# Patient Record
Sex: Female | Born: 1935 | Race: Black or African American | Hispanic: No | Smoking: Former smoker
Health system: Southern US, Community
[De-identification: ages and names within clinical notes are randomized; demographics above are authoritative.]

## PROBLEM LIST (undated history)

## (undated) DIAGNOSIS — F0390 Unspecified dementia without behavioral disturbance: Secondary | ICD-10-CM

## (undated) DIAGNOSIS — E876 Hypokalemia: Secondary | ICD-10-CM

## (undated) DIAGNOSIS — C3490 Malignant neoplasm of unspecified part of unspecified bronchus or lung: Secondary | ICD-10-CM

## (undated) DIAGNOSIS — M1990 Unspecified osteoarthritis, unspecified site: Secondary | ICD-10-CM

## (undated) DIAGNOSIS — I340 Nonrheumatic mitral (valve) insufficiency: Secondary | ICD-10-CM

## (undated) DIAGNOSIS — K5790 Diverticulosis of intestine, part unspecified, without perforation or abscess without bleeding: Secondary | ICD-10-CM

## (undated) DIAGNOSIS — I420 Dilated cardiomyopathy: Secondary | ICD-10-CM

## (undated) DIAGNOSIS — E785 Hyperlipidemia, unspecified: Secondary | ICD-10-CM

## (undated) DIAGNOSIS — I10 Essential (primary) hypertension: Secondary | ICD-10-CM

## (undated) DIAGNOSIS — I509 Heart failure, unspecified: Secondary | ICD-10-CM

## (undated) DIAGNOSIS — I2510 Atherosclerotic heart disease of native coronary artery without angina pectoris: Secondary | ICD-10-CM

## (undated) DIAGNOSIS — J309 Allergic rhinitis, unspecified: Secondary | ICD-10-CM

## (undated) DIAGNOSIS — R918 Other nonspecific abnormal finding of lung field: Secondary | ICD-10-CM

## (undated) DIAGNOSIS — M8580 Other specified disorders of bone density and structure, unspecified site: Secondary | ICD-10-CM

## (undated) HISTORY — PX: ABDOMINAL HYSTERECTOMY: SHX81

## (undated) HISTORY — PX: APPENDECTOMY: SHX54

## (undated) HISTORY — DX: Allergic rhinitis, unspecified: J30.9

## (undated) HISTORY — PX: TONSILLECTOMY: SUR1361

## (undated) HISTORY — DX: Dilated cardiomyopathy: I42.0

## (undated) HISTORY — DX: Other specified disorders of bone density and structure, unspecified site: M85.80

## (undated) HISTORY — DX: Nonrheumatic mitral (valve) insufficiency: I34.0

## (undated) HISTORY — DX: Diverticulosis of intestine, part unspecified, without perforation or abscess without bleeding: K57.90

## (undated) HISTORY — PX: EYE SURGERY: SHX253

## (undated) HISTORY — DX: Hypokalemia: E87.6

## (undated) HISTORY — PX: KNEE ARTHROSCOPY: SUR90

## (undated) HISTORY — DX: Atherosclerotic heart disease of native coronary artery without angina pectoris: I25.10

---

## 1999-07-23 ENCOUNTER — Encounter: Payer: Self-pay | Admitting: Emergency Medicine

## 1999-07-23 ENCOUNTER — Emergency Department (HOSPITAL_COMMUNITY): Admission: EM | Admit: 1999-07-23 | Discharge: 1999-07-23 | Payer: Self-pay | Admitting: Emergency Medicine

## 2001-11-17 ENCOUNTER — Encounter: Admission: RE | Admit: 2001-11-17 | Discharge: 2002-02-15 | Payer: Self-pay | Admitting: Family Medicine

## 2002-07-19 ENCOUNTER — Encounter: Payer: Self-pay | Admitting: Orthopaedic Surgery

## 2002-07-19 ENCOUNTER — Encounter: Admission: RE | Admit: 2002-07-19 | Discharge: 2002-07-19 | Payer: Self-pay | Admitting: Orthopaedic Surgery

## 2002-07-20 ENCOUNTER — Ambulatory Visit (HOSPITAL_BASED_OUTPATIENT_CLINIC_OR_DEPARTMENT_OTHER): Admission: RE | Admit: 2002-07-20 | Discharge: 2002-07-20 | Payer: Self-pay | Admitting: Orthopaedic Surgery

## 2005-01-09 ENCOUNTER — Ambulatory Visit: Payer: Self-pay | Admitting: Gastroenterology

## 2005-01-23 ENCOUNTER — Ambulatory Visit: Payer: Self-pay | Admitting: Gastroenterology

## 2008-01-22 ENCOUNTER — Inpatient Hospital Stay (HOSPITAL_COMMUNITY): Admission: AD | Admit: 2008-01-22 | Discharge: 2008-01-28 | Payer: Self-pay | Admitting: Cardiology

## 2008-02-22 ENCOUNTER — Encounter: Admission: RE | Admit: 2008-02-22 | Discharge: 2008-02-22 | Payer: Self-pay | Admitting: Internal Medicine

## 2008-03-16 ENCOUNTER — Emergency Department (HOSPITAL_COMMUNITY): Admission: EM | Admit: 2008-03-16 | Discharge: 2008-03-17 | Payer: Self-pay | Admitting: Emergency Medicine

## 2008-09-08 ENCOUNTER — Encounter: Admission: RE | Admit: 2008-09-08 | Discharge: 2008-09-08 | Payer: Self-pay | Admitting: Cardiology

## 2008-10-13 DIAGNOSIS — I10 Essential (primary) hypertension: Secondary | ICD-10-CM | POA: Insufficient documentation

## 2008-10-14 ENCOUNTER — Ambulatory Visit: Payer: Self-pay | Admitting: Critical Care Medicine

## 2008-10-14 DIAGNOSIS — J8410 Pulmonary fibrosis, unspecified: Secondary | ICD-10-CM

## 2008-10-14 DIAGNOSIS — E7800 Pure hypercholesterolemia, unspecified: Secondary | ICD-10-CM

## 2008-10-14 DIAGNOSIS — I509 Heart failure, unspecified: Secondary | ICD-10-CM | POA: Insufficient documentation

## 2008-10-14 DIAGNOSIS — E119 Type 2 diabetes mellitus without complications: Secondary | ICD-10-CM | POA: Insufficient documentation

## 2008-10-18 ENCOUNTER — Encounter: Payer: Self-pay | Admitting: Critical Care Medicine

## 2008-10-18 ENCOUNTER — Ambulatory Visit: Payer: Self-pay | Admitting: Critical Care Medicine

## 2008-10-18 ENCOUNTER — Ambulatory Visit: Admission: RE | Admit: 2008-10-18 | Discharge: 2008-10-18 | Payer: Self-pay | Admitting: Critical Care Medicine

## 2008-10-20 ENCOUNTER — Telehealth (INDEPENDENT_AMBULATORY_CARE_PROVIDER_SITE_OTHER): Payer: Self-pay | Admitting: *Deleted

## 2008-10-20 ENCOUNTER — Encounter: Payer: Self-pay | Admitting: Critical Care Medicine

## 2008-10-26 ENCOUNTER — Ambulatory Visit: Payer: Self-pay | Admitting: Critical Care Medicine

## 2008-12-14 ENCOUNTER — Encounter: Payer: Self-pay | Admitting: Critical Care Medicine

## 2008-12-14 ENCOUNTER — Ambulatory Visit: Payer: Self-pay | Admitting: Critical Care Medicine

## 2009-02-27 ENCOUNTER — Encounter: Admission: RE | Admit: 2009-02-27 | Discharge: 2009-02-27 | Payer: Self-pay | Admitting: Internal Medicine

## 2009-03-07 ENCOUNTER — Encounter: Admission: RE | Admit: 2009-03-07 | Discharge: 2009-03-07 | Payer: Self-pay | Admitting: Internal Medicine

## 2009-12-05 ENCOUNTER — Encounter (INDEPENDENT_AMBULATORY_CARE_PROVIDER_SITE_OTHER): Payer: Self-pay | Admitting: *Deleted

## 2009-12-28 ENCOUNTER — Encounter (INDEPENDENT_AMBULATORY_CARE_PROVIDER_SITE_OTHER): Payer: Self-pay | Admitting: *Deleted

## 2010-01-10 ENCOUNTER — Encounter (INDEPENDENT_AMBULATORY_CARE_PROVIDER_SITE_OTHER): Payer: Self-pay | Admitting: *Deleted

## 2010-01-15 ENCOUNTER — Ambulatory Visit: Payer: Self-pay | Admitting: Gastroenterology

## 2010-01-26 ENCOUNTER — Telehealth: Payer: Self-pay | Admitting: Gastroenterology

## 2010-01-26 ENCOUNTER — Ambulatory Visit: Payer: Self-pay | Admitting: Gastroenterology

## 2010-01-30 ENCOUNTER — Encounter: Payer: Self-pay | Admitting: Gastroenterology

## 2010-03-19 ENCOUNTER — Encounter: Admission: RE | Admit: 2010-03-19 | Discharge: 2010-03-19 | Payer: Self-pay | Admitting: Internal Medicine

## 2010-08-03 ENCOUNTER — Ambulatory Visit (HOSPITAL_COMMUNITY): Admission: RE | Admit: 2010-08-03 | Discharge: 2010-08-03 | Payer: Self-pay | Admitting: Pediatrics

## 2011-01-17 NOTE — Procedures (Signed)
Summary: Colonoscopy  Patient: Joan Hunter Note: All result statuses are Final unless otherwise noted.  Tests: (1) Colonoscopy (COL)   COL Colonoscopy           DONE (C)     [facility]     [address]     [place], [place]  [address]           COLONOSCOPY PROCEDURE REPORT           PATIENT:  Joan, Hunter  MR#:  [id number]     BIRTHDATE:  09/26/1927, 82 yrs. old  GENDER:  female           ENDOSCOPIST:  Barbette Hair. Arlyce Dice, MD     Referred by:           PROCEDURE DATE:  01/26/2010     PROCEDURE:  Colonoscopy with snare polypectomy     ASA CLASS:  Class II     INDICATIONS:  history of pre-cancerous (adenomatous) colon polyps,     family history of colon cancer mother           MEDICATIONS:   Fentanyl 50 mcg IV, Versed 5 mg IV           DESCRIPTION OF PROCEDURE:   After the risks benefits and     alternatives of the procedure were thoroughly explained, informed     consent was obtained.  Digital rectal exam was performed and     revealed no abnormalities.   The LB PCF-H180AL [id number] endoscope     was introduced through the anus and advanced to the cecum, which     was identified by the ileocecal valve, without limitations.  The     quality of the prep was excellent, using MoviPrep.  The instrument     was then slowly withdrawn as the colon was fully examined.     <<PROCEDUREIMAGES>>           FINDINGS:  A sessile polyp was found in the descending colon. It     was 1 cm in size. Polyp was snared, then cauterized with monopolar     cautery. Retrieval was successful (see image3). snare polyp     Moderate diverticulosis was found in the sigmoid colon (see     image5).  This was otherwise a normal examination of the colon     (see image1, image2, image4, image6, and image7).   Retroflexed     views in the rectum revealed no abnormalities.    The scope was     then withdrawn from the patient and the procedure completed.           COMPLICATIONS:  None        ENDOSCOPIC IMPRESSION:     1) 1 cm sessile polyp in the descending colon     2) Moderate diverticulosis in the sigmoid colon     3) Otherwise normal examination     RECOMMENDATIONS:     1) Given your significant family history of colon cancer, you     should have a repeat colonoscopy in 5 years           REPEAT EXAM:  In 5 year(s) for Colonoscopy.           ______________________________     Barbette Hair. Arlyce Dice, MD           CC:  Renford Dills, MD (correction)           n.  REVISED:  01/31/2010 04:35 PM     eSIGNED:   Barbette Hair. Keigan Girten at 01/31/2010 04:35 PM           Chrisandra Netters, [id number]  Note: An exclamation mark (!) indicates a result that was not dispersed into the flowsheet. Document Creation Date: 01/31/2010 4:35 PM _______________________________________________________________________  (1) Order result status: Final Collection or observation date-time: 01/26/2010 10:11 Requested date-time:  Receipt date-time:  Reported date-time:  Referring Physician:   Ordering Physician: Melvia Heaps [phone]) Specimen Source:  Source: Launa Grill Order Number: [id number] Lab site:

## 2011-01-17 NOTE — Letter (Signed)
Summary: Patient Notice- Polyp Results  [facility]  [address] [place], [place] [address]   Phone: [phone]  Fax: [phone]        January 30, 2010 MRN: [id number]    Joan Hunter [address] [place], [place]  [address]    Dear Ms. Fulgham,  I am pleased to inform you that the colon polyp(s) removed during your recent colonoscopy was (were) found to be benign (no cancer detected) upon pathologic examination.  I recommend you have a repeat colonoscopy examination in _5 years to look for recurrent polyps, as having colon polyps increases your risk for having recurrent polyps or even colon cancer in the future.  Should you develop new or worsening symptoms of abdominal pain, bowel habit changes or bleeding from the rectum or bowels, please schedule an evaluation with either your primary care physician or with me.  Additional information/recommendations:  __ No further action with gastroenterology is needed at this time. Please      follow-up with your primary care physician for your other healthcare      needs.  __ Please call [phone] to schedule a return visit to review your      situation.  __ Please keep your follow-up visit as already scheduled.  _x_ Continue treatment plan as outlined the day of your exam.  Please call us if you are having persistent problems or have questions about your condition that have not been fully answered at this time.  Sincerely,  Louis Meckel MD  This letter has been electronically signed by your physician.  Appended Document: Patient Notice- Polyp Results Letter mailed 2.17.11

## 2011-01-17 NOTE — Letter (Signed)
Summary: [facility] Instructions  [facility]  [address] [place], [place] [address]   Phone: [phone]  Fax: [phone]       Joan Hunter    24-May-1927    MRN: [id number]        Procedure Day /Date:  Friday 01/26/2010     Arrival Time:  8:30 am      Procedure Time: 9:30 am     Location of Procedure:                    _x _  [facility] (4th Floor)                        PREPARATION FOR COLONOSCOPY WITH MOVIPREP   Starting 5 days prior to your procedure 2/6 Sunday do not eat nuts, seeds, popcorn, corn, beans, peas,  salads, or any raw vegetables.  Do not take any fiber supplements (e.g. Metamucil, Citrucel, and Benefiber).  THE DAY BEFORE YOUR PROCEDURE         DATE: Thursday 2/10  1.  Drink clear liquids the entire day-NO SOLID FOOD  2.  Do not drink anything colored red or purple.  Avoid juices with pulp.  No orange juice.  3.  Drink at least 64 oz. (8 glasses) of fluid/clear liquids during the day to prevent dehydration and help the prep work efficiently.  CLEAR LIQUIDS INCLUDE: Water Jello Ice Popsicles Tea (sugar ok, no milk/cream) Powdered fruit flavored drinks Coffee (sugar ok, no milk/cream) Gatorade Juice: apple, white grape, white cranberry  Lemonade Clear bullion, consomm, broth Carbonated beverages (any kind) Strained chicken noodle soup Hard Candy                             4.  In the morning, mix first dose of MoviPrep solution:    Empty 1 Pouch A and 1 Pouch B into the disposable container    Add lukewarm drinking water to the top line of the container. Mix to dissolve    Refrigerate (mixed solution should be used within 24 hrs)  5.  Begin drinking the prep at 5:00 p.m. The MoviPrep container is divided by 4 marks.   Every 15 minutes drink the solution down to the next mark (approximately 8 oz) until the full liter is complete.   6.  Follow completed prep with 16 oz of clear liquid of your choice  (Nothing red or purple).  Continue to drink clear liquids until bedtime.  7.  Before going to bed, mix second dose of MoviPrep solution:    Empty 1 Pouch A and 1 Pouch B into the disposable container    Add lukewarm drinking water to the top line of the container. Mix to dissolve    Refrigerate  THE DAY OF YOUR PROCEDURE      DATE: Friday 2/11  Beginning at 4:30 a.m. (5 hours before procedure):         1. Every 15 minutes, drink the solution down to the next mark (approx 8 oz) until the full liter is complete.  2. Follow completed prep with 16 oz. of clear liquid of your choice.    3. You may drink clear liquids until 7:30 am (2 HOURS BEFORE PROCEDURE).   MEDICATION INSTRUCTIONS  Unless otherwise instructed, you should take regular prescription medications with a small sip of water   as early as possible the morning  of your procedure.  Diabetic patients - see separate instructions.  Additional medication instructions: Hold Furosemide the day of the procedure.         OTHER INSTRUCTIONS  You will need a responsible adult at least 75 years of age to accompany you and drive you home.   This person must remain in the waiting room during your procedure.  Wear loose fitting clothing that is easily removed.  Leave jewelry and other valuables at home.  However, you may wish to bring a book to read or  an iPod/MP3 player to listen to music as you wait for your procedure to start.  Remove all body piercing jewelry and leave at home.  Total time from sign-in until discharge is approximately 2-3 hours.  You should go home directly after your procedure and rest.  You can resume normal activities the  day after your procedure.  The day of your procedure you should not:   Drive   Make legal decisions   Operate machinery   Drink alcohol   Return to work  You will receive specific instructions about eating, activities and medications before you leave.    The above  instructions have been reviewed and explained to me by   Ezra Sites RN  January 15, 2010 4:13 PM    I fully understand and can verbalize these instructions _____________________________ Date _________

## 2011-01-17 NOTE — Letter (Signed)
Summary: Previsit letter  [facility]  [address] [place], [place] [address]   Phone: [phone]  Fax: [phone]       12/28/2009 MRN: [id number]  Joan Hunter [address] [place], [place]  [address]  Dear [name],  Welcome to the Gastroenterology Division at [facility].    You are scheduled to see a nurse for your pre-procedure visit on 01-15-10 at 4:00p.m. on the 3rd floor at [facility], [address]  We ask that you try to arrive at our office 15 minutes prior to your appointment time to allow for check-in.  Your nurse visit will consist of discussing your medical and surgical history, your immediate family medical history, and your medications.    Please bring a complete list of all your medications or, if you prefer, bring the medication bottles and we will list them.  We will need to be aware of both prescribed and over the counter drugs.  We will need to know exact dosage information as well.  If you are on blood thinners (Coumadin, Plavix, Aggrenox, Ticlid, etc.) please call our office today/prior to your appointment, as we need to consult with your physician about holding your medication.   Please be prepared to read and sign documents such as consent forms, a financial agreement, and acknowledgement forms.  If necessary, and with your consent, a friend or relative is welcome to sit-in on the nurse visit with you.  Please bring your insurance card so that we may make a copy of it.  If your insurance requires a referral to see a specialist, please bring your referral form from your primary care physician.  No co-pay is required for this nurse visit.     If you cannot keep your appointment, please call [phone] to cancel or reschedule prior to your appointment date.  This allows [place] the opportunity to schedule an appointment for another patient in need of care.    Thank you for choosing [facility] for your medical  needs.  We appreciate the opportunity to care for you.  Please visit [place] at our website  to learn more about our practice.                     Sincerely.                                                                                                                   The Gastroenterology Division

## 2011-01-17 NOTE — Procedures (Signed)
Summary: Colonoscopy  Patient: Joan Hunter Note: All result statuses are Final unless otherwise noted.  Tests: (1) Colonoscopy (COL)   COL Colonoscopy           DONE     [facility]     [address]     [place], [place]  [address]           COLONOSCOPY PROCEDURE REPORT           PATIENT:  Joan Hunter, Joan Hunter  MR#:  [id number]     BIRTHDATE:  [birth year], 82 yrs. old  GENDER:  female           ENDOSCOPIST:  Barbette Hair. Arlyce Dice, MD     Referred by:           PROCEDURE DATE:  01/26/2010     PROCEDURE:  Colonoscopy with snare polypectomy     ASA CLASS:  Class II     INDICATIONS:  history of pre-cancerous (adenomatous) colon polyps,     family history of colon cancer mother           MEDICATIONS:   Fentanyl 50 mcg IV, Versed 5 mg IV           DESCRIPTION OF PROCEDURE:   After the risks benefits and     alternatives of the procedure were thoroughly explained, informed     consent was obtained.  Digital rectal exam was performed and     revealed no abnormalities.   The LB PCF-H180AL [id number] endoscope     was introduced through the anus and advanced to the cecum, which     was identified by the ileocecal valve, without limitations.  The     quality of the prep was excellent, using MoviPrep.  The instrument     was then slowly withdrawn as the colon was fully examined.     <<PROCEDUREIMAGES>>           FINDINGS:  A sessile polyp was found in the descending colon. It     was 1 cm in size. Polyp was snared, then cauterized with monopolar     cautery. Retrieval was successful (see image3). snare polyp     Moderate diverticulosis was found in the sigmoid colon (see     image5).  This was otherwise a normal examination of the colon     (see image1, image2, image4, image6, and image7).   Retroflexed     views in the rectum revealed no abnormalities.    The scope was     then withdrawn from the patient and the procedure completed.           COMPLICATIONS:  None        ENDOSCOPIC IMPRESSION:     1) 1 cm sessile polyp in the descending colon     2) Moderate diverticulosis in the sigmoid colon     3) Otherwise normal examination     RECOMMENDATIONS:     1) Given your significant family history of colon cancer, you     should have a repeat colonoscopy in 5 years           REPEAT EXAM:  In 5 year(s) for Colonoscopy.           ______________________________     Barbette Hair. Arlyce Dice, MD           CC:  Clyda Greener, MD           n.     eSIGNED:  Barbette Hair. Shelia Magallon at 01/26/2010 10:16 AM           Chrisandra Netters, [id number]  Note: An exclamation mark (!) indicates a result that was not dispersed into the flowsheet. Document Creation Date: 01/26/2010 10:16 AM _______________________________________________________________________  (1) Order result status: Final Collection or observation date-time: 01/26/2010 10:11 Requested date-time:  Receipt date-time:  Reported date-time:  Referring Physician:   Ordering Physician: Melvia Heaps [phone]) Specimen Source:  Source: Launa Grill Order Number: [id number] Lab site:   Appended Document: Colonoscopy     Procedures Next Due Date:    Colonoscopy: 01/2015  Appended Document: Colonoscopy CC: Lajean Manes MD

## 2011-01-17 NOTE — Letter (Signed)
Summary: Diabetic Instructions  [facility]  [address] [place], [place] [address]   Phone: [phone]  Fax: [phone]    ASHMI BLAS 07-21-1927 MRN: [id number]   _  _   ORAL DIABETIC MEDICATION INSTRUCTIONS  The day before your procedure:   Take your diabetic pill as you do normally  The day of your procedure:   Do not take your diabetic pill    We will check your blood sugar levels during the admission process and again in Recovery before discharging you home  ________________________________________________________________________

## 2011-01-17 NOTE — Progress Notes (Signed)
Summary: Wrong PCP on her paperwork  Phone Note Call from Patient Call back at Home Phone [phone]   Call For: Dr Arlyce Dice Summary of Call: We have the wrong Doctor listed as her primry care on all her discharge paperwork. It really is Dr Renford Dills. Initial call taken by: Leanor Kail [facility],  January 26, 2010 12:54 PM  Follow-up for Phone Call        Spoke with pt and stated that paperwork would be corrected. Follow-up by: Karl Bales RN,  January 26, 2010 12:57 PM

## 2011-01-17 NOTE — Letter (Signed)
Summary: Patient Notice- Polyp Results  [facility]  [address] [place], [place] [address]   Phone: [phone]  Fax: [phone]        January 30, 2010 MRN: [id number]    Joan Hunter [address] [place], [place]  [address]    Dear Ms. Teagle,  I am pleased to inform you that the colon polyp(s) removed during your recent colonoscopy was (were) found to be benign (no cancer detected) upon pathologic examination.  I recommend you have a repeat colonoscopy examination in 5_ years to look for recurrent polyps, as having colon polyps increases your risk for having recurrent polyps or even colon cancer in the future.  Should you develop new or worsening symptoms of abdominal pain, bowel habit changes or bleeding from the rectum or bowels, please schedule an evaluation with either your primary care physician or with me.  Additional information/recommendations:  __ No further action with gastroenterology is needed at this time. Please      follow-up with your primary care physician for your other healthcare      needs.  __ Please call [phone] to schedule a return visit to review your      situation.  __ Please keep your follow-up visit as already scheduled.  _x_ Continue treatment plan as outlined the day of your exam.  Please call us if you are having persistent problems or have questions about your condition that have not been fully answered at this time.  Sincerely,  Louis Meckel MD  This letter has been electronically signed by your physician.

## 2011-01-17 NOTE — Miscellaneous (Signed)
Summary: [facility]  Clinical Lists Changes  Medications: Added new medication of MOVIPREP 100 GM  SOLR (PEG-KCL-NACL-NASULF-NA ASC-C) As per prep instructions. - Signed Rx of MOVIPREP 100 GM  SOLR (PEG-KCL-NACL-NASULF-NA ASC-C) As per prep instructions.;  #1 x 0;  Signed;  Entered by: Ezra Sites RN;  Authorized by: Louis Meckel MD;  Method used: Electronically to [facility] [id number]*, [address] [place], [place]  [address], Ph: [phone], Fax: [phone] Allergies: Changed allergy or adverse reaction from PCN to PCN Changed allergy or adverse reaction from * AVELOX to * AVELOX Changed allergy or adverse reaction from PREDNISONE to PREDNISONE    Prescriptions: MOVIPREP 100 GM  SOLR (PEG-KCL-NACL-NASULF-NA ASC-C) As per prep instructions.  #1 x 0   Entered by:   Ezra Sites RN   Authorized by:   Louis Meckel MD   Signed by:   Ezra Sites RN on 01/15/2010   Method used:   Electronically to        [facility] [phone]* (retail)       [address]       [place], [place]  [address]       Ph: [phone]       Fax: [phone]   RxID:   [id number]

## 2011-03-06 LAB — GLUCOSE, CAPILLARY: Glucose-Capillary: 140 mg/dL — ABNORMAL HIGH (ref 70–99)

## 2011-03-26 ENCOUNTER — Other Ambulatory Visit: Payer: Self-pay | Admitting: Internal Medicine

## 2011-03-26 DIAGNOSIS — Z1231 Encounter for screening mammogram for malignant neoplasm of breast: Secondary | ICD-10-CM

## 2011-04-02 ENCOUNTER — Ambulatory Visit
Admission: RE | Admit: 2011-04-02 | Discharge: 2011-04-02 | Disposition: A | Discharge status: Home / Self Care | Payer: Medicare Other | Source: Ambulatory Visit | Attending: Internal Medicine | Admitting: Internal Medicine

## 2011-04-02 DIAGNOSIS — Z1231 Encounter for screening mammogram for malignant neoplasm of breast: Secondary | ICD-10-CM

## 2011-04-30 NOTE — Discharge Summary (Signed)
Joan Hunter, Joan Hunter NO.:  [id number]   MEDICAL RECORD NO.:  [id number]          PATIENT TYPE:  INP   LOCATION:  3708                         FACILITY:  [facility]   PHYSICIAN:  Jake Bathe, MD      DATE OF BIRTH:  1927-05-03   DATE OF ADMISSION:  01/22/2008  DATE OF DISCHARGE:  01/28/2008                               DISCHARGE SUMMARY   DISCHARGE DIAGNOSES:  1. Chest pain, resolved.  2. Nonobstructive coronary artery disease.  3. Severe left ventricular dysfunction, nonischemic in nature,      ejection fraction 15%.  4. Renal insufficiency.  5. Diabetes mellitus.  6. Hypertension.  7. Left knee arthritis.  8. Allergy to penicillin.  9. Long-term medication use.   HISTORY:  [name] is a 77 year old female with a history of  diabetes, hypertension who has noted increased shortness of breath so  much to the point that she has to sleep sitting up in chair, consistent  with New York Heart Association class III symptoms. She was then  admitted to [facility] with acute systolic heart failure.  An echo in  the office just prior to her admission showed globally decreased  ejection fraction with an EF of approximately 20% with moderate mitral  regurgitation.   She was aggressively diuresed with Lasix intravenously.   Lab studies during her hospital stay include TSH 1.702, total  cholesterol 184, triglycerides 99, HDL 25, LDL 139. Cardiac enzymes  showed normal CK-MB's with a maximum troponin of 0.14 which can be  consistent with volume overload. Sodium 133, potassium 4.5, BUN 35,  creatinine 1.24. White count 11.5, hemoglobin 12.7, hematocrit 30.2,  platelets 352. Chest x-ray:  Cardiomegaly with improved pulmonary venous  congestion suspicious prior to her discharge. Her EKG showed sinus  tachycardia rate 102 with T-wave inversion in the lateral leads.   She ultimately underwent cardiac catheterization on January 25, 2008 and  was found to have the  following:  She had a mid 80% stenosis, RCA mid  20% stenosis. Renal angiogram showed no renal artery stenosis. Aortogram  with no aneurysm. LV gram showed global hypokinesis with an EF of 15-  20%.  The patient was felt to have severe LV dysfunction out of  proportion to her coronary artery disease.   PLAN:  Medical management.  The patient's medications were adjusted and  by January 28, 2008 the patient was felt to be ready for discharge to  home.   DISCHARGE MEDICATIONS:  Baby aspirin daily, Glucotrol 5 mg a day,  lisinopril 10 mg twice a day, potassium 10 mEq a day, Lopressor 100 mg  twice a day, Zocor 10 mg daily, Lasix 20 mg a day.   Remain on a low-sodium heart-healthy diet.  Follow up with Dr. Anne Fu  with lab work on February 03, 2008 at 11:30 a.m.  She showed interest in  becoming a patient of the [facility] internal medicine clinic and I have  given her the phone number to schedule an appointment.   Of note, we have asked the patient to stop taking Norvasc.      Joan Hunter  Joan Hunter.      Jake Bathe, MD  Electronically Signed    LB/MEDQ  D:  02/11/2008  T:  02/12/2008  Job:  [id number]   cc:   Jake Bathe, MD  [facility] Internal Medicine

## 2011-04-30 NOTE — Cardiovascular Report (Signed)
NAMEJANEYA, DEYO NO.:  [id number]   MEDICAL RECORD NO.:  [id number]          PATIENT TYPE:  INP   LOCATION:  3708                         FACILITY:  [facility]   PHYSICIAN:  Jake Bathe, MD      DATE OF BIRTH:  [birth year]   DATE OF PROCEDURE:  01/25/2008  DATE OF DISCHARGE:                            CARDIAC CATHETERIZATION   PROCEDURES:  1. Left heart catheterization.  2. Selective coronary angiography.  3. Left ventriculogram.  4. Abdominal aortogram.   INDICATIONS:  A 77 year old female with acute systolic heart failure,  newly diagnosed.  Cardiac catheterization was done for etiology of heart  failure.  Diabetes and hypertension.   PROCEDURE DETAILS:  Informed consent was obtained by the patient.  Daughter was present.  Risks of stroke, heart attack, and death were  explained at length.  The patient was placed on the catheterization  table, prepped in a sterile fashion.  A 1% lidocaine was used to  infiltrate the right groin for local anesthesia.  Using the modified  Seldinger technique, a 5-French sheath was placed in the right femoral  artery after visualization of the femoral head by fluoroscopy.  A  Judkins left #4 catheter was then selectively cannulated into the left  main artery.  Multiple views with hand injection were obtained with  Omnipaque.  This catheter was exchanged for a Judkins right #4 catheter,  that was used to selectively cannulate the right coronary artery.  Multiple views of Omnipaque were obtained.  Catheter was then exchanged  for an angled pigtail which was used to cross the aortic valve into the  left ventricle.  Hemodynamics were obtained.  A left ventriculogram  utilizing 30 mL of Omnipaque was obtained in the RAO position under  power injection.  Pullback was then obtained.  Hemodynamics recorded.  A  nonselective renal angiogram/abdominal aortogram was performed utilizing  38 mL of dye.  After the procedure was complete,  sheath was removed.  Manual compression held.  No hematoma present.  The patient tolerated  the procedure well, hemodynamically stable.  Findings were discussed  with both patient and her family.   FINDINGS:  1. Left main artery - Large caliber, no significant disease.  2. Left anterior descending artery - There is proximal calcification.      There is one large bifurcating diagonal.  There is 60% ostial LAD      stenosis, as well as 40% LAD stenosis at the bifurcation of the      large diagonal branch .  The LAD then continues to wrap around the      apex.  3. Left circumflex artery - There are two  small obtuse marginal      branches.  The main circumflex artery is a large caliber vessel      with a focal 70% to 80% stenosis in the midsection,  napkin ring.  4. Right coronary artery - This is the dominant vessel giving off the      PDA.  There is mid 20% stenosis.  5. Nonselective renal arteries - There is no evidence of renal artery  stenosis bilaterally.  There is no abdominal aortic aneurysm.  6. Left ventriculogram - There is severe global hypokineses of the      left ventricle with ejection fraction estimated 15% to 20%.  No      significant mitral regurgitation detected, although echo did show      moderate.  Left ventricular pressures were 140 systolic with a left      ventricular end-diastolic pressure of 10 mmHg.  Aortic pressure was      140/82 with a mean of 100 - There was no gradient across the aortic      valve.  Of note, there was beat-to-beat variation in her aortic and      left ventricular pressures significant for pulsus alternans, which      can be seen in severe LV systolic dysfunction.   IMPRESSION:  1. Moderate coronary artery disease with most notable two-vessel      disease - ostial left anterior descending 60% and mid circumflex      70% to 80% stenosis.  2. Severe left ventricular systolic dysfunction with ejection fraction      of 15% to 20% global.   No evidence of thrombus in the apex.  Pulsus      alternans present.  3. No significant renal artery stenosis.   PLAN:  It does appear that her global LV systolic dysfunction is out of  proportion to her coronary artery disease.  I will continue to  aggressively titrate her heart failure medicines.  LVEDP was reassuring.  She was able to lie flat on the catheterization table without problems.  Likely, euvolemic.      Jake Bathe, MD  Electronically Signed     MCS/MEDQ  D:  01/25/2008  T:  01/26/2008  Job:  [id number]   cc:   Thomos Lemons. Lazarus Salines, M.D.

## 2011-04-30 NOTE — H&P (Signed)
NAMETASSIE, POLLETT NO.:  [id number]   MEDICAL RECORD NO.:  [id number]          PATIENT TYPE:  INP   LOCATION:  3708                         FACILITY:  [facility]   PHYSICIAN:  Jake Bathe, MD      DATE OF BIRTH:  Apr 17, 1927   DATE OF ADMISSION:  01/22/2008  DATE OF DISCHARGE:                              HISTORY & PHYSICAL   PRIMARY CARE PHYSICIAN:  Dr. Bruna Potter.   CHIEF COMPLAINT:  Shortness of breath.   HISTORY OF PRESENT ILLNESS:  A 77 year old female with diabetes,  hypertension who since Wednesday has had increased shortness of breath.  Last night had to sit up back to sleep sitting up in a chair.  Has had  New York Heart Association Class III symptoms.  Denies any chest pain,  palpitations, syncope, presyncope, weight gain, edema or recent dietary  indiscretions.  Denies any prior heart history.  She was seen earlier  today by Dr. Lazarus Salines at [facility] and I was asked to evaluate her given  her symptoms.   PAST MEDICAL HISTORY:  1. Diabetes - on glipizide 5 mg daily.  2. Hypertension - on amlodipine 5 mg daily.  3. Left knee osteoarthritis.   ALLERGIES:  PENICILLIN.   MEDICATIONS:  1. Glipizide 5 mg once daily.  2. Amlodipine 5 mg once daily.  3. Aspirin 81 mg once daily.   FAMILY HISTORY:  No early family history of coronary artery disease.  Her brother who was nine years younger than her has died.  He had  coronary artery disease.   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  She denies any tobacco or  alcohol use or illicit drug use.   REVIEW OF SYSTEMS:  Unless explained above, all other 12 review of  systems negative.  She denied any recent bleeding, recent viral  illnesses.  No recent travel.  No recent chest pain.   PHYSICAL EXAMINATION:  VITAL SIGNS:  Weight 156.2 pounds, height 5 feet  6, pulse 132, blood pressure 170/110, on repeat 170/100.  GENERAL:  Alert and  oriented x3, in no acute distress.  Does not appear  to have any labored breathing at rest.  Here with her daughter.  HEENT:  Eyes:  Well perfused conjunctivae status post cataract surgery.  No scleral icterus.  Mucous membranes moist.  NECK:  No carotid bruits.  JVD appreciated to jaw line.  CARDIOVASCULAR:  Tachycardic, regular rhythm.  Laterally displaced PMI.  LUNGS:  Mild crackles heard at bases.  Otherwise, clear.  Kyphosis.  ABDOMEN:  Soft, nontender, normoactive bowel sounds.  No bruits.  EXTREMITIES:  Trace lower extremity edema.  Pulses difficult to palpate,  2+ radial pulses.  NEUROLOGICAL:  Nonfocal, normal gait.  No tremors.  SKIN:  Warm, dry and intact.  No rashes.   LABORATORY DATA:  ECG obtained from Dr. Raye Sorrow encounter demonstrates  sinus tachycardia with marked LVH with repolarization abnormality.  No Q  waves appreciated.  Lab work pending.  Chest x-ray personally reviewed  shows mild cardiomegaly with some increase  in vascular markings.  Mild  failure.  Echocardiogram performed stat in the office shows globally  decreased ejection fraction of approximately 20% with moderate mitral  regurgitation.  Will examine echo in more detail further.   ASSESSMENT/PLAN:  A 77 year old female with diabetes, hypertension and  newly discovered LV systolic dysfunction and acute systolic heart  failure with tachycardia.  1. Acute systolic heart failure.  I will admit on telemetry.  Will      check chemistries and CBC.  Will check TSH, check BNP.  Her weight      currently is 156 pounds.  I will start Furosemide 40 mg IV twice a      day.  Will also start metoprolol 25 mg q.6 h, withhold parameters      given her tachycardia and hypertension.  Depending on her      creatinine, will either start hydralazine or ace inhibitor for      afterload reduction, aspirin 81 mg.  Will eventually, once      compensated, need heart catheterization for etiology of heart      failure.  2.  Hypertension - starting metoprolol and will add antihypertensives      as above.  Possible etiology of heart failure.  3. Diabetes mellitus - will place on insulin sliding scale, monitor      glucoses.      Jake Bathe, MD  Electronically Signed     MCS/MEDQ  D:  01/22/2008  T:  01/22/2008  Job:  [id number]   cc:   Thomos Lemons. Lazarus Salines, M.D.

## 2011-04-30 NOTE — Op Note (Signed)
NAMEFRANCI, OSHANA NO.:  [id number]   MEDICAL RECORD NO.:  [id number]          PATIENT TYPE:  OUT   LOCATION:  CARD                         FACILITY:  [facility]   PHYSICIAN:  Charlcie Cradle. Delford Field, MD, FCCPDATE OF BIRTH:  March 23, 1927   DATE OF PROCEDURE:  10/18/2008  DATE OF DISCHARGE:                               OPERATIVE REPORT   PROCEDURE:  Bronchoscopy.   CHIEF COMPLAINT:  Evaluate bilateral pulmonary infiltrates.   OPERATOR:  Shan Levans, M.D.   ANESTHESIA:  1% Xylocaine local.   PREMEDICATION:  Fentanyl 30 mcg, Versed 3 mg IV push.   PROCEDURE:  The Pentax video bronchoscope was introduced via the right  naris.  The upper airways were visualized and were unremarkable.  The  entire tracheobronchial tree was visualized, showed diffuse  tracheobronchitis but no endobronchial lesions were seen.  Attention was  then paid to the right lower lobe.  Bronchoalveolar lavage x 120 mL was  infused in the right middle lobe.  Approximately 40 mL returned.  Transbronchial biopsies times five were obtained right middle lobe.  Complications were none.   IMPRESSION:  Bilateral pulmonary infiltrates, evaluate for cause.   RECOMMENDATIONS:  Follow up pathology and microbiology.      Charlcie Cradle Delford Field, MD, [facility]  Electronically Signed     PEW/MEDQ  D:  10/18/2008  T:  10/19/2008  Job:  [id number]

## 2011-05-03 NOTE — Op Note (Signed)
NAMEALESSANDRIA, HENKEN NO.:  [id number]   MEDICAL RECORD NO.:  [id number]                   PATIENT TYPE:   LOCATION:                                       FACILITY:   PHYSICIAN:  Lubertha Basque. Jerl Santos, M.D.             DATE OF BIRTH:   DATE OF PROCEDURE:  07/20/2002  DATE OF DISCHARGE:                                 OPERATIVE REPORT   PREOPERATIVE DIAGNOSES:  1. Left knee torn medial meniscus.  2. Left knee degenerative joint disease.   POSTOPERATIVE DIAGNOSES:  1. Left knee torn medial meniscus.  2. Left knee degenerative joint disease.   PROCEDURES:  1. Left knee partial medial meniscectomy.  2. Left knee chondroplasty of patellofemoral.   ANESTHESIA:  General.   ATTENDING SURGEON:  Lubertha Basque. Jerl Santos, M.D.   ASSISTANT:  Prince Rome, P.A.   INDICATION FOR PROCEDURE:  The patient is a 77 year old woman who I have  followed for years for bilateral knee pain.  She is about five or six years  from a right knee arthroscopy and at this point is offered the same  procedure on the left side which had failed various oral anti-  inflammatories, multiple injections, and bracing.  The procedure was  discussed with the patient.  Informed operative procedure was obtained after  a discussion of possible complications, reaction to anesthesia and  infection.   DESCRIPTION OF PROCEDURE:  The patient was seen in the operating suite where  a general anesthetic was applied without difficulty.  She was positioned  supine and prepped and draped in the normal sterile fashion.  After  insertion of preoperative antibiotics, arthroscopy of the left knee was  performed through two inferior portals.  The suprapatellar pouch was benign  while, in the patellofemoral joint did, there was some grade 3 breakdown  centrally which was addressed with a thorough chondroplasty.  The knee cap  actually tracked fairly well.   In the medial compartment, she had a flap  tear of the middle and posterior  horns addressed with a partial medial meniscectomy, taking about 15% of the  structure back to a stable rim.  There were some mild degenerative changes  in the medial compartment addressed with a brief chondroplasty.   The lateral compartment was completely benign with no evidence of meniscal  articular cartilage injury.  The ACL and PCL were intact.  The knee was  thoroughly irrigated at the end of the case followed by placement of  Marcaine with epinephrine and morphine plus Depo-Medrol.  Adaptic was placed  over the portals followed by a dry gauze and a loose Ace wrap.   ESTIMATED BLOOD LOSS AND INTRAOPERATIVE FLUID:  Obtained from the anesthesia  records.   DISPOSITION:  The patient was extubated in the operating room and taken to  the recovery room in stable condition.   PLAN:  Plans were for her to go home the same  day and followup in the office  in one week.  I will contact her by phone tonight.                                               Lubertha Basque Jerl Santos, M.D.    PGD/MEDQ  D:  07/20/2002  T:  07/23/2002  Job:  [id number]

## 2011-08-29 ENCOUNTER — Emergency Department (HOSPITAL_COMMUNITY)
Admission: EM | Admit: 2011-08-29 | Discharge: 2011-08-29 | Disposition: A | Discharge status: Home / Self Care | Payer: Medicare Other | Attending: Emergency Medicine | Admitting: Emergency Medicine

## 2011-08-29 ENCOUNTER — Emergency Department (HOSPITAL_COMMUNITY): Payer: Medicare Other

## 2011-08-29 DIAGNOSIS — W010XXA Fall on same level from slipping, tripping and stumbling without subsequent striking against object, initial encounter: Secondary | ICD-10-CM | POA: Insufficient documentation

## 2011-08-29 DIAGNOSIS — H1130 Conjunctival hemorrhage, unspecified eye: Secondary | ICD-10-CM | POA: Insufficient documentation

## 2011-08-29 DIAGNOSIS — R0989 Other specified symptoms and signs involving the circulatory and respiratory systems: Secondary | ICD-10-CM | POA: Insufficient documentation

## 2011-08-29 DIAGNOSIS — R059 Cough, unspecified: Secondary | ICD-10-CM | POA: Insufficient documentation

## 2011-08-29 DIAGNOSIS — R05 Cough: Secondary | ICD-10-CM | POA: Insufficient documentation

## 2011-08-29 DIAGNOSIS — E119 Type 2 diabetes mellitus without complications: Secondary | ICD-10-CM | POA: Insufficient documentation

## 2011-08-29 DIAGNOSIS — S8000XA Contusion of unspecified knee, initial encounter: Secondary | ICD-10-CM | POA: Insufficient documentation

## 2011-08-29 DIAGNOSIS — I10 Essential (primary) hypertension: Secondary | ICD-10-CM | POA: Insufficient documentation

## 2011-08-29 DIAGNOSIS — S40019A Contusion of unspecified shoulder, initial encounter: Secondary | ICD-10-CM | POA: Insufficient documentation

## 2011-08-29 DIAGNOSIS — Y92009 Unspecified place in unspecified non-institutional (private) residence as the place of occurrence of the external cause: Secondary | ICD-10-CM | POA: Insufficient documentation

## 2011-08-29 DIAGNOSIS — I517 Cardiomegaly: Secondary | ICD-10-CM | POA: Insufficient documentation

## 2011-09-06 LAB — DIFFERENTIAL
Basophils Absolute: 0
Lymphocytes Relative: 11 — ABNORMAL LOW
Lymphs Abs: 1.1
Neutro Abs: 7.7

## 2011-09-06 LAB — BASIC METABOLIC PANEL
BUN: 10
BUN: 12
BUN: 18
BUN: 29 — ABNORMAL HIGH
BUN: 9
CO2: 25
CO2: 26
CO2: 28
CO2: 29
Calcium: 8.3 — ABNORMAL LOW
Calcium: 8.5
Calcium: 8.6
Calcium: 9.2
Chloride: 102
Chloride: 102
Chloride: 102
Chloride: 97
Chloride: 98
Creatinine, Ser: 0.81
Creatinine, Ser: 0.83
Creatinine, Ser: 1.1
Creatinine, Ser: 1.24 — ABNORMAL HIGH
Creatinine, Ser: 1.47 — ABNORMAL HIGH
GFR calc Af Amer: 42 — ABNORMAL LOW
GFR calc Af Amer: 51 — ABNORMAL LOW
GFR calc Af Amer: 58 — ABNORMAL LOW
GFR calc Af Amer: 59 — ABNORMAL LOW
GFR calc non Af Amer: 49 — ABNORMAL LOW
GFR calc non Af Amer: >60
Glucose, Bld: 102 — ABNORMAL HIGH
Glucose, Bld: 109 — ABNORMAL HIGH
Glucose, Bld: 122 — ABNORMAL HIGH
Glucose, Bld: 173 — ABNORMAL HIGH
Potassium: 4.6
Potassium: 4.6
Potassium: 4.7
Sodium: 133 — ABNORMAL LOW
Sodium: 137

## 2011-09-06 LAB — COMPREHENSIVE METABOLIC PANEL
Albumin: 2.8 — ABNORMAL LOW
BUN: 12
CO2: 23
Calcium: 8.3 — ABNORMAL LOW
Chloride: 107
Creatinine, Ser: 0.63
GFR calc non Af Amer: >60
Total Bilirubin: 0.6

## 2011-09-06 LAB — CBC
HCT: 32.5 — ABNORMAL LOW
HCT: 38.2
Hemoglobin: 12.7
MCHC: 33.1
MCHC: 33.5
MCV: 85.5
MCV: 86.1
Platelets: 238
Platelets: 242
RBC: 4.44
RDW: 15.1
RDW: 15.1
WBC: 9.6
WBC: 9.8

## 2011-09-06 LAB — B-NATRIURETIC PEPTIDE (CONVERTED LAB)
Pro B Natriuretic peptide (BNP): 844 — ABNORMAL HIGH
Pro B Natriuretic peptide (BNP): 943 — ABNORMAL HIGH

## 2011-09-06 LAB — CARDIAC PANEL(CRET KIN+CKTOT+MB+TROPI)
Relative Index: INVALID
Total CK: 106
Troponin I: 0.1 — ABNORMAL HIGH
Troponin I: 0.11 — ABNORMAL HIGH
Troponin I: 0.14 — ABNORMAL HIGH

## 2011-09-06 LAB — PROTIME-INR
INR: 1.1
Prothrombin Time: 14.8

## 2011-09-06 LAB — APTT: aPTT: 42 — ABNORMAL HIGH

## 2011-09-06 LAB — LIPID PANEL
Cholesterol: 184
HDL: 25 — ABNORMAL LOW
LDL Cholesterol: 139 — ABNORMAL HIGH
Total CHOL/HDL Ratio: 7.4
Triglycerides: 99

## 2011-09-06 LAB — HEMOGLOBIN A1C: Mean Plasma Glucose: 140

## 2011-09-10 LAB — URIC ACID: Uric Acid, Serum: 5.5

## 2011-09-10 LAB — CBC
HCT: 34.6 — ABNORMAL LOW
Hemoglobin: 11.3 — ABNORMAL LOW
MCHC: 32.7
Platelets: 219
RDW: 15.8 — ABNORMAL HIGH

## 2011-09-10 LAB — BASIC METABOLIC PANEL
BUN: 14
CO2: 27
Calcium: 8.4
GFR calc non Af Amer: >60
Glucose, Bld: 101 — ABNORMAL HIGH
Sodium: 134 — ABNORMAL LOW

## 2011-09-10 LAB — DIFFERENTIAL
Basophils Absolute: 0
Basophils Relative: 0
Eosinophils Relative: 2
Monocytes Absolute: 0.8
Neutro Abs: 6.6

## 2011-09-17 LAB — CULTURE, RESPIRATORY W GRAM STAIN

## 2011-09-17 LAB — AFB CULTURE WITH SMEAR (NOT AT ARMC): Acid Fast Smear: NONE SEEN

## 2011-09-17 LAB — BODY FLUID CELL COUNT WITH DIFFERENTIAL: Total Nucleated Cell Count, Fluid: 200 cu mm (ref 0–1000)

## 2011-09-17 LAB — FUNGUS CULTURE W SMEAR

## 2011-09-17 LAB — PATHOLOGIST SMEAR REVIEW

## 2012-03-05 ENCOUNTER — Other Ambulatory Visit: Payer: Self-pay | Admitting: Internal Medicine

## 2012-03-05 DIAGNOSIS — Z1231 Encounter for screening mammogram for malignant neoplasm of breast: Secondary | ICD-10-CM

## 2012-04-02 ENCOUNTER — Ambulatory Visit
Admission: RE | Admit: 2012-04-02 | Discharge: 2012-04-02 | Disposition: A | Discharge status: Home / Self Care | Payer: Medicare Other | Source: Ambulatory Visit | Attending: Internal Medicine | Admitting: Internal Medicine

## 2012-04-02 DIAGNOSIS — Z1231 Encounter for screening mammogram for malignant neoplasm of breast: Secondary | ICD-10-CM

## 2012-06-08 ENCOUNTER — Other Ambulatory Visit: Payer: Self-pay | Admitting: Orthopaedic Surgery

## 2012-06-09 ENCOUNTER — Other Ambulatory Visit: Payer: Self-pay | Admitting: Orthopaedic Surgery

## 2012-06-15 ENCOUNTER — Encounter (HOSPITAL_COMMUNITY)
Admission: RE | Admit: 2012-06-15 | Discharge: 2012-06-15 | Disposition: A | Discharge status: Home / Self Care | Payer: Medicare Other | Source: Ambulatory Visit | Attending: Orthopaedic Surgery | Admitting: Orthopaedic Surgery

## 2012-06-15 ENCOUNTER — Encounter (HOSPITAL_COMMUNITY): Payer: Self-pay

## 2012-06-15 ENCOUNTER — Ambulatory Visit (HOSPITAL_COMMUNITY)
Admission: RE | Admit: 2012-06-15 | Discharge: 2012-06-15 | Disposition: A | Discharge status: Home / Self Care | Payer: Medicare Other | Source: Ambulatory Visit | Attending: Orthopaedic Surgery | Admitting: Orthopaedic Surgery

## 2012-06-15 ENCOUNTER — Other Ambulatory Visit (HOSPITAL_COMMUNITY): Payer: Medicare Other

## 2012-06-15 ENCOUNTER — Other Ambulatory Visit (HOSPITAL_COMMUNITY): Payer: Self-pay | Admitting: Orthopaedic Surgery

## 2012-06-15 DIAGNOSIS — K449 Diaphragmatic hernia without obstruction or gangrene: Secondary | ICD-10-CM | POA: Insufficient documentation

## 2012-06-15 DIAGNOSIS — Z01818 Encounter for other preprocedural examination: Secondary | ICD-10-CM | POA: Insufficient documentation

## 2012-06-15 DIAGNOSIS — Z01810 Encounter for preprocedural cardiovascular examination: Secondary | ICD-10-CM | POA: Insufficient documentation

## 2012-06-15 DIAGNOSIS — I517 Cardiomegaly: Secondary | ICD-10-CM | POA: Insufficient documentation

## 2012-06-15 DIAGNOSIS — Z01812 Encounter for preprocedural laboratory examination: Secondary | ICD-10-CM | POA: Insufficient documentation

## 2012-06-15 DIAGNOSIS — R911 Solitary pulmonary nodule: Secondary | ICD-10-CM | POA: Insufficient documentation

## 2012-06-15 DIAGNOSIS — R9389 Abnormal findings on diagnostic imaging of other specified body structures: Secondary | ICD-10-CM

## 2012-06-15 HISTORY — DX: Hyperlipidemia, unspecified: E78.5

## 2012-06-15 HISTORY — DX: Essential (primary) hypertension: I10

## 2012-06-15 HISTORY — DX: Heart failure, unspecified: I50.9

## 2012-06-15 HISTORY — DX: Unspecified osteoarthritis, unspecified site: M19.90

## 2012-06-15 LAB — URINALYSIS, ROUTINE W REFLEX MICROSCOPIC
Glucose, UA: NEGATIVE mg/dL
Ketones, ur: NEGATIVE mg/dL
Nitrite: POSITIVE — AB
Protein, ur: NEGATIVE mg/dL
pH: 7 (ref 5.0–8.0)

## 2012-06-15 LAB — CBC
HCT: 38.3 % (ref 36.0–46.0)
Hemoglobin: 12.4 g/dL (ref 12.0–15.0)
MCH: 28.6 pg (ref 26.0–34.0)
MCHC: 32.4 g/dL (ref 30.0–36.0)
MCV: 88.2 fL (ref 78.0–100.0)
RBC: 4.34 MIL/uL (ref 3.87–5.11)

## 2012-06-15 LAB — COMPREHENSIVE METABOLIC PANEL
AST: 20 U/L (ref 0–37)
CO2: 25 mEq/L (ref 19–32)
Calcium: 9.3 mg/dL (ref 8.4–10.5)
Creatinine, Ser: 0.8 mg/dL (ref 0.50–1.10)
GFR calc non Af Amer: 66 mL/min — ABNORMAL LOW (ref >90)
Total Protein: 7.4 g/dL (ref 6.0–8.3)

## 2012-06-15 LAB — DIFFERENTIAL
Basophils Relative: 0 % (ref 0–1)
Eosinophils Absolute: 0.1 10*3/uL (ref 0.0–0.7)
Lymphs Abs: 1.9 10*3/uL (ref 0.7–4.0)
Monocytes Absolute: 0.7 10*3/uL (ref 0.1–1.0)
Monocytes Relative: 8 % (ref 3–12)

## 2012-06-15 LAB — LIPID PANEL
HDL: 62 mg/dL (ref >39)
LDL Cholesterol: 64 mg/dL (ref 0–99)
Total CHOL/HDL Ratio: 2.3 RATIO
Triglycerides: 83 mg/dL (ref <150)
VLDL: 17 mg/dL (ref 0–40)

## 2012-06-15 LAB — HEMOGLOBIN A1C: Mean Plasma Glucose: 128 mg/dL — ABNORMAL HIGH (ref <117)

## 2012-06-15 LAB — URINE MICROSCOPIC-ADD ON

## 2012-06-15 LAB — TYPE AND SCREEN

## 2012-06-15 LAB — SURGICAL PCR SCREEN: MRSA, PCR: NEGATIVE

## 2012-06-15 LAB — ABO/RH: ABO/RH(D): O POS

## 2012-06-15 NOTE — Pre-Procedure Instructions (Signed)
20 Joan Hunter   06/15/2012   Your procedure is scheduled on:  July 9TH, TUESDAY  Report to [facility] at 9:00 AM.   Call this number if you have problems the morning of surgery: [phone]   Remember:   Do not eat food or drink any liquids :After Midnight Monday .    Take these medicines the morning of surgery with A SIP OF WATER: Amlodipine, Carvedilol              Gabapentin if needed  (SHE IS NOT TO TAKE ANY DIABETES MEDICINE)   Do not wear jewelry, make-up or nail polish.  Do not wear lotions, powders, or perfumes. You may wear deodorant.  Do not shave 48 hours prior to surgery. Men may shave face and neck.   Do not bring valuables to the hospital.              Contacts, dentures or bridgework  may not be worn into surgery.   Leave suitcase in the car. After surgery it may be brought to your room.  For patients admitted to the hospital, checkout time is 11:00 AM the day of discharge.   Patients discharged the day of surgery will not be allowed to drive home  Name and phone number of your driver:  Steffanie Rainwater -  DAUGHTER   Special Instructions: CHG Shower Use Special Wash: 1/2 bottle night before surgery and 1/2 bottle morning of surgery.   Please read over the following fact sheets that you were given: Pain Booklet, Coughing and Deep Breathing, Blood Transfusion Information, MRSA Information and Surgical Site Infection Prevention

## 2012-06-15 NOTE — Progress Notes (Addendum)
0915....smoked only on weekends  Da DR Donato Schultz  IS CARDIO  --- LAST SAW HIM "ABOUT A MONTH AGO".....WILL CALL AND GET OV & ANY AND ALL HEART TESTS....Marland KitchenMarland KitchenDA  CXR RESULTS SHOWED AN ABNORMALITY.  I  CALLED  DR. DALLDORF TO LET HIM KNOW .Marland KitchenMarland KitchenHE WILL ORDER A CT....DA

## 2012-06-16 ENCOUNTER — Encounter (HOSPITAL_COMMUNITY): Payer: Self-pay | Admitting: Vascular Surgery

## 2012-06-16 NOTE — Consult Note (Signed)
Anesthesia Chart Review:  Patient is a 78 year old female scheduled for left TKA on 06/23/12 by Dr. Jerl Santos.  History includes former smoker, dilated Cardiomyopathy '09 (now resolved), CHF, moderate 2V CAD (60% LAD, 70-80% mid CX 01/25/08), HTN, HLD, DM2, OA.  PCP is Dr. Nehemiah Settle.  Cardiologist is Dr. Anne Fu.  She was last seen by [facility] NP Cristopher Peru on 05/21/12.  Her notes indicate that Dr. Anne Fu felt that based on her last stress and echo she was "OK" to see Dr. Jerl Santos for knee surgery consideration.    Echo on 10/11/11 [facility]) showed normal LV size and function, no regional wall motion abnormalities, LVEF 60-65%, mild MR, trivial TR.    Nuclear stress test (03/08/10) showed no evidence of ischemia, EF 75%.  Cardiac cath on 01/25/08 showed: 1. Moderate coronary artery disease with most notable two-vessel disease - ostial left anterior descending 60% and mid circumflex 70% to 80% stenosis.  2. Severe left ventricular systolic dysfunction with ejection fraction of 15% to 20% global. No evidence of thrombus in the apex. Pulsus alternans present.  3. No significant renal artery stenosis.  EKG on 06/15/12 showed NSR, LAD, LVH.  CXR on 06/15/12 showed abnormal density in the right upper lobe.  Dr. Jerl Santos has ordered a CT of the chest for 06/17/12 to further evaluate.  Labs noted.  Cr 0.80, glucose 146. CBC and coags WNL.  UA + small leukocytes, nitrites, and many bacteria.  I called results to Agustin Cree at Dr. Nolon Nations office.  Will follow-up on CT results when available.  Shonna Chock, PA-C

## 2012-06-17 ENCOUNTER — Ambulatory Visit (HOSPITAL_COMMUNITY)
Admission: RE | Admit: 2012-06-17 | Discharge: 2012-06-17 | Disposition: A | Discharge status: Home / Self Care | Payer: Medicare Other | Source: Ambulatory Visit | Attending: Orthopaedic Surgery | Admitting: Orthopaedic Surgery

## 2012-06-17 DIAGNOSIS — R9389 Abnormal findings on diagnostic imaging of other specified body structures: Secondary | ICD-10-CM

## 2012-06-17 DIAGNOSIS — I319 Disease of pericardium, unspecified: Secondary | ICD-10-CM | POA: Insufficient documentation

## 2012-06-17 DIAGNOSIS — R918 Other nonspecific abnormal finding of lung field: Secondary | ICD-10-CM | POA: Insufficient documentation

## 2012-06-17 MED ORDER — IOHEXOL 300 MG/ML  SOLN
80.0000 mL | Freq: Once | INTRAMUSCULAR | Status: AC | PRN
  Administered 2012-06-17: 80 mL via INTRAVENOUS

## 2012-06-23 ENCOUNTER — Encounter (HOSPITAL_COMMUNITY): Admission: RE | Payer: Self-pay | Source: Ambulatory Visit

## 2012-06-23 ENCOUNTER — Ambulatory Visit (HOSPITAL_COMMUNITY): Admission: RE | Admit: 2012-06-23 | Payer: Medicare Other | Source: Ambulatory Visit | Admitting: Orthopaedic Surgery

## 2012-06-23 SURGERY — ARTHROPLASTY, KNEE, TOTAL
Anesthesia: Regional | Laterality: Left

## 2012-07-01 ENCOUNTER — Other Ambulatory Visit (HOSPITAL_COMMUNITY): Payer: Self-pay | Admitting: Orthopaedic Surgery

## 2012-07-01 DIAGNOSIS — R918 Other nonspecific abnormal finding of lung field: Secondary | ICD-10-CM

## 2012-07-03 ENCOUNTER — Encounter (HOSPITAL_COMMUNITY)
Admission: RE | Admit: 2012-07-03 | Discharge: 2012-07-03 | Disposition: A | Discharge status: Home / Self Care | Payer: Medicare Other | Source: Ambulatory Visit | Attending: Orthopaedic Surgery | Admitting: Orthopaedic Surgery

## 2012-07-03 ENCOUNTER — Encounter (HOSPITAL_COMMUNITY): Payer: Self-pay

## 2012-07-03 DIAGNOSIS — R918 Other nonspecific abnormal finding of lung field: Secondary | ICD-10-CM

## 2012-07-03 DIAGNOSIS — R222 Localized swelling, mass and lump, trunk: Secondary | ICD-10-CM | POA: Insufficient documentation

## 2012-07-03 DIAGNOSIS — K119 Disease of salivary gland, unspecified: Secondary | ICD-10-CM | POA: Insufficient documentation

## 2012-07-03 DIAGNOSIS — J984 Other disorders of lung: Secondary | ICD-10-CM | POA: Insufficient documentation

## 2012-07-03 HISTORY — DX: Other nonspecific abnormal finding of lung field: R91.8

## 2012-07-03 MED ORDER — FLUDEOXYGLUCOSE F - 18 (FDG) INJECTION
18.8000 | Freq: Once | INTRAVENOUS | Status: AC | PRN
  Administered 2012-07-03: 18.8 via INTRAVENOUS

## 2012-07-06 LAB — GLUCOSE, CAPILLARY: Glucose-Capillary: 135 mg/dL — ABNORMAL HIGH (ref 70–99)

## 2012-07-09 ENCOUNTER — Institutional Professional Consult (permissible substitution) (INDEPENDENT_AMBULATORY_CARE_PROVIDER_SITE_OTHER): Payer: Medicare Other | Admitting: Cardiothoracic Surgery

## 2012-07-09 ENCOUNTER — Other Ambulatory Visit: Payer: Self-pay | Admitting: Cardiothoracic Surgery

## 2012-07-09 VITALS — BP 140/75 | HR 75 | Temp 98.5°F | Resp 18 | Ht 68.0 in | Wt 146.0 lb

## 2012-07-09 DIAGNOSIS — R918 Other nonspecific abnormal finding of lung field: Secondary | ICD-10-CM

## 2012-07-09 DIAGNOSIS — R222 Localized swelling, mass and lump, trunk: Secondary | ICD-10-CM

## 2012-07-09 DIAGNOSIS — I340 Nonrheumatic mitral (valve) insufficiency: Secondary | ICD-10-CM

## 2012-07-09 DIAGNOSIS — C3491 Malignant neoplasm of unspecified part of right bronchus or lung: Secondary | ICD-10-CM | POA: Insufficient documentation

## 2012-07-09 DIAGNOSIS — D381 Neoplasm of uncertain behavior of trachea, bronchus and lung: Secondary | ICD-10-CM

## 2012-07-09 DIAGNOSIS — I2510 Atherosclerotic heart disease of native coronary artery without angina pectoris: Secondary | ICD-10-CM | POA: Insufficient documentation

## 2012-07-09 DIAGNOSIS — I059 Rheumatic mitral valve disease, unspecified: Secondary | ICD-10-CM

## 2012-07-09 DIAGNOSIS — Z8679 Personal history of other diseases of the circulatory system: Secondary | ICD-10-CM

## 2012-07-10 ENCOUNTER — Encounter (HOSPITAL_COMMUNITY): Payer: Self-pay | Admitting: Pharmacy Technician

## 2012-07-10 ENCOUNTER — Encounter: Payer: Self-pay | Admitting: *Deleted

## 2012-07-10 NOTE — Progress Notes (Signed)
PCP is Katy Apo, MD Referring Provider is Dalldorf, Lubertha Basque, MD  Chief Complaint  Patient presents with  . Lung Mass    MTOC/ referral from Dr Jerl Santos for eval on lung mass, Chest CT 06/17/12, PET Scan 07/03/12                      [address]            [place] [address]          [phone]      HPI: 78 year old female recently diagnosed with a 2.3 cm density in the superior segment of the right lower lobe when she had a chest x-ray before ending the surgery. A followup PET scan showed this to have no significant hypermetabolic activity. There were no other areas of concern on the PET scan or evidence of mediastinal adenopathy. Patient has a remote smoking history but quit 12 years ago. No family history of lung cancer. The patient has been recently treated for idiopathic cardiomyopathy with EF of 15% which 4 Shiley return back close to normal with observation and medical therapy. Her cardiologist is Dr. Anne Fu. The patient did have bronchoscopy by Dr. Delford Field in 2009 for unknown reasons but there is no malignancy noted. Patient presents today for further evaluation and treatment of the newly diagnosed mass in the spear segment right lower lobe which is suspicious for adenocarcinoma   Past Medical History  Diagnosis Date  . CHF (congestive heart failure)   . Hyperlipidemia   . Hypertension   . Diabetes mellitus     dx 10 plus yrs ago  . Arthritis   . Dilated cardiomyopathy     now resolved, EF 60-65% echo 10/11/11 (Dr. Anne Fu)  . CAD (coronary artery disease)     mod 2V CAD (60% LAD, 70-80% mid CX) by 01/2008 cath  . Lung mass   . Allergic rhinitis   . Diverticulosis   . Osteopenia   . MR (mitral regurgitation)     Past Surgical History  Procedure Date  . Abdominal hysterectomy   . Appendectomy   . Knee arthroscopy     ?  left  knee  . Eye surgery     bil cataracts  . Tonsillectomy     Family History  Problem Relation Age of Onset  . Colon  cancer Mother   . Heart disease Brother     Social History History  Substance Use Topics  . Smoking status: Former Smoker -- 0.5 packs/day    Types: Cigarettes    Quit date: 06/15/1972  . Smokeless tobacco: Not on file  . Alcohol Use: No    Current Outpatient Prescriptions  Medication Sig Dispense Refill  . amLODipine (NORVASC) 10 MG tablet Take 10 mg by mouth daily.      Marland Kitchen aspirin 81 MG tablet Take 81 mg by mouth daily.      Marland Kitchen atorvastatin (LIPITOR) 40 MG tablet Take 40 mg by mouth daily.      . carvedilol (COREG) 25 MG tablet Take 25 mg by mouth 2 (two) times daily with a meal.      . furosemide (LASIX) 40 MG tablet Take 40 mg by mouth daily.      Marland Kitchen gabapentin (NEURONTIN) 100 MG capsule Take 100 mg by mouth 3 (three) times daily.      Marland Kitchen glipiZIDE (GLUCOTROL) 5 MG tablet Take 2.5 mg by mouth daily.      . hydrALAZINE (APRESOLINE) 50  MG tablet Take 50 mg by mouth 3 (three) times daily.      Marland Kitchen lisinopril (PRINIVIL,ZESTRIL) 20 MG tablet Take 20 mg by mouth daily.        Allergies  Allergen Reactions  . Moxifloxacin     REACTION: hives  . Penicillins     REACTION: hives  . Prednisone     REACTION: hives    Review of Systems no weight loss no shortness of breath no productive cough no hemoptysis no fever prior history of hysterectomy breast biopsy and appendectomy positive family history of colon cancer she is allergic to penicillin which causes hives  BP 140/75  Pulse 75  Temp 98.5 F (36.9 C) (Oral)  Resp 18  Ht 5\' 8"  (1.727 m)  Wt 146 lb (66.225 kg)  BMI 22.20 kg/m2  SpO2 94% Physical Exam General alert and pleasant comfortable HEENT normocephalic Neck without adenopathy or JVD Thorax without deformity or tenderness breath sounds equal Cardiac rhythm regular I her no gallop or murmur Abdomen soft without mass Extremities no clubbing edema or tenderness Neurologic intact  Diagnostic Tests:  PET scan and CT scan of chest reviewed. 2.3 cm density spear segment  right lower lobe suspicious for adenocarcinoma Impression: We'll proceed with transthoracic CT directed needle biopsy this area. At age 78 with history of cardiomyopathy and COPD would not recommend surgical resection but will wait for pathology to discuss further therapeutic options with patient.  Plan: Return after biopsy

## 2012-07-10 NOTE — Progress Notes (Signed)
Spoke with pt and daughter at [facility] 07/09/12.  No questions or concerns for me at this time.

## 2012-07-14 ENCOUNTER — Other Ambulatory Visit: Payer: Self-pay | Admitting: Radiology

## 2012-07-16 ENCOUNTER — Ambulatory Visit (HOSPITAL_COMMUNITY)
Admission: RE | Admit: 2012-07-16 | Discharge: 2012-07-16 | Disposition: A | Discharge status: Home / Self Care | Payer: Medicare Other | Source: Ambulatory Visit | Attending: Cardiothoracic Surgery | Admitting: Cardiothoracic Surgery

## 2012-07-16 ENCOUNTER — Ambulatory Visit (HOSPITAL_COMMUNITY)
Admission: RE | Admit: 2012-07-16 | Discharge: 2012-07-16 | Disposition: A | Discharge status: Home / Self Care | Payer: Medicare Other | Source: Ambulatory Visit | Attending: Diagnostic Radiology | Admitting: Diagnostic Radiology

## 2012-07-16 DIAGNOSIS — E119 Type 2 diabetes mellitus without complications: Secondary | ICD-10-CM | POA: Insufficient documentation

## 2012-07-16 DIAGNOSIS — D381 Neoplasm of uncertain behavior of trachea, bronchus and lung: Secondary | ICD-10-CM

## 2012-07-16 DIAGNOSIS — R222 Localized swelling, mass and lump, trunk: Secondary | ICD-10-CM | POA: Insufficient documentation

## 2012-07-16 DIAGNOSIS — M129 Arthropathy, unspecified: Secondary | ICD-10-CM | POA: Insufficient documentation

## 2012-07-16 DIAGNOSIS — I10 Essential (primary) hypertension: Secondary | ICD-10-CM | POA: Insufficient documentation

## 2012-07-16 DIAGNOSIS — I509 Heart failure, unspecified: Secondary | ICD-10-CM | POA: Insufficient documentation

## 2012-07-16 DIAGNOSIS — E785 Hyperlipidemia, unspecified: Secondary | ICD-10-CM | POA: Insufficient documentation

## 2012-07-16 LAB — CBC
HCT: 37.8 % (ref 36.0–46.0)
Hemoglobin: 12.5 g/dL (ref 12.0–15.0)
MCH: 29.1 pg (ref 26.0–34.0)
MCHC: 33.1 g/dL (ref 30.0–36.0)
MCV: 88.1 fL (ref 78.0–100.0)
Platelets: 200 10*3/uL (ref 150–400)
RBC: 4.29 MIL/uL (ref 3.87–5.11)
RDW: 14.2 % (ref 11.5–15.5)
WBC: 9.8 10*3/uL (ref 4.0–10.5)

## 2012-07-16 LAB — APTT: aPTT: 35 seconds (ref 24–37)

## 2012-07-16 LAB — PROTIME-INR
INR: 1.06 (ref 0.00–1.49)
Prothrombin Time: 14 s (ref 11.6–15.2)

## 2012-07-16 LAB — GLUCOSE, CAPILLARY: Glucose-Capillary: 151 mg/dL — ABNORMAL HIGH (ref 70–99)

## 2012-07-16 MED ORDER — MIDAZOLAM HCL 5 MG/5ML IJ SOLN
INTRAMUSCULAR | Status: AC | PRN
  Administered 2012-07-16: 1 mg via INTRAVENOUS
  Administered 2012-07-16: 0.5 mg via INTRAVENOUS

## 2012-07-16 MED ORDER — FENTANYL CITRATE 0.05 MG/ML IJ SOLN
INTRAMUSCULAR | Status: AC | PRN
  Administered 2012-07-16: 50 ug via INTRAVENOUS

## 2012-07-16 MED ORDER — SODIUM CHLORIDE 0.9 % IV SOLN: Freq: Once | INTRAVENOUS | Status: DC

## 2012-07-16 MED ORDER — FENTANYL CITRATE 0.05 MG/ML IJ SOLN
INTRAMUSCULAR | Status: AC
  Filled 2012-07-16: qty 4

## 2012-07-16 MED ORDER — MIDAZOLAM HCL 2 MG/2ML IJ SOLN
INTRAMUSCULAR | Status: AC
  Filled 2012-07-16: qty 4

## 2012-07-16 NOTE — H&P (Signed)
Joan Hunter is an 78 y.o. female.   Chief Complaint: right lung mass HPI: Patient with remote hx of smoking and recent imaging revealing a 2.3 cm RLL lung mass presents today for CT guided lung mass biopsy.  Past Medical History  Diagnosis Date  . CHF (congestive heart failure)   . Hyperlipidemia   . Hypertension   . Diabetes mellitus     dx 10 plus yrs ago  . Arthritis   . Dilated cardiomyopathy     now resolved, EF 60-65% echo 10/11/11 (Dr. Anne Fu)  . CAD (coronary artery disease)     mod 2V CAD (60% LAD, 70-80% mid CX) by 01/2008 cath  . Lung mass   . Allergic rhinitis   . Diverticulosis   . Osteopenia   . MR (mitral regurgitation)     Past Surgical History  Procedure Date  . Abdominal hysterectomy   . Appendectomy   . Knee arthroscopy     ?  left  knee  . Eye surgery     bil cataracts  . Tonsillectomy     Family History  Problem Relation Age of Onset  . Colon cancer Mother   . Heart disease Brother    Social History:  reports that she quit smoking about 40 years ago. Her smoking use included Cigarettes. She smoked .5 packs per day. She does not have any smokeless tobacco history on file. She reports that she does not drink alcohol or use illicit drugs.  Allergies:  Allergies  Allergen Reactions  . Moxifloxacin     REACTION: hives  . Penicillins     REACTION: hives  . Prednisone     REACTION: hives    Current outpatient prescriptions:amLODipine (NORVASC) 10 MG tablet, Take 10 mg by mouth daily., Disp: , Rfl: ;  aspirin 81 MG tablet, Take 81 mg by mouth daily., Disp: , Rfl: ;  atorvastatin (LIPITOR) 40 MG tablet, Take 40 mg by mouth daily., Disp: , Rfl: ;  carvedilol (COREG) 25 MG tablet, Take 25 mg by mouth 2 (two) times daily with a meal., Disp: , Rfl: ;  furosemide (LASIX) 40 MG tablet, Take 40 mg by mouth daily., Disp: , Rfl:  gabapentin (NEURONTIN) 100 MG capsule, Take 100 mg by mouth 3 (three) times daily., Disp: , Rfl: ;  glipiZIDE (GLUCOTROL) 5 MG  tablet, Take 2.5 mg by mouth daily., Disp: , Rfl: ;  hydrALAZINE (APRESOLINE) 50 MG tablet, Take 50 mg by mouth 3 (three) times daily., Disp: , Rfl: ;  lisinopril (PRINIVIL,ZESTRIL) 20 MG tablet, Take 20 mg by mouth daily., Disp: , Rfl:  Current facility-administered medications:0.9 %  sodium chloride infusion, , Intravenous, Once, Brayton El, PA   Results for orders placed during the hospital encounter of 07/16/12 (from the past 48 hour(s))  APTT     Status: Normal   Collection Time   07/16/12  9:31 AM      Component Value Range Comment   aPTT 35  24 - 37 seconds   CBC     Status: Normal   Collection Time   07/16/12  9:31 AM      Component Value Range Comment   WBC 9.8  4.0 - 10.5 K/uL    RBC 4.29  3.87 - 5.11 MIL/uL    Hemoglobin 12.5  12.0 - 15.0 g/dL    HCT 16.1  09.6 - 04.5 %    MCV 88.1  78.0 - 100.0 fL    MCH 29.1  26.0 -  34.0 pg    MCHC 33.1  30.0 - 36.0 g/dL    RDW 40.9  81.1 - 91.4 %    Platelets 200  150 - 400 K/uL   PROTIME-INR     Status: Normal   Collection Time   07/16/12  9:31 AM      Component Value Range Comment   Prothrombin Time 14.0  11.6 - 15.2 seconds    INR 1.06  0.00 - 1.49   GLUCOSE, CAPILLARY     Status: Abnormal   Collection Time   07/16/12  9:42 AM      Component Value Range Comment   Glucose-Capillary 151 (*) 70 - 99 mg/dL    No results found.  Review of Systems  Constitutional: Negative for fever and chills.  Respiratory: Positive for cough and shortness of breath. Negative for hemoptysis.   Cardiovascular: Negative for chest pain.  Gastrointestinal: Negative for nausea, vomiting and abdominal pain.  Musculoskeletal: Positive for joint pain. Negative for back pain.  Neurological: Negative for headaches.  Endo/Heme/Allergies: Does not bruise/bleed easily.    Blood pressure 159/79, pulse 86, temperature 98.7 F (37.1 C), temperature source Oral, resp. rate 18, height 5\' 8"  (1.727 m), weight 146 lb (66.225 kg), SpO2 97.00%. Physical Exam    Constitutional: She is oriented to person, place, and time. She appears well-developed and well-nourished.  Cardiovascular: Normal rate and regular rhythm.   Respiratory: Effort normal and breath sounds normal.  GI: Soft. Bowel sounds are normal.  Musculoskeletal: She exhibits no edema.       Rt forearm with cast secondary to wrist fx  Neurological: She is alert and oriented to person, place, and time.     Assessment/Plan Patient with 2.3 cm RLL lung mass; plan is for CT guided biopsy. Details/risk of procedure d/w pt/daughter with their understanding and consent.  Ishani Goldwasser,D KEVIN 07/16/2012, 10:27 AM

## 2012-07-16 NOTE — ED Notes (Signed)
Patient denies pain and is resting comfortably.  

## 2012-07-16 NOTE — Procedures (Signed)
CT guided biopsy of right lung lesion.  2 cores samples obtained.

## 2012-07-22 ENCOUNTER — Ambulatory Visit (INDEPENDENT_AMBULATORY_CARE_PROVIDER_SITE_OTHER): Payer: Medicare Other | Admitting: Cardiothoracic Surgery

## 2012-07-22 ENCOUNTER — Encounter: Payer: Self-pay | Admitting: Cardiothoracic Surgery

## 2012-07-22 VITALS — BP 125/74 | HR 71 | Resp 16 | Ht 68.0 in | Wt 146.0 lb

## 2012-07-22 DIAGNOSIS — R911 Solitary pulmonary nodule: Secondary | ICD-10-CM

## 2012-07-22 DIAGNOSIS — J984 Other disorders of lung: Secondary | ICD-10-CM

## 2012-07-22 NOTE — Progress Notes (Signed)
PCP is Katy Apo, MD Referring Provider is Dalldorf, Lubertha Basque, MD  Chief Complaint  Patient presents with  . Follow-up    discuss needle biopsy    HPI: The patient returns for followup after transthoracic needle biopsy of a right lower lobe superior segmental density which has been faintly present on CT scan since 2009 and now slightly more prominent measuring approximately 2 cm. She also has other scattered areas of reactive scarring. The biopsy was negative with reactive inflammatory changes. There no complications from the biopsy. The patient has a remote history of smoking.  Past Medical History  Diagnosis Date  . CHF (congestive heart failure)   . Hyperlipidemia   . Hypertension   . Diabetes mellitus     dx 10 plus yrs ago  . Arthritis   . Dilated cardiomyopathy     now resolved, EF 60-65% echo 10/11/11 (Dr. Anne Fu)  . CAD (coronary artery disease)     mod 2V CAD (60% LAD, 70-80% mid CX) by 01/2008 cath  . Lung mass   . Allergic rhinitis   . Diverticulosis   . Osteopenia   . MR (mitral regurgitation)     Past Surgical History  Procedure Date  . Abdominal hysterectomy   . Appendectomy   . Knee arthroscopy     ?  left  knee  . Eye surgery     bil cataracts  . Tonsillectomy     Family History  Problem Relation Age of Onset  . Colon cancer Mother   . Heart disease Brother     Social History History  Substance Use Topics  . Smoking status: Former Smoker -- 0.5 packs/day    Types: Cigarettes    Quit date: 06/15/1972  . Smokeless tobacco: Not on file  . Alcohol Use: No    Current Outpatient Prescriptions  Medication Sig Dispense Refill  . amLODipine (NORVASC) 10 MG tablet Take 10 mg by mouth daily.      Marland Kitchen aspirin 81 MG tablet Take 81 mg by mouth daily.      Marland Kitchen atorvastatin (LIPITOR) 40 MG tablet Take 40 mg by mouth daily.      . carvedilol (COREG) 25 MG tablet Take 25 mg by mouth 2 (two) times daily with a meal.      . furosemide (LASIX) 40 MG tablet  Take 40 mg by mouth daily.      Marland Kitchen gabapentin (NEURONTIN) 100 MG capsule Take 100 mg by mouth 3 (three) times daily.      Marland Kitchen glipiZIDE (GLUCOTROL) 5 MG tablet Take 2.5 mg by mouth daily.      . hydrALAZINE (APRESOLINE) 50 MG tablet Take 50 mg by mouth 3 (three) times daily.      Marland Kitchen lisinopril (PRINIVIL,ZESTRIL) 20 MG tablet Take 20 mg by mouth daily.        Allergies  Allergen Reactions  . Moxifloxacin     REACTION: hives  . Penicillins     REACTION: hives  . Prednisone     REACTION: hives    Review of Systems no fever no hemoptysis following biopsy no chest pain following biopsy  BP 125/74  Pulse 71  Resp 16  Ht 5\' 8"  (1.727 m)  Wt 146 lb (66.225 kg)  BMI 22.20 kg/m2  SpO2 94% Physical Exam Alert and pleasant Breath sounds clear Right arm in sling the 2 recent orthopedic surgery of the wrist   Diagnostic Tests: Biopsy report reviewed with patient and daughter. The lesion has reactive inflammatory  changes no evidence of malignancy  Impression: Return for a CT of the chest in 6 months for surveillance of this density.    Plan:

## 2012-12-21 ENCOUNTER — Other Ambulatory Visit: Payer: Self-pay | Admitting: Cardiothoracic Surgery

## 2012-12-21 DIAGNOSIS — D381 Neoplasm of uncertain behavior of trachea, bronchus and lung: Secondary | ICD-10-CM

## 2013-01-18 ENCOUNTER — Other Ambulatory Visit: Payer: Self-pay | Admitting: Cardiothoracic Surgery

## 2013-01-18 LAB — BUN: BUN: 19 mg/dL (ref 6–23)

## 2013-01-18 LAB — CREATININE, SERUM: Creat: 0.92 mg/dL (ref 0.50–1.10)

## 2013-01-20 ENCOUNTER — Ambulatory Visit
Admission: RE | Admit: 2013-01-20 | Discharge: 2013-01-20 | Disposition: A | Discharge status: Home / Self Care | Payer: Medicare Other | Source: Ambulatory Visit | Attending: Cardiothoracic Surgery | Admitting: Cardiothoracic Surgery

## 2013-01-20 ENCOUNTER — Ambulatory Visit (INDEPENDENT_AMBULATORY_CARE_PROVIDER_SITE_OTHER): Payer: Medicare Other | Admitting: Cardiothoracic Surgery

## 2013-01-20 VITALS — BP 155/89 | HR 89 | Resp 16 | Ht 68.0 in | Wt 145.0 lb

## 2013-01-20 DIAGNOSIS — R911 Solitary pulmonary nodule: Secondary | ICD-10-CM

## 2013-01-20 DIAGNOSIS — D381 Neoplasm of uncertain behavior of trachea, bronchus and lung: Secondary | ICD-10-CM

## 2013-01-20 MED ORDER — IOHEXOL 300 MG/ML  SOLN
75.0000 mL | Freq: Once | INTRAMUSCULAR | Status: AC | PRN
  Administered 2013-01-20: 75 mL via INTRAVENOUS

## 2013-01-20 NOTE — Progress Notes (Signed)
PCP is Katy Apo, MD Referring Provider is Dalldorf, Lubertha Basque, MD  Chief Complaint  Patient presents with  . Follow-up    6 month f/u lung nodule with CT CHEST    HPI: Direct Legaspi is a frail 79 year old ex-smoker with a history of heart disease and inflammatory lung disease. She is had a infiltrative density in the superior segment right lower lobe since 2009 which is shown slight increase in size. A transthoracic needle biopsy performed 6 months ago showed no definite malignant cells. She's had a recent upper respiratory infection on a Z-Pak currently. She's had no other significant health changes over the past 6 months, no weight loss bone pain or headache.  CT scan today shows minimal change over 6 months however comparing today's x-ray to the CT scan in 2009 there is been a slight increase in size. Other scattered pulmonary nodules are stable. The patient is a poor operative candidate uses a wheelchair to get around at home. At one point she had a nonischemic cardiomyopathy. We discussed the fact that this is probably a slow-growing lung tumor but without significant risk for metastasis  or significant impact on her health I've recommended that we continue to follow this area. I do not feel that a repeat biopsy would eat productive. She return in 6 months with a followup CT scan. The patient would never be a surgical candidate for resection but significant progression was noted on scan a repeat biopsy and possible stereotactic radiation therapy would be indicated Past Medical History  Diagnosis Date  . CHF (congestive heart failure)   . Hyperlipidemia   . Hypertension   . Diabetes mellitus     dx 10 plus yrs ago  . Arthritis   . Dilated cardiomyopathy     now resolved, EF 60-65% echo 10/11/11 (Dr. Anne Fu)  . CAD (coronary artery disease)     mod 2V CAD (60% LAD, 70-80% mid CX) by 01/2008 cath  . Lung mass   . Allergic rhinitis   . Diverticulosis   . Osteopenia   . MR  (mitral regurgitation)     Past Surgical History  Procedure Date  . Abdominal hysterectomy   . Appendectomy   . Knee arthroscopy     ?  left  knee  . Eye surgery     bil cataracts  . Tonsillectomy     Family History  Problem Relation Age of Onset  . Colon cancer Mother   . Heart disease Brother     Social History History  Substance Use Topics  . Smoking status: Former Smoker -- 0.5 packs/day    Types: Cigarettes    Quit date: 06/15/1972  . Smokeless tobacco: Not on file  . Alcohol Use: No    Current Outpatient Prescriptions  Medication Sig Dispense Refill  . amLODipine (NORVASC) 10 MG tablet Take 10 mg by mouth daily.      Marland Kitchen aspirin 81 MG tablet Take 81 mg by mouth daily.      Marland Kitchen atorvastatin (LIPITOR) 40 MG tablet Take 40 mg by mouth daily.      . carvedilol (COREG) 25 MG tablet Take 25 mg by mouth 2 (two) times daily with a meal.      . furosemide (LASIX) 40 MG tablet Take 40 mg by mouth daily.      Marland Kitchen gabapentin (NEURONTIN) 100 MG capsule Take 100 mg by mouth 3 (three) times daily.      Marland Kitchen glipiZIDE (GLUCOTROL) 5 MG tablet Take 2.5 mg  by mouth daily.      . hydrALAZINE (APRESOLINE) 50 MG tablet Take 50 mg by mouth 3 (three) times daily.      Marland Kitchen lisinopril (PRINIVIL,ZESTRIL) 20 MG tablet Take 20 mg by mouth daily.        Allergies  Allergen Reactions  . Moxifloxacin     REACTION: hives  . Penicillins     REACTION: hives  . Prednisone     REACTION: hives    Review of Systems no weight loss no change in health  BP 155/89  Pulse 89  Resp 16  Ht 5\' 8"  (1.727 m)  Wt 145 lb (65.772 kg)  BMI 22.05 kg/m2  SpO2 98% Physical Exam Her comfortable No adenopathy the neck Breath sounds clear No JVD Cardiac rhythm regular  Diagnostic Tests:  CT scan of chest results discussed with patient and daughter Impression: Slight change in right lung soft density over the past 5 years with negative biopsy 6 months ago  Plan: Continue surveillance, CT scan in 6  months

## 2013-02-08 ENCOUNTER — Emergency Department (HOSPITAL_COMMUNITY)
Admission: EM | Admit: 2013-02-08 | Discharge: 2013-02-08 | Disposition: A | Discharge status: Home / Self Care | Payer: Medicare Other | Attending: Emergency Medicine | Admitting: Emergency Medicine

## 2013-02-08 ENCOUNTER — Encounter (HOSPITAL_COMMUNITY): Payer: Self-pay

## 2013-02-08 ENCOUNTER — Emergency Department (HOSPITAL_COMMUNITY): Payer: Medicare Other

## 2013-02-08 DIAGNOSIS — Z8709 Personal history of other diseases of the respiratory system: Secondary | ICD-10-CM | POA: Insufficient documentation

## 2013-02-08 DIAGNOSIS — S0003XA Contusion of scalp, initial encounter: Secondary | ICD-10-CM | POA: Insufficient documentation

## 2013-02-08 DIAGNOSIS — Y92009 Unspecified place in unspecified non-institutional (private) residence as the place of occurrence of the external cause: Secondary | ICD-10-CM | POA: Insufficient documentation

## 2013-02-08 DIAGNOSIS — Z8739 Personal history of other diseases of the musculoskeletal system and connective tissue: Secondary | ICD-10-CM | POA: Insufficient documentation

## 2013-02-08 DIAGNOSIS — Z7982 Long term (current) use of aspirin: Secondary | ICD-10-CM | POA: Insufficient documentation

## 2013-02-08 DIAGNOSIS — Y9301 Activity, walking, marching and hiking: Secondary | ICD-10-CM | POA: Insufficient documentation

## 2013-02-08 DIAGNOSIS — Z79899 Other long term (current) drug therapy: Secondary | ICD-10-CM | POA: Insufficient documentation

## 2013-02-08 DIAGNOSIS — I509 Heart failure, unspecified: Secondary | ICD-10-CM | POA: Insufficient documentation

## 2013-02-08 DIAGNOSIS — Z8719 Personal history of other diseases of the digestive system: Secondary | ICD-10-CM | POA: Insufficient documentation

## 2013-02-08 DIAGNOSIS — Z8679 Personal history of other diseases of the circulatory system: Secondary | ICD-10-CM | POA: Insufficient documentation

## 2013-02-08 DIAGNOSIS — E119 Type 2 diabetes mellitus without complications: Secondary | ICD-10-CM | POA: Insufficient documentation

## 2013-02-08 DIAGNOSIS — I2510 Atherosclerotic heart disease of native coronary artery without angina pectoris: Secondary | ICD-10-CM | POA: Insufficient documentation

## 2013-02-08 DIAGNOSIS — Z87891 Personal history of nicotine dependence: Secondary | ICD-10-CM | POA: Insufficient documentation

## 2013-02-08 DIAGNOSIS — E785 Hyperlipidemia, unspecified: Secondary | ICD-10-CM | POA: Insufficient documentation

## 2013-02-08 DIAGNOSIS — W1809XA Striking against other object with subsequent fall, initial encounter: Secondary | ICD-10-CM | POA: Insufficient documentation

## 2013-02-08 DIAGNOSIS — I10 Essential (primary) hypertension: Secondary | ICD-10-CM | POA: Insufficient documentation

## 2013-02-08 NOTE — ED Provider Notes (Signed)
History     CSN: [id number]  Arrival date & time 02/08/13  1546   First MD Initiated Contact with Patient 02/08/13 1629      Chief Complaint  Patient presents with  . Fall  . Head Injury    (Consider location/radiation/quality/duration/timing/severity/associated sxs/prior treatment) HPI Pt presents after fall from standing.  She states she was walking in her house and felt that one knee gave out on her causing her to fall forward hitting her head.  No LOC.  No amnesia for event.  Denies neck or back pain.  No pain in extremities, has been able to walk and bear weight without difficulty.  Does have bruise on her forehead.  Does not take any blood thinners except baby aspirin daily.  There are no other associated systemic symptoms, there are no other alleviating or modifying factors.   Past Medical History  Diagnosis Date  . CHF (congestive heart failure)   . Hyperlipidemia   . Hypertension   . Diabetes mellitus     dx 10 plus yrs ago  . Arthritis   . Dilated cardiomyopathy     now resolved, EF 60-65% echo 10/11/11 (Dr. Anne Fu)  . CAD (coronary artery disease)     mod 2V CAD (60% LAD, 70-80% mid CX) by 01/2008 cath  . Lung mass   . Allergic rhinitis   . Diverticulosis   . Osteopenia   . MR (mitral regurgitation)     Past Surgical History  Procedure Laterality Date  . Abdominal hysterectomy    . Appendectomy    . Knee arthroscopy      ?  left  knee  . Eye surgery      bil cataracts  . Tonsillectomy      Family History  Problem Relation Age of Onset  . Colon cancer Mother   . Heart disease Brother     History  Substance Use Topics  . Smoking status: Former Smoker -- 0.50 packs/day    Types: Cigarettes    Quit date: 06/15/1972  . Smokeless tobacco: Not on file  . Alcohol Use: No    OB History   Grav Para Term Preterm Abortions TAB SAB Ect Mult Living                  Review of Systems ROS reviewed and all otherwise negative except for mentioned in  HPI  Allergies  Moxifloxacin; Penicillins; and Prednisone  Home Medications   Current Outpatient Rx  Name  Route  Sig  Dispense  Refill  . amLODipine (NORVASC) 10 MG tablet   Oral   Take 10 mg by mouth daily.         Marland Kitchen aspirin 81 MG tablet   Oral   Take 81 mg by mouth daily.         Marland Kitchen atorvastatin (LIPITOR) 40 MG tablet   Oral   Take 40 mg by mouth daily.         . carvedilol (COREG) 25 MG tablet   Oral   Take 25 mg by mouth 2 (two) times daily with a meal.         . furosemide (LASIX) 40 MG tablet   Oral   Take 40 mg by mouth daily.         Marland Kitchen gabapentin (NEURONTIN) 100 MG capsule   Oral   Take 100 mg by mouth 3 (three) times daily. Pain         . glipiZIDE (GLUCOTROL) 5 MG  tablet   Oral   Take 2.5 mg by mouth daily.         . hydrALAZINE (APRESOLINE) 50 MG tablet   Oral   Take 50 mg by mouth 3 (three) times daily.         Marland Kitchen lisinopril (PRINIVIL,ZESTRIL) 20 MG tablet   Oral   Take 20 mg by mouth daily.           BP 155/67  Pulse 79  Temp(Src) 98.1 F (36.7 C) (Oral)  Resp 16  SpO2 97% Vitals reviewed Physical Exam  Physical Examination: General appearance - alert, well appearing, and in no distress Mental status - alert, oriented to person, place, and time Eyes - pupils equal and reactive, extraocular eye movements intact Mouth - mucous membranes moist, pharynx normal without lesions Neck - no midline tenderness, FROM without pain Chest - clear to auscultation, no wheezes, rales or rhonchi, symmetric air entry Heart - normal rate, regular rhythm, normal S1, S2, no murmurs, rubs, clicks or gallops Abdomen - soft, nontender, nondistended, no masses or organomegaly Back exam - no midline tenderness to palpation, no CVA tenderness Neurological - alert, oriented, normal speech, strength 5/5 in extremities x 4, sensation intact Musculoskeletal - no joint tenderness, deformity or swelling Extremities - peripheral pulses normal, no pedal  edema, no clubbing or cyanosis Skin - normal coloration and turgor, no rashes  ED Course  Procedures (including critical care time)  Labs Reviewed - No data to display Ct Head Wo Contrast  02/08/2013  *RADIOLOGY REPORT*  Clinical Data: Fall.  Forehead struck the floor.  Hematoma.  CT HEAD WITHOUT CONTRAST  Technique:  Contiguous axial images were obtained from the base of the skull through the vertex without contrast.  Comparison: None.  Findings: Symmetric globus pallidus nuclei are calcifications probably physiologic.  Otherwise, the brain stem, cerebellum, cerebral peduncles, thalami, basal ganglia, basilar cisterns, and ventricular system appear unremarkable.  Periventricular and corona radiata white matter hypodensities are most compatible with chronic ischemic microvascular white matter disease.  No intracranial hemorrhage, mass lesion, or acute infarction is identified.  Atherosclerotic calcification of the carotid siphons noted. Forehead scalp hematoma observed.  No calvarial fracture underlying the scalp hematoma noted.  IMPRESSION:  1.  Forehead scalp hematoma, without acute intracranial findings. 2. Periventricular and corona radiata white matter hypodensities are most compatible with chronic ischemic microvascular white matter disease. 3.  Atherosclerotic calcification of the carotid siphons.   Original Report Authenticated By: Gaylyn Rong, M.D.      1. Fall, initial encounter   2. Hematoma of frontal scalp, initial encounter       MDM  Pt presenting with frontal hematoma after fall from standing today.  Fall was mechanical in nature.  No syncope.  Head CT shows no acute findings besides the hematoma.  Ice pack applied in ED.  Discharged with strict return precautions.  Pt agreeable with plan.        Ethelda Chick, MD 02/08/13 [phone]

## 2013-02-08 NOTE — ED Notes (Signed)
Patient transported to CT 

## 2013-02-08 NOTE — ED Notes (Signed)
Patient reports that she fell today after her knees gave out on her. Patient states her forehead hit the floor. Patient has a hematoma on forehead. Patient denies any blurred vision. Patient denies taking any blood thinners.

## 2013-07-19 ENCOUNTER — Other Ambulatory Visit: Payer: Self-pay | Admitting: *Deleted

## 2013-07-19 DIAGNOSIS — R918 Other nonspecific abnormal finding of lung field: Secondary | ICD-10-CM

## 2013-08-06 LAB — BUN: BUN: 17 mg/dL (ref 6–23)

## 2013-08-06 LAB — CREATININE, SERUM: Creat: 0.91 mg/dL (ref 0.50–1.10)

## 2013-08-10 ENCOUNTER — Ambulatory Visit: Payer: Medicare Other | Admitting: Thoracic Surgery (Cardiothoracic Vascular Surgery)

## 2013-08-11 ENCOUNTER — Encounter: Payer: Medicare Other | Admitting: Cardiothoracic Surgery

## 2013-08-11 ENCOUNTER — Other Ambulatory Visit: Payer: Medicare Other

## 2013-08-25 ENCOUNTER — Ambulatory Visit
Admission: RE | Admit: 2013-08-25 | Discharge: 2013-08-25 | Disposition: A | Discharge status: Home / Self Care | Payer: Medicare Other | Source: Ambulatory Visit | Attending: Cardiothoracic Surgery | Admitting: Cardiothoracic Surgery

## 2013-08-25 ENCOUNTER — Ambulatory Visit: Payer: Medicare Other | Admitting: Cardiothoracic Surgery

## 2013-08-25 DIAGNOSIS — R918 Other nonspecific abnormal finding of lung field: Secondary | ICD-10-CM

## 2013-08-25 MED ORDER — IOHEXOL 300 MG/ML  SOLN
75.0000 mL | Freq: Once | INTRAMUSCULAR | Status: AC | PRN
  Administered 2013-08-25: 75 mL via INTRAVENOUS

## 2013-08-27 ENCOUNTER — Encounter: Payer: Self-pay | Admitting: Cardiothoracic Surgery

## 2013-08-27 ENCOUNTER — Other Ambulatory Visit: Payer: Self-pay

## 2013-08-27 ENCOUNTER — Ambulatory Visit (INDEPENDENT_AMBULATORY_CARE_PROVIDER_SITE_OTHER): Payer: Medicare Other | Admitting: Cardiothoracic Surgery

## 2013-08-27 VITALS — BP 136/77 | HR 86 | Resp 18 | Ht 68.0 in | Wt 149.0 lb

## 2013-08-27 DIAGNOSIS — D381 Neoplasm of uncertain behavior of trachea, bronchus and lung: Secondary | ICD-10-CM

## 2013-08-27 DIAGNOSIS — R911 Solitary pulmonary nodule: Secondary | ICD-10-CM

## 2013-08-27 NOTE — Progress Notes (Signed)
PCP is Katy Apo, MD Referring Provider is Dalldorf, Lubertha Basque, MD  Chief Complaint  Patient presents with  . Follow-up    6 month f/u with Chest CT, surveillance of lung nodule    HPI: 79 year old female returns for further discussion of groundglass density in the superior segment right lower lobe. Remote history of smoking. Patient had this area needle biopsied approximate year ago which showed inflammatory changes. However on serial CT scans he continues to grow and is now 2.6 cm in diameter. The patient complains of a dry nonproductive cough. She has no chest pain hemoptysis neurologic change or weight loss   Past Medical History  Diagnosis Date  . CHF (congestive heart failure)   . Hyperlipidemia   . Hypertension   . Diabetes mellitus     dx 10 plus yrs ago  . Arthritis   . Dilated cardiomyopathy     now resolved, EF 60-65% echo 10/11/11 (Dr. Anne Fu)  . CAD (coronary artery disease)     mod 2V CAD (60% LAD, 70-80% mid CX) by 01/2008 cath  . Lung mass   . Allergic rhinitis   . Diverticulosis   . Osteopenia   . MR (mitral regurgitation)     Past Surgical History  Procedure Laterality Date  . Abdominal hysterectomy    . Appendectomy    . Knee arthroscopy      ?  left  knee  . Eye surgery      bil cataracts  . Tonsillectomy      Family History  Problem Relation Age of Onset  . Colon cancer Mother   . Heart disease Brother     Social History History  Substance Use Topics  . Smoking status: Former Smoker -- 0.50 packs/day    Types: Cigarettes    Quit date: 06/15/1972  . Smokeless tobacco: Not on file  . Alcohol Use: No    Current Outpatient Prescriptions  Medication Sig Dispense Refill  . amLODipine (NORVASC) 10 MG tablet Take 10 mg by mouth daily.      Marland Kitchen aspirin 81 MG tablet Take 81 mg by mouth daily.      Marland Kitchen atorvastatin (LIPITOR) 40 MG tablet Take 40 mg by mouth daily.      . carvedilol (COREG) 25 MG tablet Take 25 mg by mouth 2 (two) times daily  with a meal.      . furosemide (LASIX) 40 MG tablet Take 40 mg by mouth daily.      Marland Kitchen gabapentin (NEURONTIN) 100 MG capsule Take 100 mg by mouth 3 (three) times daily. Pain      . glipiZIDE (GLUCOTROL) 5 MG tablet Take 2.5 mg by mouth daily.      . hydrALAZINE (APRESOLINE) 50 MG tablet Take 50 mg by mouth 3 (three) times daily.      Marland Kitchen lisinopril (PRINIVIL,ZESTRIL) 20 MG tablet Take 20 mg by mouth daily.       No current facility-administered medications for this visit.    Allergies  Allergen Reactions  . Moxifloxacin     REACTION: hives  . Penicillins     REACTION: hives  . Prednisone     REACTION: hives    Review of Systems since last office visit the patient fell and had a scalp hematoma. 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 The patient and daughter wish to be aggressive and we'll treat this if malignant so the patient will be scheduled for another needle biopsy.  Plan: Return for review of needle biopsy

## 2013-08-30 ENCOUNTER — Other Ambulatory Visit: Payer: Self-pay | Admitting: Radiology

## 2013-08-30 NOTE — Addendum Note (Signed)
Addended by: Brayton El F on: 08/30/2013 01:23 PM   Modules accepted: Orders

## 2013-08-31 ENCOUNTER — Encounter (HOSPITAL_COMMUNITY): Payer: Self-pay | Admitting: Pharmacy Technician

## 2013-09-01 ENCOUNTER — Ambulatory Visit (HOSPITAL_COMMUNITY)
Admission: RE | Admit: 2013-09-01 | Discharge: 2013-09-01 | Disposition: A | Discharge status: Home / Self Care | Payer: Medicare Other | Source: Ambulatory Visit | Attending: Interventional Radiology | Admitting: Interventional Radiology

## 2013-09-01 ENCOUNTER — Ambulatory Visit (HOSPITAL_COMMUNITY)
Admission: RE | Admit: 2013-09-01 | Discharge: 2013-09-01 | Disposition: A | Discharge status: Home / Self Care | Payer: Medicare Other | Source: Ambulatory Visit | Attending: Cardiothoracic Surgery | Admitting: Cardiothoracic Surgery

## 2013-09-01 ENCOUNTER — Encounter (HOSPITAL_COMMUNITY): Payer: Self-pay

## 2013-09-01 DIAGNOSIS — I059 Rheumatic mitral valve disease, unspecified: Secondary | ICD-10-CM | POA: Insufficient documentation

## 2013-09-01 DIAGNOSIS — I2510 Atherosclerotic heart disease of native coronary artery without angina pectoris: Secondary | ICD-10-CM | POA: Insufficient documentation

## 2013-09-01 DIAGNOSIS — C3430 Malignant neoplasm of lower lobe, unspecified bronchus or lung: Secondary | ICD-10-CM | POA: Insufficient documentation

## 2013-09-01 DIAGNOSIS — I509 Heart failure, unspecified: Secondary | ICD-10-CM | POA: Insufficient documentation

## 2013-09-01 DIAGNOSIS — E785 Hyperlipidemia, unspecified: Secondary | ICD-10-CM | POA: Insufficient documentation

## 2013-09-01 DIAGNOSIS — E119 Type 2 diabetes mellitus without complications: Secondary | ICD-10-CM | POA: Insufficient documentation

## 2013-09-01 DIAGNOSIS — M129 Arthropathy, unspecified: Secondary | ICD-10-CM | POA: Insufficient documentation

## 2013-09-01 DIAGNOSIS — I10 Essential (primary) hypertension: Secondary | ICD-10-CM | POA: Insufficient documentation

## 2013-09-01 DIAGNOSIS — D381 Neoplasm of uncertain behavior of trachea, bronchus and lung: Secondary | ICD-10-CM

## 2013-09-01 DIAGNOSIS — K5730 Diverticulosis of large intestine without perforation or abscess without bleeding: Secondary | ICD-10-CM | POA: Insufficient documentation

## 2013-09-01 DIAGNOSIS — Z01812 Encounter for preprocedural laboratory examination: Secondary | ICD-10-CM | POA: Insufficient documentation

## 2013-09-01 DIAGNOSIS — M899 Disorder of bone, unspecified: Secondary | ICD-10-CM | POA: Insufficient documentation

## 2013-09-01 LAB — CBC
MCH: 28.9 pg (ref 26.0–34.0)
MCHC: 32.6 g/dL (ref 30.0–36.0)
MCV: 88.7 fL (ref 78.0–100.0)
Platelets: 197 10*3/uL (ref 150–400)
RDW: 14.2 % (ref 11.5–15.5)
WBC: 7.8 10*3/uL (ref 4.0–10.5)

## 2013-09-01 LAB — GLUCOSE, CAPILLARY: Glucose-Capillary: 106 mg/dL — ABNORMAL HIGH (ref 70–99)

## 2013-09-01 MED ORDER — MIDAZOLAM HCL 2 MG/2ML IJ SOLN
INTRAMUSCULAR | Status: AC
  Filled 2013-09-01: qty 6

## 2013-09-01 MED ORDER — FENTANYL CITRATE 0.05 MG/ML IJ SOLN
INTRAMUSCULAR | Status: AC | PRN
  Administered 2013-09-01: 50 ug via INTRAVENOUS
  Administered 2013-09-01: 25 ug via INTRAVENOUS
  Administered 2013-09-01: 50 ug via INTRAVENOUS

## 2013-09-01 MED ORDER — MIDAZOLAM HCL 2 MG/2ML IJ SOLN
INTRAMUSCULAR | Status: AC
  Filled 2013-09-01: qty 4

## 2013-09-01 MED ORDER — LIDOCAINE HCL 1 % IJ SOLN
INTRAMUSCULAR | Status: AC
  Filled 2013-09-01: qty 10

## 2013-09-01 MED ORDER — FENTANYL CITRATE 0.05 MG/ML IJ SOLN
INTRAMUSCULAR | Status: AC
  Filled 2013-09-01: qty 4

## 2013-09-01 MED ORDER — MIDAZOLAM HCL 2 MG/2ML IJ SOLN
INTRAMUSCULAR | Status: AC | PRN
  Administered 2013-09-01: 2 mg via INTRAVENOUS
  Administered 2013-09-01: 0.5 mg via INTRAVENOUS
  Administered 2013-09-01: 1 mg via INTRAVENOUS

## 2013-09-01 MED ORDER — SODIUM CHLORIDE 0.9 % IV SOLN
INTRAVENOUS | Status: DC
  Administered 2013-09-01: 09:00:00 via INTRAVENOUS

## 2013-09-01 NOTE — H&P (Signed)
Joan Hunter is an 79 y.o. female.   Chief Complaint: R lung mass Pt has had same mass biopsied 07/16/12: lymphoplasmacytic inflammation Presented recently with dry cough x weeks to Dr Donata Clay CT reveals same RLL nodule- enlarging Scheduled now for biopsy HPI: CHF; HLD; HTN; DM; CAD- cardiomyopathy; mitral regurg  Past Medical History  Diagnosis Date  . CHF (congestive heart failure)   . Hyperlipidemia   . Hypertension   . Diabetes mellitus     dx 10 plus yrs ago  . Arthritis   . Dilated cardiomyopathy     now resolved, EF 60-65% echo 10/11/11 (Dr. Anne Fu)  . CAD (coronary artery disease)     mod 2V CAD (60% LAD, 70-80% mid CX) by 01/2008 cath  . Lung mass   . Allergic rhinitis   . Diverticulosis   . Osteopenia   . MR (mitral regurgitation)     Past Surgical History  Procedure Laterality Date  . Abdominal hysterectomy    . Appendectomy    . Knee arthroscopy      ?  left  knee  . Eye surgery      bil cataracts  . Tonsillectomy      Family History  Problem Relation Age of Onset  . Colon cancer Mother   . Heart disease Brother    Social History:  reports that she quit smoking about 41 years ago. Her smoking use included Cigarettes. She smoked 0.50 packs per day. She does not have any smokeless tobacco history on file. She reports that she does not drink alcohol or use illicit drugs.  Allergies:  Allergies  Allergen Reactions  . Moxifloxacin     REACTION: hives  . Penicillins     REACTION: hives  . Prednisone     REACTION: hives     (Not in a hospital admission)  Results for orders placed during the hospital encounter of 09/01/13 (from the past 48 hour(s))  GLUCOSE, CAPILLARY     Status: Abnormal   Collection Time    09/01/13  9:27 AM      Result Value Range   Glucose-Capillary 106 (*) 70 - 99 mg/dL   No results found.  Review of Systems  Constitutional: Negative for fever and weight loss.  Respiratory: Positive for cough.   Cardiovascular:  Negative for chest pain.  Gastrointestinal: Negative for nausea, vomiting and abdominal pain.  Neurological: Positive for weakness.  Psychiatric/Behavioral: Negative for substance abuse.    Blood pressure 169/67, pulse 67, temperature 98 F (36.7 C), temperature source Oral, resp. rate 18, height 5\' 5"  (1.651 m), weight 149 lb (67.586 kg), SpO2 100.00%. Physical Exam  Constitutional: She is oriented to person, place, and time.  Cardiovascular: Normal rate and regular rhythm.   Murmur heard. Respiratory: Effort normal and breath sounds normal. She has no wheezes.  GI: Soft. Bowel sounds are normal. There is no tenderness.  Musculoskeletal: Normal range of motion. She exhibits no edema.  Uses cane  Neurological: She is alert and oriented to person, place, and time.  Skin: Skin is warm and dry.  Psychiatric: She has a normal mood and affect. Her behavior is normal. Judgment and thought content normal.     Assessment/Plan RLL nodule- enlarging New dry cough x weeks Previous bx of same nodule- 07/16/12: inflammatory changes Scheduled now for re Bx of this enlarging nodule Pt and dtr aware of procedure benefits and risks and agreeable to proceed Consent signed and in chart  Macdonald Rigor A 09/01/2013, 9:50  AM    

## 2013-09-01 NOTE — Progress Notes (Signed)
WHEN CLIENT SAT UP TO GET DRESSED, C/O 7/10 EPIGASTRIC PAIN AND DR HOSS NOTIFIED; CLIENT STATES NOT SHORT OF BREATH. PER DR HOSS

## 2013-09-01 NOTE — ED Notes (Signed)
Patient denies pain and is resting comfortably.  

## 2013-09-01 NOTE — Procedures (Signed)
RLL lung Bx No comp 

## 2013-09-01 NOTE — Progress Notes (Signed)
DR HASSELL CALLED AND PER DR HASSELL OK TO FEED CLIENT AND OK TO D/C AT 1830

## 2013-09-08 ENCOUNTER — Encounter: Payer: Self-pay | Admitting: Cardiothoracic Surgery

## 2013-09-08 ENCOUNTER — Encounter: Payer: Self-pay | Admitting: *Deleted

## 2013-09-08 ENCOUNTER — Ambulatory Visit (INDEPENDENT_AMBULATORY_CARE_PROVIDER_SITE_OTHER): Payer: Medicare Other | Admitting: Cardiothoracic Surgery

## 2013-09-08 VITALS — BP 115/64 | HR 74 | Resp 18 | Ht 65.0 in | Wt 149.0 lb

## 2013-09-08 DIAGNOSIS — Z9889 Other specified postprocedural states: Secondary | ICD-10-CM

## 2013-09-08 DIAGNOSIS — R911 Solitary pulmonary nodule: Secondary | ICD-10-CM

## 2013-09-08 NOTE — Progress Notes (Signed)
PCP is Katy Apo, MD Referring Provider is Polite, Deirdre Peer, MD  Chief Complaint  Patient presents with  . Routine Post Op    F/U after BX    HPI: Patient returns for followup discussion after transthoracic needle biopsy of 2. 5 cm groundglass density superior segment right lower lobe. Pathology adenocarcinoma. The patient has several comorbid problems which would preclude surgical resection including advanced age, history of idiopathic cardiomyopathy-which has improved, history of CAD 2 vessel from cath 2009 with MR, moderate to severe COPD. Resection would require probable lobectomy. Pathology discussed with patient and daughter. Plan on referring to thoracic oncology clinic for consideration of stereotactic radiation with possible chemotherapy. Genetic markers requested for study by pathology of recent biopsy. Patient had a head CT scan earlier this year which was negative. Last PET scan was 2013 (negative) when this density was initially demonstrated.  Past Medical History  Diagnosis Date  . CHF (congestive heart failure)   . Hyperlipidemia   . Hypertension   . Diabetes mellitus     dx 10 plus yrs ago  . Arthritis   . Dilated cardiomyopathy     now resolved, EF 60-65% echo 10/11/11 (Dr. Anne Fu)  . CAD (coronary artery disease)     mod 2V CAD (60% LAD, 70-80% mid CX) by 01/2008 cath  . Lung mass   . Allergic rhinitis   . Diverticulosis   . Osteopenia   . MR (mitral regurgitation)     Past Surgical History  Procedure Laterality Date  . Abdominal hysterectomy    . Appendectomy    . Knee arthroscopy      ?  left  knee  . Eye surgery      bil cataracts  . Tonsillectomy      Family History  Problem Relation Age of Onset  . Colon cancer Mother   . Heart disease Brother     Social History History  Substance Use Topics  . Smoking status: Former Smoker -- 0.50 packs/day    Types: Cigarettes    Quit date: 06/15/1972  . Smokeless tobacco: Not on file  . Alcohol  Use: No    Current Outpatient Prescriptions  Medication Sig Dispense Refill  . amLODipine (NORVASC) 10 MG tablet Take 10 mg by mouth daily.      Marland Kitchen aspirin 81 MG tablet Take 81 mg by mouth daily.      Marland Kitchen atorvastatin (LIPITOR) 40 MG tablet Take 40 mg by mouth daily.      . carvedilol (COREG) 25 MG tablet Take 25 mg by mouth 2 (two) times daily with a meal.      . furosemide (LASIX) 40 MG tablet Take 40 mg by mouth daily.      Marland Kitchen gabapentin (NEURONTIN) 100 MG capsule Take 100 mg by mouth 3 (three) times daily. Pain      . glipiZIDE (GLUCOTROL) 5 MG tablet Take 2.5 mg by mouth daily.      . hydrALAZINE (APRESOLINE) 50 MG tablet Take 50 mg by mouth 3 (three) times daily.      Marland Kitchen lisinopril (PRINIVIL,ZESTRIL) 20 MG tablet Take 20 mg by mouth daily.       No current facility-administered medications for this visit.    Allergies  Allergen Reactions  . Moxifloxacin     REACTION: hives  . Penicillins     REACTION: hives  . Prednisone     REACTION: hives    Review of Systems no change  BP 115/64  Pulse 74  Resp 18  Ht 5\' 5"  (1.651 m)  Wt 149 lb (67.586 kg)  BMI 24.79 kg/m2  SpO2 98% Physical Exam Lungs with scattered wheeze No adenopathy the neck Heart rhythm regular  Diagnostic Tests: Pathology report reviewed with patient and daughter  Impression: Probable early stage adenocarcinoma right lower lobe Not a surgical candidate for resection because of the above reasons Referred to oncology clinic for consideration of stereotactic radiation plus minus chemotherapy  Plan: Appointment made for patient October 2 at thoracic oncology clinic

## 2013-09-13 ENCOUNTER — Telehealth: Payer: Self-pay | Admitting: *Deleted

## 2013-09-13 NOTE — Telephone Encounter (Signed)
Spoke with pt daughter regarding appt time and place.  She verbalized understanding

## 2013-09-14 ENCOUNTER — Telehealth: Payer: Self-pay | Admitting: Internal Medicine

## 2013-09-14 NOTE — Telephone Encounter (Signed)
C/D 09/14/13 for appt. 09/16/13 °

## 2013-09-15 ENCOUNTER — Encounter: Payer: Self-pay | Admitting: *Deleted

## 2013-09-15 NOTE — Progress Notes (Signed)
Abstraction: Pt presented with abnormal CXR 2013.  Followed with scans.  08/25/2013 CT Chest: CT CHEST WITH CONTRAST  Technique: Multidetector CT imaging of the chest was performed  following the standard protocol during bolus administration of  intravenous contrast.  Contrast: 75mL OMNIPAQUE IOHEXOL 300 MG/ML SOLN  Comparison: 01/20/2013  Findings: There is no axillary lymphadenopathy. No mediastinal or  hilar lymphadenopathy. Heart size is normal. Coronary artery  calcification is noted. There is no pericardial or pleural  effusion.  Moderate hiatal hernia is evident.  Lung windows again shows the ground-glass nodule in the superior  segment the right lower lobe. When measuring this nodule in the  same axes and which was measured previously, the lesion today  measures 2.6 x 2.7 cm compared 2.4 x 2.4 cm previously. Overall  imaging character of the lesion is stable.  As noted previously, there is scattered peripheral nodularity of  peripheral right upper lobe. Overall imaging features are stable.  The scattered areas of airway impaction are unchanged. A tiny  cluster of tree in bud opacity in the central right upper lobe is  stable.  No change in the posterior right lower lobe 10 mm nodule on image  48.  The linear ground-glass attenuation in the lingula is unchanged and  likely reflects scarring.  Previously described 4 mm left lower lobe nodule is identified on  image 24 today and is associated with the major fissure. This is  unchanged. Other scattered tiny nodules are seen in the left upper  and lower lobes.  Images which include the upper abdomen show a 9 mm hypodensity in  the spleen, unchanged and likely benign. This is stable comparing  back to a study from 09/08/2008.  Bone windows reveal no worrisome lytic or sclerotic osseous  lesions.  IMPRESSION:  The dominant subsolid lesion and the right lower lobe has  progressed in the 24-month interval since the prior CT scan,   measuring 2.6 x 2.7 cm today compared 2.4 x 2.4 cm previously when  measuring in the same dimensions on both studies. As discussed  previously, adenocarcinoma remains a possibility.  Otherwise stable exam.  Original Report Authenticated By: Kennith Center, M.D.   Pathology: RLL highly atypical bronchial epithelium consistent with a well differentiated adenocarcinoma demonstrating prominent lepidic growth  PMH: CHF (congestive heart failure)  Hyperlipidemia  Hypertension  Diabetes mellitus  dx 10 plus yrs ago  Arthritis  Dilated cardiomyopathy  now resolved, EF 60-65% echo 10/11/11 (Dr. Anne Fu)  CAD (coronary artery disease)  mod 2V CAD (60% LAD, 70-80% mid CX) by 01/2008 cath  Lung mass Allergic rhinitis  Diverticulosis  Osteopenia MR (mitral regurgitation)   Medications:AmLODIPine Besylate (Tab) NORVASC 10 MG Take 10 mg by mouth daily. Aspirin (Tab) aspirin 81 MG Take 81 mg by mouth daily. Atorvastatin Calcium (Tab) LIPITOR 40 MG Take 40 mg by mouth daily. Carvedilol (Tab) COREG 25 MG Take 25 mg by mouth 2 (two) times daily with a meal. Furosemide (Tab) LASIX 40 MG Take 40 mg by mouth daily. Gabapentin (Cap) NEURONTIN 100 MG Take 100 mg by mouth 3 (three) times daily. Pain GlipiZIDE (Tab) GLUCOTROL 5 MG Take 2.5 mg by mouth daily. HydrALAZINE HCl (Tab) APRESOLINE 50 MG Take 50 mg by mouth 3 (three) times daily. Lisinopril (Tab) PRINIVIL,ZESTRIL 20 MG Take 20 mg by mouth daily.

## 2013-09-16 ENCOUNTER — Encounter: Payer: Self-pay | Admitting: Radiation Oncology

## 2013-09-16 ENCOUNTER — Ambulatory Visit: Payer: Medicare Other | Attending: Internal Medicine | Admitting: Physical Therapy

## 2013-09-16 ENCOUNTER — Ambulatory Visit
Admission: RE | Admit: 2013-09-16 | Discharge: 2013-09-16 | Disposition: A | Discharge status: Home / Self Care | Payer: Medicare Other | Source: Ambulatory Visit | Attending: Radiation Oncology | Admitting: Radiation Oncology

## 2013-09-16 ENCOUNTER — Encounter: Payer: Self-pay | Admitting: *Deleted

## 2013-09-16 ENCOUNTER — Other Ambulatory Visit (HOSPITAL_COMMUNITY)
Admission: RE | Admit: 2013-09-16 | Discharge: 2013-09-16 | Disposition: A | Discharge status: Home / Self Care | Payer: Medicare Other | Source: Ambulatory Visit | Attending: Cardiothoracic Surgery | Admitting: Cardiothoracic Surgery

## 2013-09-16 ENCOUNTER — Encounter: Payer: Self-pay | Admitting: Internal Medicine

## 2013-09-16 ENCOUNTER — Telehealth: Payer: Self-pay | Admitting: Internal Medicine

## 2013-09-16 ENCOUNTER — Telehealth: Payer: Self-pay | Admitting: *Deleted

## 2013-09-16 ENCOUNTER — Ambulatory Visit (HOSPITAL_BASED_OUTPATIENT_CLINIC_OR_DEPARTMENT_OTHER): Payer: Medicare Other | Admitting: Internal Medicine

## 2013-09-16 DIAGNOSIS — Z9181 History of falling: Secondary | ICD-10-CM | POA: Insufficient documentation

## 2013-09-16 DIAGNOSIS — C3490 Malignant neoplasm of unspecified part of unspecified bronchus or lung: Secondary | ICD-10-CM | POA: Insufficient documentation

## 2013-09-16 DIAGNOSIS — C3430 Malignant neoplasm of lower lobe, unspecified bronchus or lung: Secondary | ICD-10-CM

## 2013-09-16 DIAGNOSIS — I509 Heart failure, unspecified: Secondary | ICD-10-CM

## 2013-09-16 DIAGNOSIS — I10 Essential (primary) hypertension: Secondary | ICD-10-CM

## 2013-09-16 DIAGNOSIS — IMO0001 Reserved for inherently not codable concepts without codable children: Secondary | ICD-10-CM | POA: Insufficient documentation

## 2013-09-16 DIAGNOSIS — E119 Type 2 diabetes mellitus without complications: Secondary | ICD-10-CM

## 2013-09-16 NOTE — Progress Notes (Signed)
   Thoracic Treatment Summary Name:Joan Hunter Date:09/16/2013 DOB:[birth year] Your Medical Team Medical Oncologist: Dr. Arbutus Ped Radiation Oncologist: Dr. Kathrynn Running  Surgeon: Dr. Maren Beach Type and Stage of Lung Cancer Non-Small Cell Carcinoma: Adenocarcinoma Clinical Stage: I   Clinical stage is based on radiology exams.  Pathological stage will be determined after surgery.  Staging is based on the size of the tumor, involvement of lymph nodes or not, and whether or not the cancer center has spread. Recommendations Recommendations: Radiation therapy and followed by Medical Oncology  These recommendations are based on information available as of today's consult.  This is subject to change depending further testing or exams. Next Steps Next Step: 1. Set up for [facility] 10/06/13 at 10:30 2. Medical Oncology to set up follow up appointment with CT Chest scan  Barriers to Care What do you perceive as a potential barrier that may prevent you from receiving your treatment plan? Nothing perceived at this time.   Resources: NCI Booklet given and briefly explained Cancer Care [email or web address] [id number] Questions Willette Pa, RN BSN Thoracic Oncology Nurse Navigator at [phone]  Joan Hunter is a nurse navigator that is available to assist you through your cancer journey.  She can answer your questions and/or provide resources regarding your treatment plan, emotional support, or financial concerns.

## 2013-09-16 NOTE — Telephone Encounter (Signed)
Gave pt appt for lab and MD on January 2015 °

## 2013-09-16 NOTE — Progress Notes (Signed)
[facility] Clinical Social Work  Clinical Social Work met with patient, patient's daughter, and Systems developer at [facility] for assessment of psychosocial needs.  Ms. Zuniga is widowed and lives alone.  Patient's daughter serves as primary support and is very familiar with patient's medical history.  Ms. Loth works at [employer] in [employer] and shared that she really enjoys her job.  CSW briefly explained CSW role and support services and encouraged patient/family to call with questions or concerns.  Patient's daughter was appreciative of visit and plans to contact CSW as needed.    Kathrin Penner, MSW, LCSW Clinical Social Worker [facility] [phone]

## 2013-09-16 NOTE — Telephone Encounter (Signed)
Called and spoke with daughter regarding appt for SIM on 10/06/13 at 10:30. She verbalized understanding of time and place of appt

## 2013-09-16 NOTE — Progress Notes (Signed)
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 female who is underwent preoperative chest x-ray on 06/15/2012 revealed an abnormal density in the right upper lung. This prompted a chest CT on 06/17/2012. This study demonstrated a 2.3 cm groundglass nodule in the superior segment of the right lower lobe which in retrospect was present measuring 1.9 cm in September 2009. Consequently, PET scan was performed on 07/03/2012. The approximate 2.4 cm focal groundglass opacity had a maximum SUV of 2.3. This was suspicious for adenocarcinoma. CT-guided biopsy on 07/16/2012 which showed atypical epithelium with associated with chronic lymphoplasmacytic inflammation but no definitive malignancy. Followup chest CT 6 months later on 01/20/13 showed subtle increased in size of the right lower lung nodule. Further six-month followup chest CT on 08/25/2013 showed increasing size to 2.7 cm in maximal dimensions. CT-guided lung biopsy was repeated on 09/01/2013. At this point, the biopsy was positive for highly atypical bronchial epithelium consistent with well-differentiated adenocarcinoma demonstrating prominent lepidic growth.  The biopsy was notable for some minimal peritumoral intraparenchymal bleeding, somewhat obscuring the borders of the tumor on immediate post-biopsy CT.  PREVIOUS RADIATION THERAPY: No  PAST MEDICAL HISTORY:  has a past medical history of CHF (congestive heart failure);  Hyperlipidemia; Hypertension; Diabetes mellitus; Arthritis; Dilated cardiomyopathy; CAD (coronary artery disease); Lung mass; Allergic rhinitis; Diverticulosis; Osteopenia; and MR (mitral regurgitation).    PAST SURGICAL HISTORY: Past Surgical History  Procedure Laterality Date  . Abdominal hysterectomy    . Appendectomy    . Knee arthroscopy      ?  left  knee  . Eye surgery      bil cataracts  . Tonsillectomy      FAMILY HISTORY: family history includes Colon cancer in her mother; Heart disease in her brother.  SOCIAL HISTORY:  reports that she quit smoking about 41 years ago. Her smoking use included Cigarettes. She smoked 0.50 packs per day. She does not have any smokeless tobacco history on file. She reports that she does not drink alcohol or use illicit drugs.  ALLERGIES: Moxifloxacin; Penicillins; and Prednisone  MEDICATIONS:  Current Outpatient Prescriptions  Medication Sig Dispense Refill  . amLODipine (NORVASC) 10 MG tablet Take 10 mg by mouth daily.      Marland Kitchen aspirin 81 MG tablet Take 81 mg by mouth daily.      Marland Kitchen atorvastatin (LIPITOR) 40 MG tablet Take 40 mg by mouth daily.      . carvedilol (COREG) 25 MG tablet Take 25 mg by mouth 2 (two) times daily with a meal.      . furosemide (LASIX) 40 MG tablet Take 40 mg by mouth daily.      Marland Kitchen gabapentin (NEURONTIN) 100 MG capsule Take 100 mg by mouth 3 (three) times daily. Pain      . glipiZIDE (GLUCOTROL) 5 MG tablet Take 2.5 mg by mouth daily.      . hydrALAZINE (APRESOLINE) 50 MG tablet Take 50 mg by mouth 3 (three) times daily.      Marland Kitchen lisinopril (  PRINIVIL,ZESTRIL) 20 MG tablet Take 20 mg by mouth daily.       No current facility-administered medications for this encounter.    REVIEW OF SYSTEMS:  A 15 point review of systems is documented in the electronic medical record. This was obtained by the nursing staff. However, I reviewed this with the patient to discuss relevant findings and make appropriate changes.  A  comprehensive review of systems was negative.   PHYSICAL EXAM: per chart 9/17 Constitutional: She is oriented to person, place, and time.  Cardiovascular: Normal rate and regular rhythm. Respiratory: Effort normal and breath sounds normal. She has no wheezes. GI: Soft. Bowel sounds are normal. There is no tenderness.  Musculoskeletal: Normal range of motion. She exhibits no edema. Uses cane Neurological: She is alert and oriented to person, place, and time. Skin: Skin is warm and dry.  Psychiatric: She has a normal mood and affect. Her behavior is normal. Judgment and thought content normal.   KPS = 90  100 - Normal; no complaints; no evidence of disease. 90   - Able to carry on normal activity; minor signs or symptoms of disease. 80   - Normal activity with effort; some signs or symptoms of disease. 25   - Cares for self; unable to carry on normal activity or to do active work. 60   - Requires occasional assistance, but is able to care for most of his personal needs. 50   - Requires considerable assistance and frequent medical care. 40   - Disabled; requires special care and assistance. 30   - Severely disabled; hospital admission is indicated although death not imminent. 20   - Very sick; hospital admission necessary; active supportive treatment necessary. 10   - Moribund; fatal processes progressing rapidly. 0     - Dead  Karnofsky DA, Abelmann WH, Craver LS and Burchenal [facility] [phone]) The use of the nitrogen mustards in the palliative treatment of carcinoma: with particular reference to bronchogenic carcinoma Cancer 1 634-56  LABORATORY DATA:  Lab Results  Component Value Date   WBC 7.8 09/01/2013   HGB 12.3 09/01/2013   HCT 37.7 09/01/2013   MCV 88.7 09/01/2013   PLT 197 09/01/2013   Lab Results  Component Value Date   NA 142 06/15/2012   K 3.8 06/15/2012   CL 104 06/15/2012   CO2 25 06/15/2012   Lab Results  Component Value Date   ALT 11 06/15/2012   AST 20 06/15/2012   ALKPHOS 81  06/15/2012   BILITOT 0.5 06/15/2012     RADIOGRAPHY: Dg Chest 1 View  09/02/2013   CLINICAL DATA:  Status post right lung biopsy.  EXAM: CHEST - 1 VIEW  COMPARISON:  CT chest 08/25/2013 and plain film chest 02/09/2013.  FINDINGS: There is hazy opacity in the right mid lung zone consistent with the ground-glass attenuation lesion seen on CT scan and likely some pulmonary hemorrhage associated with biopsy. No pneumothorax is identified. The left lung is clear. Small hiatal hernia is noted. There is cardiomegaly.  IMPRESSION: 1. Status post right lung biopsy. Negative for pneumothorax. 2. Hazy opacity right mid lung zone consistent with lesion seen on CT scan and likely some hemorrhage associated with the biopsy. 3. Small hiatal hernia.   Electronically Signed   By: Drusilla Kanner M.D.   On: 09/02/2013 07:45   Ct Chest W Contrast  08/25/2013   *RADIOLOGY REPORT*  Clinical Data: 74-month follow-up for lung mass. Subsolid lesion in the right lower lobe without  hypermetabolism on PET CT of 07/03/2012.  The patient had CT-guided biopsy on 07/16/2012 which returned no malignant cells.  CT CHEST WITH CONTRAST  Technique:  Multidetector CT imaging of the chest was performed following the standard protocol during bolus administration of intravenous contrast.  Contrast: 75mL OMNIPAQUE IOHEXOL 300 MG/ML  SOLN  Comparison: 01/20/2013  Findings: There is no axillary lymphadenopathy.  No mediastinal or hilar lymphadenopathy.  Heart size is normal. Coronary artery calcification is noted.  There is no pericardial or pleural effusion.  Moderate hiatal hernia is evident.  Lung windows again shows the ground-glass nodule in the superior segment the right lower lobe.  When measuring this nodule in the same axes and which was measured previously, the lesion today measures 2.6 x 2.7 cm compared 2.4 x 2.4 cm previously.  Overall imaging character of the lesion is stable.  As noted previously, there is scattered peripheral nodularity of  peripheral right upper lobe.  Overall imaging features are stable. The scattered areas of airway impaction are unchanged.  A tiny cluster of tree in bud opacity in the central right upper lobe is stable.  No change in the posterior right lower lobe 10 mm nodule on image 48.  The linear ground-glass attenuation in the lingula is unchanged and likely reflects scarring.  Previously described 4 mm left lower lobe nodule is identified on image 24 today and is associated with the major fissure.  This is unchanged.  Other scattered tiny nodules are seen in the left upper and lower lobes.  Images which include the upper abdomen show a 9 mm hypodensity in the spleen, unchanged and likely benign.  This is stable comparing back to a study from 09/08/2008.  Bone windows reveal no worrisome lytic or sclerotic osseous lesions.  IMPRESSION:  The dominant subsolid lesion and the right lower lobe has progressed in the 60-month interval since the prior CT scan, measuring 2.6 x 2.7 cm today compared 2.4 x 2.4 cm previously when measuring in the same dimensions on both studies. As discussed previously, adenocarcinoma remains a possibility.  Otherwise stable exam.   Original Report Authenticated By: Kennith Center, M.D.   Ct Biopsy  09/01/2013   CLINICAL DATA:  Right lower lobe lung nodule  EXAM: CT-GUIDED BIOPSY of a right lower lobe lung nodule  MEDICATIONS AND MEDICAL HISTORY: Versed 3.0 mg, Fentanyl 125 mcg.  ANESTHESIA/SEDATION: Moderate sedation time: 20 minutes  PROCEDURE: The procedure, risks, benefits, and alternatives were explained to the patient. Questions regarding the procedure were encouraged and answered. The patient understands and consents to the procedure.  The right posterior thorax was prepped with Betadine in a sterile fashion, and a sterile drape was applied covering the operative field. A surgical mask and sterile gloves were used for the procedure.  Under CT guidance, a(n) 17 gauge guide needle was advanced into  the right lower lobe lung nodule. Subsequently 3 18 gauge core biopsies were obtained. The guide needle was removed. Final imaging was performed.  Patient tolerated the procedure well without complication. Vital sign monitoring by nursing staff during the procedure will continue as patient is in the special procedures unit for post procedure observation.  FINDINGS: The images document guide needle placement within the right lower lobe lung nodule. Post biopsy images demonstrate no pneumothorax.  IMPRESSION: Successful CT-guided right lower lobe lung nodule core biopsy.   Electronically Signed   By: Maryclare Bean M.D.   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 She's not ideal surgical candidate given her advanced age. She would however potentially be an excellent candidate for stereotactic body radiotherapy for definitive local control.  PLAN:Today, I talked to the patient and family about the findings and work-up thus far.  We discussed the natural history of disease and general treatment, highlighting the role or radiotherapy in the management.  We discussed the available radiation techniques, and focused on the details of logistics and delivery.  We reviewed the anticipated acute and late sequelae associated with radiation in this setting.  The patient was encouraged to ask questions that I answered to the best of my ability.  I filled out a patient counseling form during our discussion including treatment diagrams.  We retained a copy for our records.  The patient would like to proceed with radiation and will be scheduled for CT simulation on 10/22 to allow for the light parenchymal bleeding associated with the CT biopsy to resolve..  I spent 60 minutes minutes face to face with the patient and more than 50% of that time was spent  in counseling and/or coordination of care.    ------------------------------------------------  Artist Pais. Kathrynn Running, M.D.   Reference: [email or web address]

## 2013-09-16 NOTE — Progress Notes (Signed)
[facility] [phone]   Fax:([phone]) [phone] Multidisciplinary thoracic oncology clinic (MTOC)  CONSULT NOTE  REFERRING PHYSICIAN: Dr. Kathlee Nations Trigt  REASON FOR CONSULTATION:  79 years old white female recently diagnosed with lung cancer  HPI Joan Hunter is a 79 y.o. female with past medical history significant for multiple medical problems including congestive heart failure with dilated cardiomyopathy, hypertension, dyslipidemia, diabetes mellitus, arthritis as well as coronary heart disease and the remote history of smoking but quit in 1973. The patient had preoperative chest x-rayon 06/15/2012 for knee surgery. It showed abnormal density in the right upper lobe. This was followed by CT scan of the chest on 06/17/2012 and it showed 2.3 x 2.1 CM ground glass nodule in the superior segment of the right lower lobe previously measured 1.7 x 1.9 CM. The patient was referred to Dr. Donata Clay and a PET scan on 07/03/2012. It showed the approximately 2.4 x 1.8 cm focal ground-glass opacity in the superior segment of the right lower lobe demonstrates no increased metabolic activity. SUV max is similar to background lung, approximately 2.3. On 07/16/2012, the patient underwent CT guided biopsy of the right lung lesion. The final pathology showed atypical in proceeding with associated market chronic lymphoplasmacytic inflammation. The patient was followed by observation and repeat scan every 6 months. CT scan of the chest on 08/25/2013 showed the dominant soft solid lesion and the right lower lobe has progressed in the six-month interval since the prior CT scan, measuring 2.6 x 2.7 CM compared to 2.4 x 2.4 CM previously. Repeat CT guided biopsy of the right lower lobe lung nodule by interventional radiology was performed on 09/01/2013.  The final pathology (Accession: [id number]) showed highly atypical bronchial epithelium consistent with a well-differentiated  adenocarcinoma demonstrating prominent lipidic growth.  The patient is not a surgical candidate for resection of her tumor because of her age and other comorbidities. Dr. Donata Clay kindly referred her to me today for evaluation and recommendation regarding treatment of her condition. The patient was seen earlier today by Dr. Kathrynn Running for consideration of stereotactic radiotherapy to her right lung lesion. When seen today she has no complaints except for mild fatigue. The patient denied having any significant chest pain but has shortness breath with exertion and mild cough with no hemoptysis. She denied having any significant weight loss or night sweats. The patient denied having any nausea or vomiting. She has no headache or blurry vision.  @SFHPI @  Past Medical History  Diagnosis Date  . CHF (congestive heart failure)   . Hyperlipidemia   . Hypertension   . Diabetes mellitus     dx 10 plus yrs ago  . Arthritis   . Dilated cardiomyopathy     now resolved, EF 60-65% echo 10/11/11 (Dr. Anne Fu)  . CAD (coronary artery disease)     mod 2V CAD (60% LAD, 70-80% mid CX) by 01/2008 cath  . Lung mass   . Allergic rhinitis   . Diverticulosis   . Osteopenia   . MR (mitral regurgitation)     Past Surgical History  Procedure Laterality Date  . Abdominal hysterectomy    . Appendectomy    . Knee arthroscopy      ?  left  knee  . Eye surgery      bil cataracts  . Tonsillectomy      Family History  Problem Relation Age of Onset  . Colon cancer Mother   . Heart disease Brother  Social History History  Substance Use Topics  . Smoking status: Former Smoker -- 0.50 packs/day    Types: Cigarettes    Quit date: 06/15/1972  . Smokeless tobacco: Not on file  . Alcohol Use: No    Allergies  Allergen Reactions  . Moxifloxacin     REACTION: hives  . Penicillins     REACTION: hives  . Prednisone     REACTION: hives    Current Outpatient Prescriptions  Medication Sig Dispense Refill   . amLODipine (NORVASC) 10 MG tablet Take 10 mg by mouth daily.      Marland Kitchen aspirin 81 MG tablet Take 81 mg by mouth daily.      Marland Kitchen atorvastatin (LIPITOR) 40 MG tablet Take 40 mg by mouth daily.      . carvedilol (COREG) 25 MG tablet Take 25 mg by mouth 2 (two) times daily with a meal.      . furosemide (LASIX) 40 MG tablet Take 40 mg by mouth daily.      Marland Kitchen gabapentin (NEURONTIN) 100 MG capsule Take 100 mg by mouth 3 (three) times daily. Pain      . glipiZIDE (GLUCOTROL) 5 MG tablet Take 2.5 mg by mouth daily.      . hydrALAZINE (APRESOLINE) 50 MG tablet Take 50 mg by mouth 3 (three) times daily.      Marland Kitchen lisinopril (PRINIVIL,ZESTRIL) 20 MG tablet Take 20 mg by mouth daily.       No current facility-administered medications for this visit.    Review of Systems  Constitutional: positive for fatigue Eyes: negative Ears, nose, mouth, throat, and face: negative Respiratory: positive for cough and dyspnea on exertion Cardiovascular: negative Gastrointestinal: negative Genitourinary:negative Integument/breast: negative Hematologic/lymphatic: negative Musculoskeletal:negative Neurological: negative Behavioral/Psych: negative Endocrine: negative Allergic/Immunologic: negative  Physical Exam  ZOX:WRUEA, healthy, no distress, well nourished and well developed SKIN: skin color, texture, turgor are normal HEAD: Normocephalic, No masses, lesions, tenderness or abnormalities EYES: normal, PERRLA EARS: External ears normal OROPHARYNX:no exudate, no erythema and lips, buccal mucosa, and tongue normal  NECK: supple, no adenopathy LYMPH:  no palpable lymphadenopathy, no hepatosplenomegaly BREAST:not examined LUNGS: clear to auscultation , and palpation HEART: regular rate & rhythm, no murmurs and no gallops ABDOMEN:abdomen soft, non-tender, normal bowel sounds and no masses or organomegaly BACK: Back symmetric, no curvature., No CVA tenderness EXTREMITIES:no joint deformities, effusion, or  inflammation, no edema, no skin discoloration  NEURO: alert & oriented x 3 with fluent speech, no focal motor/sensory deficits  PERFORMANCE STATUS: ECOG 1  LABORATORY DATA: Lab Results  Component Value Date   WBC 7.8 09/01/2013   HGB 12.3 09/01/2013   HCT 37.7 09/01/2013   MCV 88.7 09/01/2013   PLT 197 09/01/2013      Chemistry      Component Value Date/Time   NA 142 06/15/2012 0943   K 3.8 06/15/2012 0943   CL 104 06/15/2012 0943   CO2 25 06/15/2012 0943   BUN 17 08/05/2013 1500   CREATININE 0.91 08/05/2013 1457   CREATININE 0.80 06/15/2012 0943      Component Value Date/Time   CALCIUM 9.3 06/15/2012 0943   ALKPHOS 81 06/15/2012 0943   AST 20 06/15/2012 0943   ALT 11 06/15/2012 0943   BILITOT 0.5 06/15/2012 0943       RADIOGRAPHIC STUDIES: Dg Chest 1 View  09/02/2013   CLINICAL DATA:  Status post right lung biopsy.  EXAM: CHEST - 1 VIEW  COMPARISON:  CT chest 08/25/2013 and plain film chest  02/09/2013.  FINDINGS: There is hazy opacity in the right mid lung zone consistent with the ground-glass attenuation lesion seen on CT scan and likely some pulmonary hemorrhage associated with biopsy. No pneumothorax is identified. The left lung is clear. Small hiatal hernia is noted. There is cardiomegaly.  IMPRESSION: 1. Status post right lung biopsy. Negative for pneumothorax. 2. Hazy opacity right mid lung zone consistent with lesion seen on CT scan and likely some hemorrhage associated with the biopsy. 3. Small hiatal hernia.   Electronically Signed   By: Drusilla Kanner M.D.   On: 09/02/2013 07:45   Ct Chest W Contrast  08/25/2013   *RADIOLOGY REPORT*  Clinical Data: 14-month follow-up for lung mass. Subsolid lesion in the right lower lobe without hypermetabolism on PET CT of 07/03/2012.  The patient had CT-guided biopsy on 07/16/2012 which returned no malignant cells.  CT CHEST WITH CONTRAST  Technique:  Multidetector CT imaging of the chest was performed following the standard protocol during bolus  administration of intravenous contrast.  Contrast: 75mL OMNIPAQUE IOHEXOL 300 MG/ML  SOLN  Comparison: 01/20/2013  Findings: There is no axillary lymphadenopathy.  No mediastinal or hilar lymphadenopathy.  Heart size is normal. Coronary artery calcification is noted.  There is no pericardial or pleural effusion.  Moderate hiatal hernia is evident.  Lung windows again shows the ground-glass nodule in the superior segment the right lower lobe.  When measuring this nodule in the same axes and which was measured previously, the lesion today measures 2.6 x 2.7 cm compared 2.4 x 2.4 cm previously.  Overall imaging character of the lesion is stable.  As noted previously, there is scattered peripheral nodularity of peripheral right upper lobe.  Overall imaging features are stable. The scattered areas of airway impaction are unchanged.  A tiny cluster of tree in bud opacity in the central right upper lobe is stable.  No change in the posterior right lower lobe 10 mm nodule on image 48.  The linear ground-glass attenuation in the lingula is unchanged and likely reflects scarring.  Previously described 4 mm left lower lobe nodule is identified on image 24 today and is associated with the major fissure.  This is unchanged.  Other scattered tiny nodules are seen in the left upper and lower lobes.  Images which include the upper abdomen show a 9 mm hypodensity in the spleen, unchanged and likely benign.  This is stable comparing back to a study from 09/08/2008.  Bone windows reveal no worrisome lytic or sclerotic osseous lesions.  IMPRESSION:  The dominant subsolid lesion and the right lower lobe has progressed in the 10-month interval since the prior CT scan, measuring 2.6 x 2.7 cm today compared 2.4 x 2.4 cm previously when measuring in the same dimensions on both studies. As discussed previously, adenocarcinoma remains a possibility.  Otherwise stable exam.   Original Report Authenticated By: Kennith Center, M.D.   Ct  Biopsy  09/01/2013   CLINICAL DATA:  Right lower lobe lung nodule  EXAM: CT-GUIDED BIOPSY of a right lower lobe lung nodule  MEDICATIONS AND MEDICAL HISTORY: Versed 3.0 mg, Fentanyl 125 mcg.  ANESTHESIA/SEDATION: Moderate sedation time: 20 minutes  PROCEDURE: The procedure, risks, benefits, and alternatives were explained to the patient. Questions regarding the procedure were encouraged and answered. The patient understands and consents to the procedure.  The right posterior thorax was prepped with Betadine in a sterile fashion, and a sterile drape was applied covering the operative field. A surgical mask and sterile gloves  were used for the procedure.  Under CT guidance, a(n) 17 gauge guide needle was advanced into the right lower lobe lung nodule. Subsequently 3 18 gauge core biopsies were obtained. The guide needle was removed. Final imaging was performed.  Patient tolerated the procedure well without complication. Vital sign monitoring by nursing staff during the procedure will continue as patient is in the special procedures unit for post procedure observation.  FINDINGS: The images document guide needle placement within the right lower lobe lung nodule. Post biopsy images demonstrate no pneumothorax.  IMPRESSION: Successful CT-guided right lower lobe lung nodule core biopsy.   Electronically Signed   By: Maryclare Bean M.D.   On: 09/01/2013 16:12    ASSESSMENT: This is a very pleasant 79 years old white female recently diagnosed with a stage IA (T1b, N0, M0) non-small cell lung cancer consistent with adenocarcinoma involving the right lower lobe. The patient is not a surgical candidate for resection because of her comorbidities.   PLAN: I had a lengthy discussion with the patient and her family today about her current disease stage, prognosis and treatment options. I recommended for the patient to proceed with the stereotactic radiotherapy as recommended by Dr. Kathrynn Running. I will follow the patient by  observation and repeat CT scan of the chest in 3 months for reevaluation of the treatment effect and restaging of her disease. I do not think the patient will need any adjuvant chemotherapy at this point because of her early stage of the disease. I gave the patient and her family that time to ask questions and I answered them completely to their satisfaction. She was advised to call immediately if she has any concerning symptoms in the interval.  The patient voices understanding of current disease status and treatment options and is in agreement with the current care plan.  All questions were answered. The patient knows to call the clinic with any problems, questions or concerns. We can certainly see the patient much sooner if necessary.  Thank you so much for allowing me to participate in the care of ALONNI HEIMSOTH. I will continue to follow up the patient with you and assist in her care.  I spent 35 minutes counseling the patient face to face. The total time spent in the appointment was 55 minutes.  Annakate Soulier K. 09/16/2013, 2:17 PM

## 2013-09-18 NOTE — Patient Instructions (Signed)
Proceed with the stereotactic radiotherapy to the right lower lobe lung mass as recommended by Dr. Kathrynn Running. Follow up visit in 3 months with repeat CT scan of the chest.

## 2013-10-01 ENCOUNTER — Telehealth: Payer: Self-pay | Admitting: Cardiology

## 2013-10-01 MED ORDER — LISINOPRIL 20 MG PO TABS: 20.0000 mg | ORAL_TABLET | Freq: Every day | ORAL | Status: DC

## 2013-10-01 NOTE — Telephone Encounter (Signed)
Follow up    Refill lisinopril-----[facility]

## 2013-10-06 ENCOUNTER — Ambulatory Visit
Admission: RE | Admit: 2013-10-06 | Discharge: 2013-10-06 | Disposition: A | Discharge status: Home / Self Care | Payer: Medicare Other | Source: Ambulatory Visit | Attending: Radiation Oncology | Admitting: Radiation Oncology

## 2013-10-06 ENCOUNTER — Other Ambulatory Visit: Payer: Self-pay | Admitting: Radiation Oncology

## 2013-10-06 ENCOUNTER — Encounter: Payer: Self-pay | Admitting: Radiation Oncology

## 2013-10-06 DIAGNOSIS — Z510 Encounter for antineoplastic radiation therapy: Secondary | ICD-10-CM | POA: Insufficient documentation

## 2013-10-06 DIAGNOSIS — D0220 Carcinoma in situ of unspecified bronchus and lung: Secondary | ICD-10-CM | POA: Insufficient documentation

## 2013-10-06 NOTE — Progress Notes (Signed)
  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  Identity was confirmed.  All relevant records and images related to the planned course of therapy were reviewed.  The patient freely provided informed written consent to proceed with treatment after reviewing the details related to the planned course of therapy. The consent form was witnessed and verified by the simulation staff.  Then, the patient was set-up in a stable reproducible  supine position for radiation therapy.  A BodyFix immobilization pillow was fabricated for reproducible positioning.  Then I personally applied the abdominal compression paddle to limit respiratory excursion.  4D respiratoy motion management CT images were obtained.  Surface markings were placed.  The CT images were loaded into the planning software.  Then, using Cine, MIP, and standard views, the internal target volume (ITV) and planning target volumes (PTV) were delinieated, and avoidance structures were contoured.  Treatment planning then occurred.  The radiation prescription was entered and confirmed.  A total of two complex treatment devices were fabricated in the form of the BodyFix immobilization pillow and a neck accuform cushion.  I have requested : 3D Simulation  I have requested a DVH of the following structures: Heart, Lungs, Esophagus, Chest Wall, Brachial Plexus, Major Blood Vessels, and targets.  PLAN:  The patient will receive 54 Gy in 3 fractions.  ________________________________  [name], M.D.

## 2013-10-06 NOTE — Progress Notes (Signed)
Reports shortness of breath with exertion. Reports an occasional dry cough. Denies pain or difficulty swallowing. Denies chest pain. Denies hx of radiation therapy. Denies having a pacemaker.

## 2013-10-14 ENCOUNTER — Encounter: Payer: Self-pay | Admitting: Radiation Oncology

## 2013-10-20 ENCOUNTER — Ambulatory Visit
Admission: RE | Admit: 2013-10-20 | Discharge: 2013-10-20 | Disposition: A | Discharge status: Home / Self Care | Payer: Medicare Other | Source: Ambulatory Visit | Attending: Radiation Oncology | Admitting: Radiation Oncology

## 2013-10-20 ENCOUNTER — Encounter: Payer: Self-pay | Admitting: Radiation Oncology

## 2013-10-20 MED ORDER — RADIAPLEXRX EX GEL
Freq: Once | CUTANEOUS | Status: AC
  Administered 2013-10-20: 16:00:00 via TOPICAL

## 2013-10-20 NOTE — Progress Notes (Signed)
  Radiation Oncology         (336) [phone] ________________________________  Name: Joan Hunter MRN: [id number]  Date: 10/20/2013  DOB: 1927-07-04  Stereotactic Radiation Therapy Management  Current Dose: 12 Gy     Planned Dose:  60 Gy  Narrative . . . . . . . . The patient presents for routine under treatment assessment.                                   The patient is without complaint.                                 Set-up films were reviewed.                                 The chart was checked. Physical Findings. . .  weight is 150 lb 6.4 oz (68.221 kg). Her oral temperature is 97.5 F (36.4 C). Her blood pressure is 131/72 and her pulse is 85. Her respiration is 18 and oxygen saturation is 99%. . Weight essentially stable.  No significant changes. Impression . . . . . . . The patient is tolerating radiation. Plan . . . . . . . . . . . . Continue treatment as planned.  ________________________________  Artist Pais. Kathrynn Running, M.D.

## 2013-10-20 NOTE — Addendum Note (Signed)
Encounter addended by: Agnes Lawrence, RN on: 10/20/2013  4:23 PM<BR>     Documentation filed: Inpatient Document Flowsheet, Inpatient Patient Education, Orders

## 2013-10-20 NOTE — Progress Notes (Signed)
Denies painful or difficult swallowing. Reports shortness of breath with exertion. Reports an occasional dry cough. Denies chest pain. Provided patient with radiaplex gel and directed upon use. Oriented patient to staff and routine of the clinic. Educated patient reference potential side effects and management such as, fatigue and skin changes. Provided patient with RADIATION THERAPY AND YOU handbook then, reviewed pertinent information.  All questions answered. Patient verbalized understanding.

## 2013-10-20 NOTE — Addendum Note (Signed)
Encounter addended by: Agnes Lawrence, RN on: 10/20/2013  4:25 PM<BR>     Documentation filed: Inpatient MAR

## 2013-10-20 NOTE — Progress Notes (Signed)
  Radiation Oncology         [phone]) [phone] ________________________________  Name: LALANA WACHTER MRN: [id number]  Date: 10/20/2013  DOB: 02/06/1927  Stereotactic Body Radiotherapy Treatment Procedure Note  NARRATIVE:  SHARMANE DAME was brought to the stereotactic radiation treatment machine and placed supine on the CT couch. The patient was set up for stereotactic body radiotherapy on the body fix pillow.  3D TREATMENT PLANNING AND DOSIMETRY:  The patient's radiation plan was reviewed and approved prior to starting treatment.  It showed 3-dimensional radiation distributions overlaid onto the planning CT.  The [facility] for the target structures as well as the organs at risk were reviewed. The documentation of this is filed in the radiation oncology EMR.  SIMULATION VERIFICATION:  The patient underwent CT imaging on the treatment unit.  These were carefully aligned to document that the ablative radiation dose would cover the target volume and maximally spare the nearby organs at risk according to the planned distribution.  SPECIAL TREATMENT PROCEDURE: [name] received high dose ablative stereotactic body radiotherapy to the planned target volume without unforeseen complications. Treatment was delivered uneventfully. The high doses associated with stereotactic body radiotherapy and the significant potential risks require careful treatment set up and patient monitoring constituting a special treatment procedure   STEREOTACTIC TREATMENT MANAGEMENT:  Following delivery, the patient was evaluated clinically. The patient tolerated treatment without significant acute effects, and was discharged to home in stable condition.    PLAN: Continue treatment as planned.  ________________________________  Artist Pais. Kathrynn Running, M.D.

## 2013-10-22 ENCOUNTER — Ambulatory Visit: Payer: Medicare Other | Admitting: Radiation Oncology

## 2013-10-22 ENCOUNTER — Encounter (HOSPITAL_COMMUNITY): Payer: Self-pay

## 2013-10-22 ENCOUNTER — Ambulatory Visit
Admission: RE | Admit: 2013-10-22 | Discharge: 2013-10-22 | Disposition: A | Discharge status: Home / Self Care | Payer: Medicare Other | Source: Ambulatory Visit | Attending: Radiation Oncology | Admitting: Radiation Oncology

## 2013-10-22 ENCOUNTER — Encounter: Payer: Self-pay | Admitting: Radiation Oncology

## 2013-10-25 ENCOUNTER — Ambulatory Visit
Admission: RE | Admit: 2013-10-25 | Discharge: 2013-10-25 | Disposition: A | Discharge status: Home / Self Care | Payer: Medicare Other | Source: Ambulatory Visit | Attending: Radiation Oncology | Admitting: Radiation Oncology

## 2013-10-25 ENCOUNTER — Encounter: Payer: Self-pay | Admitting: Radiation Oncology

## 2013-10-25 NOTE — Progress Notes (Signed)
  Radiation Oncology         (336) [phone] ________________________________  Name: Joan Hunter MRN: [id number]  Date: 10/25/2013  DOB: [birth year]  Stereotactic Body Radiotherapy Treatment Procedure Note  NARRATIVE:  Joan Hunter was brought to the stereotactic radiation treatment machine and placed supine on the CT couch. The patient was set up for stereotactic body radiotherapy on the body fix pillow.  3D TREATMENT PLANNING AND DOSIMETRY:  The patient's radiation plan was reviewed and approved prior to starting treatment.  It showed 3-dimensional radiation distributions overlaid onto the planning CT.  The [facility] for the target structures as well as the organs at risk were reviewed. The documentation of this is filed in the radiation oncology EMR.  SIMULATION VERIFICATION:  The patient underwent CT imaging on the treatment unit.  These were carefully aligned to document that the ablative radiation dose would cover the right lung target volume and maximally spare the nearby organs at risk according to the planned distribution.  SPECIAL TREATMENT PROCEDURE: Joan Hunter received high dose ablative stereotactic body radiotherapy to the planned right lung target volume without unforeseen complications. Treatment was delivered uneventfully. She received an additional 1200 cGy for cumulative dose of 3600 cGy in 3 sessions of a prescribed 6000 cGy in 5 sessions The high doses associated with stereotactic body radiotherapy and the significant potential risks require careful treatment set up and patient monitoring constituting a special treatment procedure   STEREOTACTIC TREATMENT MANAGEMENT:  Following delivery, the patient was evaluated clinically. The patient tolerated treatment without significant acute effects, and was discharged to home in stable condition.    PLAN: Continue treatment as planned.  ------------------------------------------------        Maryln Gottron, MD

## 2013-10-27 ENCOUNTER — Ambulatory Visit
Admission: RE | Admit: 2013-10-27 | Discharge: 2013-10-27 | Disposition: A | Discharge status: Home / Self Care | Payer: Medicare Other | Source: Ambulatory Visit | Attending: Radiation Oncology | Admitting: Radiation Oncology

## 2013-10-27 ENCOUNTER — Ambulatory Visit: Payer: Medicare Other | Admitting: Radiation Oncology

## 2013-10-27 NOTE — Progress Notes (Signed)
  Radiation Oncology         [phone]) [phone] ________________________________  Name: Joan Hunter MRN: [id number]  Date: 10/27/2013  DOB: 10/06/1927  Stereotactic Body Radiotherapy Treatment Procedure Note  NARRATIVE:  Joan Hunter was brought to the stereotactic radiation treatment machine and placed supine on the CT couch. The patient was set up for stereotactic body radiotherapy on the body fix pillow.  3D TREATMENT PLANNING AND DOSIMETRY:  The patient's radiation plan was reviewed and approved prior to starting treatment.  It showed 3-dimensional radiation distributions overlaid onto the planning CT.  The [facility] for the target structures as well as the organs at risk were reviewed. The documentation of this is filed in the radiation oncology EMR.  SIMULATION VERIFICATION:  The patient underwent CT imaging on the treatment unit.  These were carefully aligned to document that the ablative radiation dose would cover the target volume and maximally spare the nearby organs at risk according to the planned distribution.  SPECIAL TREATMENT PROCEDURE: Joan Hunter received high dose ablative stereotactic body radiotherapy to the planned target volume without unforeseen complications. Treatment was delivered uneventfully. The high doses associated with stereotactic body radiotherapy and the significant potential risks require careful treatment set up and patient monitoring constituting a special treatment procedure   STEREOTACTIC TREATMENT MANAGEMENT:  Following delivery, the patient was evaluated clinically. The patient tolerated treatment without significant acute effects, and was discharged to home in stable condition.    PLAN: Continue treatment as planned.  ________________________________  Billie Lade, PhD, MD

## 2013-10-29 ENCOUNTER — Ambulatory Visit
Admission: RE | Admit: 2013-10-29 | Discharge: 2013-10-29 | Disposition: A | Discharge status: Home / Self Care | Payer: Medicare Other | Source: Ambulatory Visit | Attending: Radiation Oncology | Admitting: Radiation Oncology

## 2013-10-29 ENCOUNTER — Encounter: Payer: Self-pay | Admitting: Radiation Oncology

## 2013-10-30 NOTE — Progress Notes (Signed)
  Radiation Oncology         (336) [phone] ________________________________  Name: Joan Hunter MRN: [id number]  Date: 10/29/2013  DOB: Oct 25, 1927   Simulation verification note  The patient underwent film verification for the patient's set-up in preparation for stereotactic body radiosurgery. The patient was placed on the treatment unit and a CT scan was performed. These images were then fused with the patient's planning CT scan. The fusion was carefully reviewed in terms of the patient's anatomy as it related to the planning CT scan. The target structures as well as the organs at risk were evaluated on the patient's treatment CT scan. The target and the normal structures were appropriately aligned for treatment. Therefore the patient proceeded with the fraction of stereotactic body radiosurgery.  Fraction: 5  Dose:  60 Gy   ________________________________  Radene Gunning, MD, PhD

## 2013-11-08 NOTE — Progress Notes (Signed)
  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  Beams/energy:   The patient received 3D conformal radiotherapy using volumetric arc therapy techniques with 2 arcs delivering 6 MV X-rays.  Narrative: The patient tolerated radiation treatment relatively well.   No acute complications occurred.  Plan: The patient has completed radiation treatment. The patient will return to radiation oncology clinic for routine followup in one month. I advised them to call or return sooner if they have any questions or concerns related to their recovery or treatment. ________________________________  Artist Pais. Kathrynn Running, M.D.

## 2013-11-14 NOTE — Progress Notes (Signed)
  Radiation Oncology         [phone]) [phone] ________________________________  Name: Joan Hunter MRN: [id number]  Date: 10/14/2013  DOB: 1927/04/11  RESPIRATORY MOTION MANAGEMENT SIMULATION  NARRATIVE:  In order to account for effect of respiratory motion on target structures and other organs in the planning and delivery of radiotherapy, this patient underwent respiratory motion management simulation.  To accomplish this, when the patient was brought to the CT simulation planning suite, 4D respiratoy motion management CT images were obtained.  The CT images were loaded into the planning software.  Then, using a variety of tools including Cine, MIP, and standard views, the target volume and planning target volumes (PTV) were delineated.  Avoidance structures were contoured.  Treatment planning then occurred.  Dose volume histograms were generated and reviewed for each of the requested structure.  The resulting plan was carefully reviewed and approved today. ------------------------------------------------  Artist Pais. Kathrynn Running, M.D.

## 2013-11-14 NOTE — Progress Notes (Signed)
  Radiation Oncology         [phone]) [phone] ________________________________  Name: ALIXANDRIA FRIEDT MRN: [id number]  Date: 10/22/2013  DOB: 1926-12-30  Stereotactic Body Radiotherapy Treatment Procedure Note  NARRATIVE:  JILLIANN SUBRAMANIAN was brought to the stereotactic radiation treatment machine and placed supine on the CT couch. The patient was set up for stereotactic body radiotherapy on the body fix pillow.  3D TREATMENT PLANNING AND DOSIMETRY:  The patient's radiation plan was reviewed and approved prior to starting treatment.  It showed 3-dimensional radiation distributions overlaid onto the planning CT.  The [facility] for the target structures as well as the organs at risk were reviewed. The documentation of this is filed in the radiation oncology EMR.  SIMULATION VERIFICATION:  The patient underwent CT imaging on the treatment unit.  These were carefully aligned to document that the ablative radiation dose would cover the target volume and maximally spare the nearby organs at risk according to the planned distribution.  SPECIAL TREATMENT PROCEDURE: [name] received high dose ablative stereotactic body radiotherapy to the planned target volume without unforeseen complications. Treatment was delivered uneventfully. The high doses associated with stereotactic body radiotherapy and the significant potential risks require careful treatment set up and patient monitoring constituting a special treatment procedure   STEREOTACTIC TREATMENT MANAGEMENT:  Following delivery, the patient was evaluated clinically. The patient tolerated treatment without significant acute effects, and was discharged to home in stable condition.    PLAN: Continue treatment as planned.  ------------------------------------------------  Lurline Hare, MD

## 2013-12-01 ENCOUNTER — Encounter: Payer: Self-pay | Admitting: Cardiology

## 2013-12-02 ENCOUNTER — Ambulatory Visit (INDEPENDENT_AMBULATORY_CARE_PROVIDER_SITE_OTHER): Payer: Medicare Other | Admitting: Cardiology

## 2013-12-02 ENCOUNTER — Ambulatory Visit: Payer: Medicare Other | Admitting: Cardiology

## 2013-12-02 ENCOUNTER — Encounter (INDEPENDENT_AMBULATORY_CARE_PROVIDER_SITE_OTHER): Payer: Self-pay

## 2013-12-02 ENCOUNTER — Encounter: Payer: Self-pay | Admitting: Cardiology

## 2013-12-02 VITALS — BP 130/70 | HR 73 | Wt 151.0 lb

## 2013-12-02 DIAGNOSIS — I059 Rheumatic mitral valve disease, unspecified: Secondary | ICD-10-CM

## 2013-12-02 DIAGNOSIS — I519 Heart disease, unspecified: Secondary | ICD-10-CM

## 2013-12-02 DIAGNOSIS — I340 Nonrheumatic mitral (valve) insufficiency: Secondary | ICD-10-CM

## 2013-12-02 DIAGNOSIS — I2510 Atherosclerotic heart disease of native coronary artery without angina pectoris: Secondary | ICD-10-CM

## 2013-12-02 DIAGNOSIS — I4460 Unspecified fascicular block: Secondary | ICD-10-CM

## 2013-12-02 DIAGNOSIS — I10 Essential (primary) hypertension: Secondary | ICD-10-CM

## 2013-12-02 DIAGNOSIS — I444 Left anterior fascicular block: Secondary | ICD-10-CM

## 2013-12-02 MED ORDER — AMLODIPINE BESYLATE 10 MG PO TABS: 10.0000 mg | ORAL_TABLET | Freq: Every day | ORAL | Status: DC

## 2013-12-02 NOTE — Patient Instructions (Signed)
Your physician wants you to follow-up in: 4 months with DR. Skains You will receive a reminder letter in the mail two months in advance. If you don't receive a letter, please call our office to schedule the follow-up appointment.  Your physician recommends that you continue on your current medications as directed. Please refer to the Current Medication list given to you today.

## 2013-12-02 NOTE — Progress Notes (Signed)
[address], [address] [place], [place]  [address] Phone: [phone] Fax:  [phone]  Date:  12/02/2013   ID:  Joan Hunter, DOB 12-11-1927, MRN [id number]  PCP:  Katy Apo, MD   History of Present Illness: Joan Hunter is a 79 y.o. female with moderate coronary artery disease prior catheterization in 2009, prior idiopathic cardiomyopathy (EF was 15%) now resolved, mitral regurgitation mild here for followup. 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 Dr. Kathrynn Running. Radiation only.  Continuing with atorvastatin, hyperlipidemia. Mild shortness of breath with activity.   Wt Readings from Last 3 Encounters:  10/20/13 150 lb 6.4 oz (68.221 kg)  10/06/13 152 lb 12.8 oz (69.31 kg)  09/16/13 147 lb 4.8 oz (66.815 kg)     Past Medical History  Diagnosis Date  . CHF (congestive heart failure)   . Hyperlipidemia   . Hypertension   . Diabetes mellitus     dx 10 plus yrs ago  . Arthritis   . Dilated cardiomyopathy     now resolved, EF 60-65% echo 10/11/11 (Dr. Anne Fu)  . CAD (coronary artery disease)     mod 2V CAD (60% LAD, 70-80% mid CX) by 01/2008 cath  . Lung mass   . Allergic rhinitis   . Diverticulosis   . Osteopenia   . MR (mitral regurgitation)     Past Surgical History  Procedure Laterality Date  . Abdominal hysterectomy    . Appendectomy    . Knee arthroscopy      ?  left  knee  . Eye surgery      bil cataracts  . Tonsillectomy      Current Outpatient Prescriptions  Medication Sig Dispense Refill  . amLODipine (NORVASC) 10 MG tablet Take 10 mg by mouth daily.      Marland Kitchen aspirin 81 MG tablet Take 81 mg by mouth daily.      Marland Kitchen atorvastatin (LIPITOR) 40 MG tablet Take 40 mg by mouth daily.      . carvedilol (COREG) 25 MG tablet Take 25 mg by mouth 2 (two) times daily with a meal.      . furosemide (LASIX) 40 MG tablet Take 40 mg by mouth daily.      Marland Kitchen  gabapentin (NEURONTIN) 100 MG capsule Take 100 mg by mouth 3 (three) times daily. Pain      . glipiZIDE (GLUCOTROL) 5 MG tablet Take 2.5 mg by mouth daily.      . hydrALAZINE (APRESOLINE) 50 MG tablet Take 50 mg by mouth 3 (three) times daily.      Marland Kitchen lisinopril (PRINIVIL,ZESTRIL) 20 MG tablet Take 1 tablet (20 mg total) by mouth daily.  30 tablet  6  . Wound Cleansers (RADIAPLEX EX) Apply topically.       No current facility-administered medications for this visit.    Allergies:    Allergies  Allergen Reactions  . Moxifloxacin     REACTION: hives  . Penicillins     REACTION: hives  . Prednisone     REACTION: hives    Social History:  The patient  reports that she quit smoking about 41 years ago. Her smoking use included Cigarettes. She smoked 0.50 packs per day. She does not have any smokeless tobacco history on file. She reports that she does not drink alcohol or use illicit drugs.   ROS:  Please see the history of present illness.  Denies any syncope, bleeding, orthopnea, PND   PHYSICAL EXAM: VS:  There were no vitals taken for this visit. Well nourished, well developed, in no acute distress HEENT: normal Neck: no JVD Cardiac:  normal S1, S2; RRR; no murmur Lungs:  clear to auscultation bilaterally, no wheezing, rhonchi or rales Abd: soft, nontender, no hepatomegaly Ext: no edema Skin: warm and dry Neuro: no focal abnormalities noted  Creatinine 0.7 on 10/14, liver functions normal.  EKG:  12/02/13-will rhythm, sinus arrhythmia, left axis deviation/left anterior fascicular block, LVH, nonspecific ST changes, heart rate 73    ECHO 2012:   1. Normal LV size and function.  2. There were no regional wall motion abnormalities.  3. Left ventricular ejection fraction estimated by 2D at 60-65 percent.  4. Mild mitral valve regurgitation.  5. Trivial tricuspid regurgitation.  ASSESSMENT AND PLAN:  1. Left ventricular systolic dysfunction-now resolved, doing well.  Continue to encourage activity. 2. Mitral regurgitation-previously characterized as severe but now mild. Doing well. 3. Hypertension-currently well controlled 4. Left anterior fascicular block-chronic, unchanged 5. Lung cancer-currently being treated with radiation. Formally known as BAC 4 month f/u  Signed, Donato Schultz, MD [facility]  12/02/2013 11:33 AM

## 2013-12-17 ENCOUNTER — Ambulatory Visit (HOSPITAL_COMMUNITY)
Admission: RE | Admit: 2013-12-17 | Discharge: 2013-12-17 | Disposition: A | Discharge status: Home / Self Care | Payer: Medicare Other | Source: Ambulatory Visit | Attending: Internal Medicine | Admitting: Internal Medicine

## 2013-12-17 ENCOUNTER — Encounter (INDEPENDENT_AMBULATORY_CARE_PROVIDER_SITE_OTHER): Payer: Self-pay

## 2013-12-17 ENCOUNTER — Encounter (HOSPITAL_COMMUNITY): Payer: Self-pay

## 2013-12-17 ENCOUNTER — Other Ambulatory Visit (HOSPITAL_BASED_OUTPATIENT_CLINIC_OR_DEPARTMENT_OTHER): Payer: Medicare Other

## 2013-12-17 DIAGNOSIS — R222 Localized swelling, mass and lump, trunk: Secondary | ICD-10-CM | POA: Insufficient documentation

## 2013-12-17 DIAGNOSIS — I700 Atherosclerosis of aorta: Secondary | ICD-10-CM | POA: Insufficient documentation

## 2013-12-17 DIAGNOSIS — M899 Disorder of bone, unspecified: Secondary | ICD-10-CM | POA: Insufficient documentation

## 2013-12-17 DIAGNOSIS — D739 Disease of spleen, unspecified: Secondary | ICD-10-CM | POA: Insufficient documentation

## 2013-12-17 DIAGNOSIS — M949 Disorder of cartilage, unspecified: Secondary | ICD-10-CM

## 2013-12-17 DIAGNOSIS — I2510 Atherosclerotic heart disease of native coronary artery without angina pectoris: Secondary | ICD-10-CM | POA: Insufficient documentation

## 2013-12-17 DIAGNOSIS — R911 Solitary pulmonary nodule: Secondary | ICD-10-CM | POA: Insufficient documentation

## 2013-12-17 DIAGNOSIS — K449 Diaphragmatic hernia without obstruction or gangrene: Secondary | ICD-10-CM | POA: Insufficient documentation

## 2013-12-17 DIAGNOSIS — C3430 Malignant neoplasm of lower lobe, unspecified bronchus or lung: Secondary | ICD-10-CM

## 2013-12-17 DIAGNOSIS — I319 Disease of pericardium, unspecified: Secondary | ICD-10-CM | POA: Insufficient documentation

## 2013-12-17 DIAGNOSIS — R918 Other nonspecific abnormal finding of lung field: Secondary | ICD-10-CM | POA: Insufficient documentation

## 2013-12-17 DIAGNOSIS — C3490 Malignant neoplasm of unspecified part of unspecified bronchus or lung: Secondary | ICD-10-CM | POA: Insufficient documentation

## 2013-12-17 LAB — COMPREHENSIVE METABOLIC PANEL (CC13)
ALBUMIN: 3.6 g/dL (ref 3.5–5.0)
ALK PHOS: 71 U/L (ref 40–150)
ALT: 11 U/L (ref 0–55)
AST: 17 U/L (ref 5–34)
Anion Gap: 11 mEq/L (ref 3–11)
BUN: 19.5 mg/dL (ref 7.0–26.0)
CALCIUM: 9.1 mg/dL (ref 8.4–10.4)
CHLORIDE: 106 meq/L (ref 98–109)
CO2: 24 meq/L (ref 22–29)
Creatinine: 1 mg/dL (ref 0.6–1.1)
GLUCOSE: 153 mg/dL — AB (ref 70–140)
POTASSIUM: 4.4 meq/L (ref 3.5–5.1)
SODIUM: 142 meq/L (ref 136–145)
TOTAL PROTEIN: 7.4 g/dL (ref 6.4–8.3)
Total Bilirubin: 0.68 mg/dL (ref 0.20–1.20)

## 2013-12-17 LAB — CBC WITH DIFFERENTIAL/PLATELET
BASO%: 0.2 % (ref 0.0–2.0)
Basophils Absolute: 0 10*3/uL (ref 0.0–0.1)
EOS ABS: 0.1 10*3/uL (ref 0.0–0.5)
EOS%: 1.4 % (ref 0.0–7.0)
HCT: 37.6 % (ref 34.8–46.6)
HEMOGLOBIN: 12.2 g/dL (ref 11.6–15.9)
LYMPH#: 1.2 10*3/uL (ref 0.9–3.3)
LYMPH%: 21.1 % (ref 14.0–49.7)
MCH: 28.8 pg (ref 25.1–34.0)
MCHC: 32.4 g/dL (ref 31.5–36.0)
MCV: 88.7 fL (ref 79.5–101.0)
MONO#: 0.6 10*3/uL (ref 0.1–0.9)
MONO%: 10.8 % (ref 0.0–14.0)
NEUT%: 66.5 % (ref 38.4–76.8)
NEUTROS ABS: 3.8 10*3/uL (ref 1.5–6.5)
Platelets: 168 10*3/uL (ref 145–400)
RBC: 4.24 10*6/uL (ref 3.70–5.45)
RDW: 13.9 % (ref 11.2–14.5)
WBC: 5.7 10*3/uL (ref 3.9–10.3)

## 2013-12-17 MED ORDER — IOHEXOL 300 MG/ML  SOLN
80.0000 mL | Freq: Once | INTRAMUSCULAR | Status: AC | PRN
  Administered 2013-12-17: 80 mL via INTRAVENOUS

## 2013-12-20 ENCOUNTER — Encounter: Payer: Self-pay | Admitting: Internal Medicine

## 2013-12-20 ENCOUNTER — Ambulatory Visit (HOSPITAL_BASED_OUTPATIENT_CLINIC_OR_DEPARTMENT_OTHER): Payer: Medicare Other | Admitting: Internal Medicine

## 2013-12-20 ENCOUNTER — Telehealth: Payer: Self-pay | Admitting: Internal Medicine

## 2013-12-20 DIAGNOSIS — C3430 Malignant neoplasm of lower lobe, unspecified bronchus or lung: Secondary | ICD-10-CM

## 2013-12-20 NOTE — Patient Instructions (Signed)
Followup visit in 6 months with repeat CT scan of the chest.

## 2013-12-20 NOTE — Progress Notes (Signed)
[place] [phone]   [phone]  OFFICE PROGRESS NOTE  Kandice Hams, MD [address] [place] [address]  DIAGNOSIS: Stage IA (T1b, N0, M0) non-small cell lung cancer consistent with adenocarcinoma involving the right lower lobe, diagnosed in September 2014.  PRIOR THERAPY: Status post curative stereotactic radiotherapy to the right lower lobe lung nodule under the care of Dr. Tammi Klippel completed on 10/29/2013.  CURRENT THERAPY: Observation.  INTERVAL HISTORY: Joan Hunter 80 y.o. female returns to the clinic today for followup visit accompanied by her daughter. The patient tolerated the previous course of curative stereotactic radiotherapy to the right lower lobe lung nodule fairly well. She continues to have mild dry cough. The patient denied having any chest pain, shortness of breath or hemoptysis. She denied having any significant weight loss or night sweats. She had repeat CT scan of the chest performed recently and she is here for evaluation and discussion of her scan results.  MEDICAL HISTORY: Past Medical History  Diagnosis Date  . CHF (congestive heart failure)   . Hyperlipidemia   . Hypertension   . Diabetes mellitus     dx 10 plus yrs ago  . Arthritis   . Dilated cardiomyopathy     now resolved, EF 60-65% echo 10/11/11 (Dr. Marlou Porch)  . CAD (coronary artery disease)     mod 2V CAD (60% LAD, 70-80% mid CX) by 01/2008 cath  . Lung mass   . Allergic rhinitis   . Diverticulosis   . Osteopenia   . MR (mitral regurgitation)     ALLERGIES:  is allergic to moxifloxacin; penicillins; and prednisone.  MEDICATIONS:  Current Outpatient Prescriptions  Medication Sig Dispense Refill  . amLODipine (NORVASC) 10 MG tablet Take 1 tablet (10 mg total) by mouth daily.  30 tablet  3  . aspirin 81 MG tablet Take 81 mg by mouth daily.      Marland Kitchen atorvastatin (LIPITOR) 40 MG tablet Take 40 mg by mouth daily.      .  carvedilol (COREG) 25 MG tablet Take 25 mg by mouth 2 (two) times daily with a meal.      . furosemide (LASIX) 40 MG tablet Take 40 mg by mouth daily.      Marland Kitchen gabapentin (NEURONTIN) 100 MG capsule Take 100 mg by mouth 3 (three) times daily. Pain      . glipiZIDE (GLUCOTROL) 5 MG tablet Take 2.5 mg by mouth daily.      . hydrALAZINE (APRESOLINE) 50 MG tablet Take 50 mg by mouth 3 (three) times daily.      Marland Kitchen lisinopril (PRINIVIL,ZESTRIL) 20 MG tablet Take 1 tablet (20 mg total) by mouth daily.  30 tablet  6   No current facility-administered medications for this visit.    SURGICAL HISTORY:  Past Surgical History  Procedure Laterality Date  . Abdominal hysterectomy    . Appendectomy    . Knee arthroscopy      ?  left  knee  . Eye surgery      bil cataracts  . Tonsillectomy      REVIEW OF SYSTEMS:  A comprehensive review of systems was negative except for: Respiratory: positive for cough   PHYSICAL EXAMINATION: General appearance: alert, cooperative and no distress Head: Normocephalic, without obvious abnormality, atraumatic Neck: no adenopathy, no JVD, supple, symmetrical, trachea midline and thyroid not enlarged, symmetric, no tenderness/mass/nodules Lymph nodes: Cervical, supraclavicular, and axillary nodes normal. Resp: clear to auscultation  bilaterally Back: symmetric, no curvature. ROM normal. No CVA tenderness. Cardio: regular rate and rhythm, S1, S2 normal, no murmur, click, rub or gallop GI: soft, non-tender; bowel sounds normal; no masses,  no organomegaly Extremities: extremities normal, atraumatic, no cyanosis or edema  ECOG PERFORMANCE STATUS: 1 - Symptomatic but completely ambulatory  Blood pressure 132/58, pulse 71, temperature 97.9 F (36.6 C), temperature source Oral, resp. rate 18, height 5\' 7"  (1.702 m), weight 144 lb 12.8 oz (65.681 kg), SpO2 100.00%.  LABORATORY DATA: Lab Results  Component Value Date   WBC 5.7 12/17/2013   HGB 12.2 12/17/2013   HCT 37.6  12/17/2013   MCV 88.7 12/17/2013   PLT 168 12/17/2013      Chemistry      Component Value Date/Time   NA 142 12/17/2013 0854   NA 142 06/15/2012 0943   K 4.4 12/17/2013 0854   K 3.8 06/15/2012 0943   CL 104 06/15/2012 0943   CO2 24 12/17/2013 0854   CO2 25 06/15/2012 0943   BUN 19.5 12/17/2013 0854   BUN 17 08/05/2013 1500   CREATININE 1.0 12/17/2013 0854   CREATININE 0.91 08/05/2013 1457   CREATININE 0.80 06/15/2012 0943      Component Value Date/Time   CALCIUM 9.1 12/17/2013 0854   CALCIUM 9.3 06/15/2012 0943   ALKPHOS 71 12/17/2013 0854   ALKPHOS 81 06/15/2012 0943   AST 17 12/17/2013 0854   AST 20 06/15/2012 0943   ALT 11 12/17/2013 0854   ALT 11 06/15/2012 0943   BILITOT 0.68 12/17/2013 0854   BILITOT 0.5 06/15/2012 0943       RADIOGRAPHIC STUDIES: Ct Chest W Contrast  12/17/2013   CLINICAL DATA:  Lung cancer diagnosed in 2012. Right upper lobe. Restaging.  EXAM: CT CHEST WITH CONTRAST  TECHNIQUE: Multidetector CT imaging of the chest was performed during intravenous contrast administration.  CONTRAST:  17mL OMNIPAQUE IOHEXOL 300 MG/ML  SOLN  COMPARISON:  Plain film 09/01/2013. Most recent the chest CT of 08/25/2013.  FINDINGS: Lungs/Pleura: Right upper lobe interstitial thickening which is unchanged. Right upper lobe mixed soft tissue and ground-glass nodule is at the site of clustered smaller nodules on the prior exam. The solid component measures 8 mm today on image 27/series 5. This is cephalad and central to a mucous filled bronchus on image 29/series 5.  Solid right lower lobe subpleural nodule measures 1.1 x 1.0 cm and is similar (when remeasured). .  Scattered small bilateral nodules are also unchanged and likely post infectious or inflammatory. On the order of 3 mm or less.  A mixed solid and sub solid pulmonary nodule measures 2.8 x 2.2 cm on image 26/series 5 versus 2.7 x 2.6 cm at the same level on the prior. On sagittal image 37, 3.2 x 3.5 cm today versus 3.4 x 3.2 cm on the prior. This suggests stability.  4  mm left lower lobe nodule on image 27 is similar.  No pleural fluid.  Heart/Mediastinum: Aortic and branch vessel atherosclerosis. Normal heart size with trace pericardial fluid, slightly increased. This may be physiologic. Multivessel coronary artery atherosclerosis. No central pulmonary embolism, on this non-dedicated study. No mediastinal or hilar adenopathy. A moderate hiatal hernia.  Upper Abdomen: Subcentimeter low-density splenic lesion which is nonspecific and not significantly changed. Normal adrenal glands.  Bones/Musculoskeletal:  Moderate osteopenia.  IMPRESSION: 1. Similar size of a sub solid superior segment right lower lobe lung mass. 2. Development of a posterior right upper lobe mixed soft tissue and ground-glass  opacity nodule. This is at the site of a smaller clustered nodules on the prior exam and is surrounded by other clustered nodules. Favor post infectious or inflammatory etiology. Recommend attention on follow-up. 3. Other scattered pulmonary nodules which are small and favored to be post infectious or inflammatory. These are unchanged. 4. A moderate hiatal hernia. 5. Small pericardial effusion, slightly increased.   Electronically Signed   By: Abigail Miyamoto M.D.   On: 12/17/2013 11:33    ASSESSMENT AND PLAN: This is a very pleasant 80 years old [place] American female with history of stage IA non-small cell lung cancer involving the right lower lobe status post curative stereotactic radiotherapy completed in November 2014 and she is currently on observation. The recent CT scan of the chest showed no significant evidence for disease progression. I discussed the scan results and showed the images to the patient and her daughter. I recommended for her to continue on observation with repeat CT scan of the chest in 6 months. She was advised to call immediately if she has any concerning symptoms in the interval.  The patient voices understanding of current disease status and treatment  options and is in agreement with the current care plan.  All questions were answered. The patient knows to call the clinic with any problems, questions or concerns. We can certainly see the patient much sooner if necessary.

## 2013-12-20 NOTE — Telephone Encounter (Signed)
Gave pt appt for lab and Md and for june and July 2015

## 2013-12-22 ENCOUNTER — Inpatient Hospital Stay: Admission: RE | Admit: 2013-12-22 | Payer: Self-pay | Source: Ambulatory Visit | Admitting: Radiation Oncology

## 2013-12-22 ENCOUNTER — Telehealth: Payer: Self-pay | Admitting: Internal Medicine

## 2013-12-22 NOTE — Telephone Encounter (Signed)
RETURNED PT CALL AND S.W. PT CONFIRMING APPTS.

## 2013-12-23 ENCOUNTER — Encounter: Payer: Self-pay | Admitting: Radiation Oncology

## 2013-12-23 ENCOUNTER — Ambulatory Visit
Admission: RE | Admit: 2013-12-23 | Discharge: 2013-12-23 | Disposition: A | Discharge status: Home / Self Care | Payer: Medicare Other | Source: Ambulatory Visit | Attending: Radiation Oncology | Admitting: Radiation Oncology

## 2013-12-23 NOTE — Progress Notes (Signed)
Denies pain at this time. Daughter states, "she doesn't want to do anything anymore." Patient reports that she tires very easily. Reports frequency of cough has decreased. Reports when she does cough its dry. Denies dysphagia. Reports shortness of breath with exertion continues. Weight stable. Daughter reports she has to encourage her mother to eat. Reports she drinks two ensure on an average day. Denies nausea, vomiting, headache, dizziness, night sweats or diarrhea.

## 2013-12-23 NOTE — Progress Notes (Signed)
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  She has had some occasional dry cough, but not recently. No chest wall pain.                              ALLERGIES:  is allergic to moxifloxacin; penicillins; and prednisone.  Meds: Current Outpatient Prescriptions  Medication Sig Dispense Refill  . amLODipine (NORVASC) 10 MG tablet Take 1 tablet (10 mg total) by mouth daily.  30 tablet  3  . aspirin 81 MG tablet Take 81 mg by mouth daily.      Marland Kitchen atorvastatin (LIPITOR) 40 MG tablet Take 40 mg by mouth daily.      . carvedilol (COREG) 25 MG tablet Take 25 mg by mouth 2 (two) times daily with a meal.      . furosemide (LASIX) 40 MG tablet Take 40 mg by mouth daily.      Marland Kitchen gabapentin (NEURONTIN) 100 MG capsule Take 100 mg by mouth 3 (three) times daily. Pain      . glipiZIDE (GLUCOTROL) 5 MG tablet Take 2.5 mg by mouth daily.      . hydrALAZINE (APRESOLINE) 50 MG tablet Take 50 mg by mouth 3 (three) times daily.      Marland Kitchen lisinopril (PRINIVIL,ZESTRIL) 20 MG tablet Take 1 tablet (20 mg total) by mouth daily.  30 tablet  6   No current facility-administered medications for this encounter.    Physical Findings: The patient is in no acute distress. Patient is alert and oriented.  weight is 149 lb (67.586 kg). Her oral temperature is 98 F (36.7 C). Her blood pressure is 165/87 and her pulse is 92. Her respiration is 18 and oxygen saturation is 98%. .  No significant changes.  Lab  Findings: Lab Results  Component Value Date   WBC 5.7 12/17/2013   HGB 12.2 12/17/2013   HCT 37.6 12/17/2013   MCV 88.7 12/17/2013   PLT 168 12/17/2013    @LASTCHEM @  Radiographic Findings: Ct Chest W Contrast  12/17/2013   CLINICAL DATA:  Lung cancer diagnosed in 2012. Right upper lobe. Restaging.  EXAM: CT CHEST WITH CONTRAST  TECHNIQUE: Multidetector CT imaging of the chest was performed during intravenous contrast administration.  CONTRAST:  23mL OMNIPAQUE IOHEXOL 300 MG/ML  SOLN  COMPARISON:  Plain film 09/01/2013. Most recent the chest CT of 08/25/2013.  FINDINGS: Lungs/Pleura: Right upper lobe interstitial thickening which is unchanged. Right upper lobe mixed soft tissue and ground-glass nodule is at the site of clustered smaller nodules on the prior exam. The solid component measures 8 mm today on image 27/series 5. This is cephalad and central to a mucous filled bronchus on image 29/series 5.  Solid right lower lobe subpleural nodule measures 1.1 x 1.0 cm and is similar (when remeasured). .  Scattered small bilateral nodules are also unchanged and likely post infectious or inflammatory. On the order of 3  mm or less.  A mixed solid and sub solid pulmonary nodule measures 2.8 x 2.2 cm on image 26/series 5 versus 2.7 x 2.6 cm at the same level on the prior. On sagittal image 37, 3.2 x 3.5 cm today versus 3.4 x 3.2 cm on the prior. This suggests stability.  4 mm left lower lobe nodule on image 27 is similar.  No pleural fluid.  Heart/Mediastinum: Aortic and branch vessel atherosclerosis. Normal heart size with trace pericardial fluid, slightly increased. This may be physiologic. Multivessel coronary artery atherosclerosis. No central pulmonary embolism, on this non-dedicated study. No mediastinal or hilar adenopathy. A moderate hiatal hernia.  Upper Abdomen: Subcentimeter low-density splenic lesion which is nonspecific and not significantly changed. Normal adrenal glands.  Bones/Musculoskeletal:  Moderate  osteopenia.  IMPRESSION: 1. Similar size of a sub solid superior segment right lower lobe lung mass. 2. Development of a posterior right upper lobe mixed soft tissue and ground-glass opacity nodule. This is at the site of a smaller clustered nodules on the prior exam and is surrounded by other clustered nodules. Favor post infectious or inflammatory etiology. Recommend attention on follow-up. 3. Other scattered pulmonary nodules which are small and favored to be post infectious or inflammatory. These are unchanged. 4. A moderate hiatal hernia. 5. Small pericardial effusion, slightly increased.   Electronically Signed   By: Abigail Miyamoto M.D.   On: 12/17/2013 11:33    Impression:  The patient is recovering from the effects of radiation.  Her CT shows no significant change in the size of her subsolid tumor.  The density appears somewhat decreased, subjectively.  No new findings are noted.  Plan:  She is scheduled for chest CT in July, then follow-up 7/16.  _____________________________________  Sheral Apley. Tammi Klippel, M.D.

## 2014-01-27 ENCOUNTER — Telehealth: Payer: Self-pay | Admitting: Cardiology

## 2014-01-27 NOTE — Telephone Encounter (Signed)
Pt's daughter states that  pt is SOB for some time, but now her SOB is getting  Worse. Pt takes a few steps and she gets short of breath. Daughter thinks that pt needs oxygen. Pt denies LE edema. Pt takes lasix 40 mg once a day, but she has not taking this medication today, because when she takes the lasix she has to go to the restroom a lot, and she does not want to do that. Daughter said that pt ate hotdogs a day or two ago. Pt's daughter was made aware that pt needs to take her scheduled lasix dose  now and that she needs to avoid process food, because has too much salt. Daughter verbalized understanding she  is also aware that this message will be send to Dr. Marlou Porch for reviewing.

## 2014-01-27 NOTE — Telephone Encounter (Signed)
Please encourage her to take her Lasix. It is likely that she may be fluid overloaded based upon fluid intake and salt intake. For the next 2-3 days, I would encourage her to take Lasix 40 mg twice a day, then go back to 40 mg once a day thereafter. If symptoms do not resolve, we will see in clinic.

## 2014-01-27 NOTE — Telephone Encounter (Signed)
New problem   Pt is having SOB.

## 2014-01-28 NOTE — Telephone Encounter (Signed)
I spoke with daughter. She was able to get her mother to take the lasix yesterday afternoon & this am She is still short of breath. Urinating a little more. She will have her mother take the lasix as directed twice a day today, Saturday, & Sunday  Advised if symptoms worsen over weekend pt may need to then go to the ED.  Otherwise I have asked her to call our office on Monday with an update  Horton Chin RN

## 2014-02-02 NOTE — Telephone Encounter (Signed)
Agree with plan 

## 2014-02-11 ENCOUNTER — Other Ambulatory Visit: Payer: Self-pay

## 2014-02-11 MED ORDER — FUROSEMIDE 40 MG PO TABS
40.0000 mg | ORAL_TABLET | Freq: Every day | ORAL | Status: DC
Start: 2014-02-11 — End: 2014-03-31

## 2014-03-31 ENCOUNTER — Encounter: Payer: Self-pay | Admitting: Cardiology

## 2014-03-31 ENCOUNTER — Ambulatory Visit (INDEPENDENT_AMBULATORY_CARE_PROVIDER_SITE_OTHER): Payer: Medicare Other | Admitting: Cardiology

## 2014-03-31 VITALS — BP 160/80 | HR 82 | Ht 67.0 in | Wt 144.0 lb

## 2014-03-31 DIAGNOSIS — E7800 Pure hypercholesterolemia, unspecified: Secondary | ICD-10-CM

## 2014-03-31 DIAGNOSIS — I10 Essential (primary) hypertension: Secondary | ICD-10-CM

## 2014-03-31 DIAGNOSIS — I2510 Atherosclerotic heart disease of native coronary artery without angina pectoris: Secondary | ICD-10-CM

## 2014-03-31 NOTE — Progress Notes (Signed)
[place]. [address] [place], [place]  [address] Phone: [phone] Fax:  [phone]  Date:  03/31/2014   ID:  Joan Hunter, DOB June 24, 1927, MRN [id number]  PCP:  Kandice Hams, MD   History of Present Illness: Joan Hunter is a 80 y.o. female with moderate coronary artery disease prior catheterization in 2009, prior idiopathic cardiomyopathy (EF was 15%) now resolved, mitral regurgitation mild here for followup. 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 Dr. Tammi Klippel. Radiation only.  Continuing with atorvastatin, hyperlipidemia. Mild shortness of breath with activity.  She is trying to do more walking to improve her circulation. She's not having any chest discomfort.   She experienced some dizziness when standing and her medications were reduced.    Wt Readings from Last 3 Encounters:  03/31/14 144 lb (65.318 kg)  12/23/13 149 lb (67.586 kg)  12/20/13 144 lb 12.8 oz (65.681 kg)     Past Medical History  Diagnosis Date  . CHF (congestive heart failure)   . Hyperlipidemia   . Hypertension   . Diabetes mellitus     dx 10 plus yrs ago  . Arthritis   . Dilated cardiomyopathy     now resolved, EF 60-65% echo 10/11/11 (Dr. Marlou Porch)  . CAD (coronary artery disease)     mod 2V CAD (60% LAD, 70-80% mid CX) by 01/2008 cath  . Lung mass   . Allergic rhinitis   . Diverticulosis   . Osteopenia   . MR (mitral regurgitation)     Past Surgical History  Procedure Laterality Date  . Abdominal hysterectomy    . Appendectomy    . Knee arthroscopy      ?  left  knee  . Eye surgery      bil cataracts  . Tonsillectomy      Current Outpatient Prescriptions  Medication Sig Dispense Refill  . aspirin 81 MG tablet Take 81 mg by mouth daily.      Marland Kitchen atorvastatin (LIPITOR) 40 MG tablet Take 40 mg by mouth daily.      . carvedilol (COREG) 12.5 MG tablet Take 12.5 mg by mouth 2  (two) times daily with a meal.      . furosemide (LASIX) 20 MG tablet Take 1/2 tab every other day      . gabapentin (NEURONTIN) 100 MG capsule Take 100 mg by mouth 3 (three) times daily. Pain      . glipiZIDE (GLUCOTROL) 5 MG tablet Take 2.5 mg by mouth daily.      Marland Kitchen lisinopril (PRINIVIL,ZESTRIL) 20 MG tablet Take 1/2 tab daily       No current facility-administered medications for this visit.    Allergies:    Allergies  Allergen Reactions  . Moxifloxacin     REACTION: hives  . Penicillins     REACTION: hives  . Prednisone     REACTION: hives    Social History:  The patient  reports that she quit smoking about 41 years ago. Her smoking use included Cigarettes. She smoked 0.50 packs per day. She does not have any smokeless tobacco history on file. She reports that she does not drink alcohol or use illicit drugs.   ROS:  Please see the history of present illness.   Denies any syncope, bleeding, orthopnea, PND   PHYSICAL EXAM: VS:  BP 160/80  Pulse 82  Ht 5\' 7"  (1.702 m)  Wt 144 lb (65.318 kg)  BMI 22.55 kg/m2 Well nourished, well developed, in no acute distress HEENT: normal Neck: no JVD Cardiac:  normal S1, S2; RRR; no murmur Lungs:  clear to auscultation bilaterally, no wheezing, rhonchi or rales Abd: soft, nontender, no hepatomegaly Ext: no edema Skin: warm and dry Neuro: no focal abnormalities noted  Creatinine 0.7 on 10/14, liver functions normal.  EKG:  12/02/13-will rhythm, sinus arrhythmia, left axis deviation/left anterior fascicular block, LVH, nonspecific ST changes, heart rate 73    ECHO 2012:   1. Normal LV size and function.  2. There were no regional wall motion abnormalities.  3. Left ventricular ejection fraction estimated by 2D at 60-65 percent.  4. Mild mitral valve regurgitation.  5. Trivial tricuspid regurgitation.  ASSESSMENT AND PLAN:  1. Left ventricular systolic dysfunction-now resolved, doing well. Continue to encourage  activity. 2. Mitral regurgitation-previously characterized as severe but now mild. Doing well. 3. Hypertension-currently reasonable controlled. Off of hydralazine and amlodipine. Decreased coreg because of dizziness. Symptoms of orthostatic hypotension. She is doing better with decrease in medication.  4. Edema - QOD lasix 5. Left anterior fascicular block-chronic, unchanged 6. Lung cancer-currently being treated with radiation. Formally known as BAC 4 month f/u  Signed, Candee Furbish, MD [facility]  03/31/2014 12:09 PM

## 2014-03-31 NOTE — Patient Instructions (Signed)
Your physician recommends that you continue on your current medications as directed. Please refer to the Current Medication list given to you today. Your physician wants you to follow-up in: 4 months You will receive a reminder letter in the mail two months in advance. If you don't receive a letter, please call our office to schedule the follow-up appointment.  

## 2014-04-20 ENCOUNTER — Other Ambulatory Visit: Payer: Self-pay | Admitting: Internal Medicine

## 2014-04-20 DIAGNOSIS — R42 Dizziness and giddiness: Secondary | ICD-10-CM

## 2014-04-20 DIAGNOSIS — R51 Headache: Principal | ICD-10-CM

## 2014-04-20 DIAGNOSIS — R519 Headache, unspecified: Secondary | ICD-10-CM

## 2014-04-22 ENCOUNTER — Ambulatory Visit
Admission: RE | Admit: 2014-04-22 | Discharge: 2014-04-22 | Disposition: A | Discharge status: Home / Self Care | Payer: Medicare Other | Source: Ambulatory Visit | Attending: Internal Medicine | Admitting: Internal Medicine

## 2014-04-22 DIAGNOSIS — R519 Headache, unspecified: Secondary | ICD-10-CM

## 2014-04-22 DIAGNOSIS — R42 Dizziness and giddiness: Secondary | ICD-10-CM

## 2014-04-22 DIAGNOSIS — R51 Headache: Principal | ICD-10-CM

## 2014-05-23 ENCOUNTER — Telehealth: Payer: Self-pay | Admitting: Internal Medicine

## 2014-05-23 NOTE — Telephone Encounter (Signed)
returned pt call and lvm for pt to call back with question/concern

## 2014-05-24 ENCOUNTER — Telehealth: Payer: Self-pay | Admitting: Internal Medicine

## 2014-05-24 NOTE — Telephone Encounter (Signed)
s.w.l pt daughter and advised on June adn July appt...ok adn aware

## 2014-06-07 ENCOUNTER — Encounter (HOSPITAL_COMMUNITY): Payer: Self-pay

## 2014-06-08 ENCOUNTER — Other Ambulatory Visit: Payer: Self-pay | Admitting: *Deleted

## 2014-06-08 DIAGNOSIS — C3410 Malignant neoplasm of upper lobe, unspecified bronchus or lung: Secondary | ICD-10-CM

## 2014-06-09 ENCOUNTER — Other Ambulatory Visit (HOSPITAL_BASED_OUTPATIENT_CLINIC_OR_DEPARTMENT_OTHER): Payer: Medicare Other

## 2014-06-09 DIAGNOSIS — I10 Essential (primary) hypertension: Secondary | ICD-10-CM

## 2014-06-09 DIAGNOSIS — C3430 Malignant neoplasm of lower lobe, unspecified bronchus or lung: Secondary | ICD-10-CM

## 2014-06-09 DIAGNOSIS — C3410 Malignant neoplasm of upper lobe, unspecified bronchus or lung: Secondary | ICD-10-CM

## 2014-06-09 LAB — COMPREHENSIVE METABOLIC PANEL (CC13)
ALBUMIN: 3.6 g/dL (ref 3.5–5.0)
ALT: 9 U/L (ref 0–55)
ANION GAP: 10 meq/L (ref 3–11)
AST: 16 U/L (ref 5–34)
Alkaline Phosphatase: 79 U/L (ref 40–150)
BUN: 17 mg/dL (ref 7.0–26.0)
CALCIUM: 9.3 mg/dL (ref 8.4–10.4)
CHLORIDE: 107 meq/L (ref 98–109)
CO2: 27 mEq/L (ref 22–29)
Creatinine: 0.9 mg/dL (ref 0.6–1.1)
GLUCOSE: 141 mg/dL — AB (ref 70–140)
POTASSIUM: 3.8 meq/L (ref 3.5–5.1)
Sodium: 144 mEq/L (ref 136–145)
Total Bilirubin: 0.67 mg/dL (ref 0.20–1.20)
Total Protein: 7.5 g/dL (ref 6.4–8.3)

## 2014-06-09 LAB — CBC WITH DIFFERENTIAL/PLATELET
BASO%: 0.3 % (ref 0.0–2.0)
BASOS ABS: 0 10*3/uL (ref 0.0–0.1)
EOS%: 2.1 % (ref 0.0–7.0)
Eosinophils Absolute: 0.1 10*3/uL (ref 0.0–0.5)
HEMATOCRIT: 41.2 % (ref 34.8–46.6)
HEMOGLOBIN: 13.5 g/dL (ref 11.6–15.9)
LYMPH#: 1.6 10*3/uL (ref 0.9–3.3)
LYMPH%: 24.2 % (ref 14.0–49.7)
MCH: 29 pg (ref 25.1–34.0)
MCHC: 32.8 g/dL (ref 31.5–36.0)
MCV: 88.6 fL (ref 79.5–101.0)
MONO#: 0.7 10*3/uL (ref 0.1–0.9)
MONO%: 9.8 % (ref 0.0–14.0)
NEUT#: 4.2 10*3/uL (ref 1.5–6.5)
NEUT%: 63.6 % (ref 38.4–76.8)
Platelets: 179 10*3/uL (ref 145–400)
RBC: 4.65 10*6/uL (ref 3.70–5.45)
RDW: 14.6 % — ABNORMAL HIGH (ref 11.2–14.5)
WBC: 6.6 10*3/uL (ref 3.9–10.3)
nRBC: 0 % (ref 0–0)

## 2014-06-16 ENCOUNTER — Ambulatory Visit (HOSPITAL_COMMUNITY)
Admission: RE | Admit: 2014-06-16 | Discharge: 2014-06-16 | Disposition: A | Discharge status: Home / Self Care | Payer: Medicare Other | Source: Ambulatory Visit | Attending: Internal Medicine | Admitting: Internal Medicine

## 2014-06-16 ENCOUNTER — Encounter (HOSPITAL_COMMUNITY): Payer: Self-pay

## 2014-06-16 DIAGNOSIS — Z90710 Acquired absence of both cervix and uterus: Secondary | ICD-10-CM | POA: Insufficient documentation

## 2014-06-16 DIAGNOSIS — C3430 Malignant neoplasm of lower lobe, unspecified bronchus or lung: Secondary | ICD-10-CM | POA: Diagnosis present

## 2014-06-16 DIAGNOSIS — M4000 Postural kyphosis, site unspecified: Secondary | ICD-10-CM | POA: Insufficient documentation

## 2014-06-16 DIAGNOSIS — I517 Cardiomegaly: Secondary | ICD-10-CM | POA: Diagnosis not present

## 2014-06-16 DIAGNOSIS — Z923 Personal history of irradiation: Secondary | ICD-10-CM | POA: Insufficient documentation

## 2014-06-16 DIAGNOSIS — J701 Chronic and other pulmonary manifestations due to radiation: Secondary | ICD-10-CM | POA: Insufficient documentation

## 2014-06-16 DIAGNOSIS — R091 Pleurisy: Secondary | ICD-10-CM | POA: Insufficient documentation

## 2014-06-16 DIAGNOSIS — I319 Disease of pericardium, unspecified: Secondary | ICD-10-CM | POA: Insufficient documentation

## 2014-06-16 DIAGNOSIS — T66XXXS Radiation sickness, unspecified, sequela: Secondary | ICD-10-CM | POA: Insufficient documentation

## 2014-06-16 DIAGNOSIS — K449 Diaphragmatic hernia without obstruction or gangrene: Secondary | ICD-10-CM | POA: Insufficient documentation

## 2014-06-16 DIAGNOSIS — K8689 Other specified diseases of pancreas: Secondary | ICD-10-CM | POA: Insufficient documentation

## 2014-06-16 DIAGNOSIS — Y842 Radiological procedure and radiotherapy as the cause of abnormal reaction of the patient, or of later complication, without mention of misadventure at the time of the procedure: Secondary | ICD-10-CM | POA: Insufficient documentation

## 2014-06-16 DIAGNOSIS — D1809 Hemangioma of other sites: Secondary | ICD-10-CM | POA: Insufficient documentation

## 2014-06-16 DIAGNOSIS — I2510 Atherosclerotic heart disease of native coronary artery without angina pectoris: Secondary | ICD-10-CM | POA: Diagnosis not present

## 2014-06-16 MED ORDER — IOHEXOL 300 MG/ML  SOLN
80.0000 mL | Freq: Once | INTRAMUSCULAR | Status: AC | PRN
  Administered 2014-06-16: 80 mL via INTRAVENOUS

## 2014-06-18 ENCOUNTER — Other Ambulatory Visit: Payer: Self-pay | Admitting: Cardiology

## 2014-06-20 ENCOUNTER — Ambulatory Visit (HOSPITAL_BASED_OUTPATIENT_CLINIC_OR_DEPARTMENT_OTHER): Payer: Medicare Other | Admitting: Internal Medicine

## 2014-06-20 ENCOUNTER — Telehealth: Payer: Self-pay | Admitting: Internal Medicine

## 2014-06-20 ENCOUNTER — Encounter: Payer: Self-pay | Admitting: Internal Medicine

## 2014-06-20 VITALS — BP 173/93 | HR 76 | Temp 98.3°F | Resp 18 | Ht 67.0 in | Wt 143.6 lb

## 2014-06-20 DIAGNOSIS — R059 Cough, unspecified: Secondary | ICD-10-CM

## 2014-06-20 DIAGNOSIS — R05 Cough: Secondary | ICD-10-CM

## 2014-06-20 DIAGNOSIS — C3430 Malignant neoplasm of lower lobe, unspecified bronchus or lung: Secondary | ICD-10-CM

## 2014-06-20 DIAGNOSIS — C3410 Malignant neoplasm of upper lobe, unspecified bronchus or lung: Secondary | ICD-10-CM

## 2014-06-20 NOTE — Progress Notes (Signed)
Joan Hunter [phone]   [phone]  OFFICE PROGRESS NOTE  Joan Hams, MD [address] [place] [address]  DIAGNOSIS: Stage IA (T1b, N0, M0) non-small cell lung cancer consistent with adenocarcinoma involving the right lower lobe, diagnosed in September 2014.  PRIOR THERAPY: Status post curative stereotactic radiotherapy to the right lower lobe lung nodule under the care of Dr. Tammi Klippel completed on 10/29/2013.  CURRENT THERAPY: Observation.  INTERVAL HISTORY: Joan Hunter 80 y.o. female returns to the clinic today for six-month followup visit accompanied by her daughter. She continues to have mild dry cough. The patient denied having any chest pain, shortness of breath or hemoptysis. She has no fever or chills, no nausea or vomiting. She denied having any significant weight loss or night sweats. She had repeat CT scan of the chest performed recently and she is here for evaluation and discussion of her scan results.  MEDICAL HISTORY: Past Medical History  Diagnosis Date  . CHF (congestive heart failure)   . Hyperlipidemia   . Hypertension   . Diabetes mellitus     dx 10 plus yrs ago  . Arthritis   . Dilated cardiomyopathy     now resolved, EF 60-65% echo 10/11/11 (Dr. Marlou Porch)  . CAD (coronary artery disease)     mod 2V CAD (60% LAD, 70-80% mid CX) by 01/2008 cath  . Lung mass   . Allergic rhinitis   . Diverticulosis   . Osteopenia   . MR (mitral regurgitation)     ALLERGIES:  is allergic to moxifloxacin; penicillins; and prednisone.  MEDICATIONS:  Current Outpatient Prescriptions  Medication Sig Dispense Refill  . aspirin 81 MG tablet Take 81 mg by mouth daily.      Marland Kitchen atorvastatin (LIPITOR) 40 MG tablet Take 40 mg by mouth daily.      . carvedilol (COREG) 12.5 MG tablet Take 12.5 mg by mouth 2 (two) times daily with a meal.      . furosemide (LASIX) 20 MG tablet Take 1/2 tab every other day      .  gabapentin (NEURONTIN) 100 MG capsule Take 100 mg by mouth 3 (three) times daily. Pain      . glipiZIDE (GLUCOTROL) 5 MG tablet Take 2.5 mg by mouth daily.      Marland Kitchen lisinopril (PRINIVIL,ZESTRIL) 20 MG tablet Take 1/2 tab daily       No current facility-administered medications for this visit.    SURGICAL HISTORY:  Past Surgical History  Procedure Laterality Date  . Abdominal hysterectomy    . Appendectomy    . Knee arthroscopy      ?  left  knee  . Eye surgery      bil cataracts  . Tonsillectomy      REVIEW OF SYSTEMS:  A comprehensive review of systems was negative except for: Respiratory: positive for cough   PHYSICAL EXAMINATION: General appearance: alert, cooperative and no distress Head: Normocephalic, without obvious abnormality, atraumatic Neck: no adenopathy, no JVD, supple, symmetrical, trachea midline and thyroid not enlarged, symmetric, no tenderness/mass/nodules Lymph nodes: Cervical, supraclavicular, and axillary nodes normal. Resp: clear to auscultation bilaterally Back: symmetric, no curvature. ROM normal. No CVA tenderness. Cardio: regular rate and rhythm, S1, S2 normal, no murmur, click, rub or gallop GI: soft, non-tender; bowel sounds normal; no masses,  no organomegaly Extremities: extremities normal, atraumatic, no cyanosis or edema  ECOG PERFORMANCE STATUS: 1 - Symptomatic but completely ambulatory  Blood  pressure 173/93, pulse 76, temperature 98.3 F (36.8 C), temperature source Oral, resp. rate 18, height 5\' 7"  (1.702 m), weight 143 lb 9.6 oz (65.137 kg), SpO2 98.00%.  LABORATORY DATA: Lab Results  Component Value Date   WBC 6.6 06/09/2014   HGB 13.5 06/09/2014   HCT 41.2 06/09/2014   MCV 88.6 06/09/2014   PLT 179 06/09/2014      Chemistry      Component Value Date/Time   NA 144 06/09/2014 0803   NA 142 06/15/2012 0943   K 3.8 06/09/2014 0803   K 3.8 06/15/2012 0943   CL 104 06/15/2012 0943   CO2 27 06/09/2014 0803   CO2 25 06/15/2012 0943   BUN 17.0  06/09/2014 0803   BUN 17 08/05/2013 1500   CREATININE 0.9 06/09/2014 0803   CREATININE 0.91 08/05/2013 1457   CREATININE 0.80 06/15/2012 0943      Component Value Date/Time   CALCIUM 9.3 06/09/2014 0803   CALCIUM 9.3 06/15/2012 0943   ALKPHOS 79 06/09/2014 0803   ALKPHOS 81 06/15/2012 0943   AST 16 06/09/2014 0803   AST 20 06/15/2012 0943   ALT 9 06/09/2014 0803   ALT 11 06/15/2012 0943   BILITOT 0.67 06/09/2014 0803   BILITOT 0.5 06/15/2012 0943       RADIOGRAPHIC STUDIES: Ct Chest W Contrast  06/16/2014   CLINICAL DATA:  Followup of right lower lobe lung cancer (low-grade adenocarcinoma) diagnosed 7/13. Radiation therapy completed in 2014. Hysterectomy.  EXAM: CT CHEST WITH CONTRAST  TECHNIQUE: Multidetector CT imaging of the chest was performed during intravenous contrast administration.  CONTRAST:  55mL OMNIPAQUE IOHEXOL 300 MG/ML  SOLN  COMPARISON:  12/17/2013  FINDINGS: Lungs/Pleura: Mild nodularity along the right minor fissure, including on image 27. This is improved and likely post infectious or inflammatory. Nodularity in the right middle lobe is also slightly improved.  A right lower lobe lung nodule measures 1.1 x 1.0 cm on image 47 and is not significantly changed. 9 mm craniocaudal on coronal image 56 com also similar. This has been present back to 06/17/2012, most consistent with a benign etiology.  Radiation fibrosis is identified about the perihilar right upper lobe and superior segment right lower lobe. The latter area of fibrosis is at the site of the previously described sub solid pulmonary nodule. No well-defined residual nodule is apparent.  3 mm right lower lobe lung nodule on image 32 is not significantly changed. 4 mm superior segment left lower lobe lung nodule is also similar.  Trace right-sided pleural thickening or fluid, new.  Heart/Mediastinum: No supraclavicular adenopathy. Tortuous descending thoracic aorta. Mild cardiomegaly with coronary artery atherosclerosis. Small pericardial  effusion is unchanged. No central pulmonary embolism, on this non-dedicated study. No mediastinal or hilar adenopathy. A moderate hiatal hernia.  Upper Abdomen: Focal steatosis adjacent the falciform ligament. Normal imaged portions of the spleen, adrenal glands. Too small to characterize lesions in both kidneys. Prominence of the common duct is similar at up to 12 mm in the porta hepatis on image 59. Mild pancreatic atrophy with borderline ductal dilatation, also similar.  Bones/Musculoskeletal: Mid thoracic vertebral hemangioma. Accentuation of expected thoracic kyphosis  IMPRESSION: 1. Development of radiation changes within the right upper lobe and superior segment right lower lobe. The previously described sub solid superior segment right lower lobe lung nodule is no longer identified. 2. Improved appearance of right upper and right middle lobe nodularity. The right upper lobe sub solid nodule described on the prior exam has resolved.  3. Similar appearance of other scattered pulmonary nodules which are likely post infectious/inflammatory. 4. Similar pericardial effusion, small. 5. Moderate hiatal hernia. 6. Mild common duct dilatation for age, incompletely imaged but not significantly changed. Recommend attention on follow-up. 7. Right lower lobe nodule which has been similar back 2 years, consistent with a benign etiology.   Electronically Signed   By: Abigail Miyamoto M.D.   On: 06/16/2014 08:47   ASSESSMENT AND PLAN: This is a very pleasant 80 years old [place] American female with history of stage IA non-small cell lung cancer involving the right lower lobe status post curative stereotactic radiotherapy completed in November 2014 and she is currently on observation. The recent CT scan of the chest showed no significant evidence for disease progression.  I discussed the scan results and showed the images to the patient and her daughter. I recommended for her to continue on observation with repeat CT scan of  the chest in 6 months. She was advised to call immediately if she has any concerning symptoms in the interval.  The patient voices understanding of current disease status and treatment options and is in agreement with the current care plan.  All questions were answered. The patient knows to call the clinic with any problems, questions or concerns. We can certainly see the patient much sooner if necessary.  Disclaimer: This note was dictated with voice recognition software. Similar sounding words can inadvertently be transcribed and may not be corrected upon review.

## 2014-06-20 NOTE — Telephone Encounter (Signed)
gv and rpinted appt sched and avs for pt for Jan

## 2014-06-29 ENCOUNTER — Encounter: Payer: Self-pay | Admitting: Radiation Oncology

## 2014-06-29 NOTE — Progress Notes (Signed)
Radiation Oncology         [phone] ________________________________  Name: Joan Hunter MRN: [id number]  Date: 06/30/2014  DOB: [birth year]  Follow-Up Visit Note  CC: Kandice Hams, MD  Kandice Hams, MD  Diagnosis:   80 year old woman with a T1b (2.7 cm) N0 M0 subsolid adenocarcinoma carcinoma in situ (formerly called bronchoalveolar carcinoma-BAC) of the right lower lung- clinical stage IA s/p SBRT through 10/29/2013 to 60 Gy in 5 fractions   Interval Since Last Radiation:  8  months  Narrative:  The patient returns today for routine follow-up.  Reports an occasional dry cough. Reports occasional shortness of breath with exertion. Denies difficulty swallowing. Denies chest pain or night sweats Denies nausea, vomiting, headache, dizziness, or diarrhea. Six pound weight loss noted since January despite an "excellent appetite." Blood pressure elevated. Patient reports she hasn't taken her BP medication yet today but, plans to take it with breakfast after she leaves this appointment. Will follow up with [facility] in six months. CT of chest schedule again in January                               ALLERGIES:  is allergic to moxifloxacin; penicillins; and prednisone.  Meds: Current Outpatient Prescriptions  Medication Sig Dispense Refill  . aspirin 81 MG tablet Take 81 mg by mouth daily.      Marland Kitchen atorvastatin (LIPITOR) 40 MG tablet Take 40 mg by mouth daily.      . carvedilol (COREG) 12.5 MG tablet Take 12.5 mg by mouth 2 (two) times daily with a meal.      . furosemide (LASIX) 20 MG tablet Take 1/2 tab every other day      . gabapentin (NEURONTIN) 100 MG capsule Take 100 mg by mouth 3 (three) times daily. Pain      . glipiZIDE (GLUCOTROL) 5 MG tablet Take 2.5 mg by mouth daily.      Marland Kitchen lisinopril (PRINIVIL,ZESTRIL) 20 MG tablet Take 1/2 tab daily      . lisinopril (PRINIVIL,ZESTRIL) 20 MG tablet TAKE ONE TABLET BY MOUTH ONCE DAILY  30 tablet  5  . Naproxen Sodium (ALEVE) 220 MG  CAPS Take 1 capsule by mouth daily as needed.       No current facility-administered medications for this encounter.    Physical Findings: The patient is in no acute distress. Patient is alert and oriented.  vitals were not taken for this visit..  No significant changes.  Lab Findings: Lab Results  Component Value Date   WBC 6.6 06/09/2014   HGB 13.5 06/09/2014   HCT 41.2 06/09/2014   MCV 88.6 06/09/2014   PLT 179 06/09/2014    @LASTCHEM @  Radiographic Findings: Ct Chest W Contrast  06/16/2014   CLINICAL DATA:  Followup of right lower lobe lung cancer (low-grade adenocarcinoma) diagnosed 7/13. Radiation therapy completed in 2014. Hysterectomy.  EXAM: CT CHEST WITH CONTRAST  TECHNIQUE: Multidetector CT imaging of the chest was performed during intravenous contrast administration.  CONTRAST:  32mL OMNIPAQUE IOHEXOL 300 MG/ML  SOLN  COMPARISON:  12/17/2013  FINDINGS: Lungs/Pleura: Mild nodularity along the right minor fissure, including on image 27. This is improved and likely post infectious or inflammatory. Nodularity in the right middle lobe is also slightly improved.  A right lower lobe lung nodule measures 1.1 x 1.0 cm on image 47 and is not significantly changed. 9 mm craniocaudal on coronal image 56 com also similar. This  has been present back to 06/17/2012, most consistent with a benign etiology.  Radiation fibrosis is identified about the perihilar right upper lobe and superior segment right lower lobe. The latter area of fibrosis is at the site of the previously described sub solid pulmonary nodule. No well-defined residual nodule is apparent.  3 mm right lower lobe lung nodule on image 32 is not significantly changed. 4 mm superior segment left lower lobe lung nodule is also similar.  Trace right-sided pleural thickening or fluid, new.  Heart/Mediastinum: No supraclavicular adenopathy. Tortuous descending thoracic aorta. Mild cardiomegaly with coronary artery atherosclerosis. Small  pericardial effusion is unchanged. No central pulmonary embolism, on this non-dedicated study. No mediastinal or hilar adenopathy. A moderate hiatal hernia.  Upper Abdomen: Focal steatosis adjacent the falciform ligament. Normal imaged portions of the spleen, adrenal glands. Too small to characterize lesions in both kidneys. Prominence of the common duct is similar at up to 12 mm in the porta hepatis on image 59. Mild pancreatic atrophy with borderline ductal dilatation, also similar.  Bones/Musculoskeletal: Mid thoracic vertebral hemangioma. Accentuation of expected thoracic kyphosis  IMPRESSION: 1. Development of radiation changes within the right upper lobe and superior segment right lower lobe. The previously described sub solid superior segment right lower lobe lung nodule is no longer identified. 2. Improved appearance of right upper and right middle lobe nodularity. The right upper lobe sub solid nodule described on the prior exam has resolved. 3. Similar appearance of other scattered pulmonary nodules which are likely post infectious/inflammatory. 4. Similar pericardial effusion, small. 5. Moderate hiatal hernia. 6. Mild common duct dilatation for age, incompletely imaged but not significantly changed. Recommend attention on follow-up. 7. Right lower lobe nodule which has been similar back 2 years, consistent with a benign etiology.   Electronically Signed   By: Abigail Miyamoto M.D.   On: 06/16/2014 08:47    Impression:  The patient is stable with no evidence of disease following radiation.  No evidence of recurrence  Plan:  Chest CT in 6 months then follow-up.  _____________________________________  Sheral Apley. Tammi Klippel, M.D.

## 2014-06-30 ENCOUNTER — Encounter: Payer: Self-pay | Admitting: Radiation Oncology

## 2014-06-30 ENCOUNTER — Ambulatory Visit
Admission: RE | Admit: 2014-06-30 | Discharge: 2014-06-30 | Disposition: A | Discharge status: Home / Self Care | Payer: Medicare Other | Source: Ambulatory Visit | Attending: Radiation Oncology | Admitting: Radiation Oncology

## 2014-06-30 VITALS — BP 179/102 | HR 91 | Resp 18 | Wt 143.3 lb

## 2014-06-30 DIAGNOSIS — C3410 Malignant neoplasm of upper lobe, unspecified bronchus or lung: Secondary | ICD-10-CM

## 2014-06-30 NOTE — Progress Notes (Signed)
Reports an occasional dry cough. Reports occasional shortness of breath with exertion. Denies difficulty swallowing. Denies chest pain or night sweats  Denies nausea, vomiting, headache, dizziness, or diarrhea. Six pound weight loss noted since January despite an "excellent appetite." Blood pressure elevated. Patient reports she hasn't taken her BP medication yet today but, plans to take it with breakfast after she leaves this appointment. Will follow up with [facility] in six months. CT of chest schedule again in January.

## 2014-10-12 ENCOUNTER — Telehealth: Payer: Self-pay

## 2014-10-12 ENCOUNTER — Other Ambulatory Visit: Payer: Self-pay

## 2014-10-12 MED ORDER — CARVEDILOL 12.5 MG PO TABS: 12.5000 mg | ORAL_TABLET | Freq: Two times a day (BID) | ORAL | Status: DC

## 2014-10-12 NOTE — Telephone Encounter (Signed)
error 

## 2014-11-18 ENCOUNTER — Encounter: Payer: Self-pay | Admitting: Cardiology

## 2014-11-18 ENCOUNTER — Ambulatory Visit (INDEPENDENT_AMBULATORY_CARE_PROVIDER_SITE_OTHER): Payer: Medicare Other | Admitting: Cardiology

## 2014-11-18 VITALS — BP 140/88 | HR 96 | Ht 67.0 in | Wt 149.5 lb

## 2014-11-18 DIAGNOSIS — I10 Essential (primary) hypertension: Secondary | ICD-10-CM

## 2014-11-18 DIAGNOSIS — I340 Nonrheumatic mitral (valve) insufficiency: Secondary | ICD-10-CM | POA: Insufficient documentation

## 2014-11-18 DIAGNOSIS — I444 Left anterior fascicular block: Secondary | ICD-10-CM | POA: Insufficient documentation

## 2014-11-18 MED ORDER — LOSARTAN POTASSIUM 25 MG PO TABS: 25.0000 mg | ORAL_TABLET | Freq: Every day | ORAL | Status: DC

## 2014-11-18 NOTE — Progress Notes (Signed)
[facility]. [address] [place], [place]  [address] Phone: [phone] Fax:  [phone]  Date:  11/18/2014   ID:  Joan Hunter, DOB [birth year], MRN [id number]  PCP:  Kandice Hams, MD   History of Present Illness: Joan Hunter is a 80 y.o. female with moderate coronary artery disease prior catheterization in 2009, prior idiopathic cardiomyopathy (EF was 15%) now resolved, mitral regurgitation mild here for followup. 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 Dr. Tammi Klippel. Radiation only. Seeing again in 1/16.  Continuing with atorvastatin, hyperlipidemia. Mild shortness of breath with activity.  She is trying to do more walking to improve her circulation. She's not having any chest discomfort.   She experienced some dizziness when standing and her medications were reduced.   Her daughter helps her out significantly but she also make sure that she gets up and moves around adequately.    Wt Readings from Last 3 Encounters:  06/30/14 143 lb 4.8 oz (65 kg)  06/20/14 143 lb 9.6 oz (65.137 kg)  03/31/14 144 lb (65.318 kg)     Past Medical History  Diagnosis Date  . CHF (congestive heart failure)   . Hyperlipidemia   . Hypertension   . Diabetes mellitus     dx 10 plus yrs ago  . Arthritis   . Dilated cardiomyopathy     now resolved, EF 60-65% echo 10/11/11 (Dr. Marlou Porch)  . CAD (coronary artery disease)     mod 2V CAD (60% LAD, 70-80% mid CX) by 01/2008 cath  . Lung mass   . Allergic rhinitis   . Diverticulosis   . Osteopenia   . MR (mitral regurgitation)     Past Surgical History  Procedure Laterality Date  . Abdominal hysterectomy    . Appendectomy    . Knee arthroscopy      ?  left  knee  . Eye surgery      bil cataracts  . Tonsillectomy      Current Outpatient Prescriptions  Medication Sig Dispense Refill  . aspirin 81 MG tablet Take 81 mg by mouth daily.     Marland Kitchen atorvastatin (LIPITOR) 40 MG tablet Take 40 mg by mouth daily.    . carvedilol (COREG) 12.5 MG tablet Take 1 tablet (12.5 mg total) by mouth 2 (two) times daily with a meal. 60 tablet 2  . furosemide (LASIX) 20 MG tablet Take 1/2 tab every other day    . gabapentin (NEURONTIN) 100 MG capsule Take 100 mg by mouth 3 (three) times daily. Pain    . glipiZIDE (GLUCOTROL) 5 MG tablet Take 2.5 mg by mouth daily.    Marland Kitchen lisinopril (PRINIVIL,ZESTRIL) 20 MG tablet Take 1/2 tab daily    . lisinopril (PRINIVIL,ZESTRIL) 20 MG tablet TAKE ONE TABLET BY MOUTH ONCE DAILY 30 tablet 5  . Naproxen Sodium (ALEVE) 220 MG CAPS Take 1 capsule by mouth daily as needed.     No current facility-administered medications for this visit.    Allergies:    Allergies  Allergen Reactions  . Moxifloxacin     REACTION: hives  . Penicillins     REACTION: hives  . Prednisone     REACTION: hives    Social History:  The patient  reports that she quit smoking about 42 years ago. Her smoking use included Cigarettes. She smoked 0.50 packs per day. She does not have any smokeless tobacco  history on file. She reports that she does not drink alcohol or use illicit drugs.   ROS:  Please see the history of present illness.   Denies any syncope, bleeding, orthopnea, PND   PHYSICAL EXAM: VS:  There were no vitals taken for this visit. Well nourished, well developed, in no acute distress, walks with cane, slowed down. HEENT: normal Neck: no JVD Cardiac:  normal S1, S2; RRR; no obvious murmur Lungs:  clear to auscultation bilaterally, no wheezing, rhonchi or rales Abd: soft, nontender, no hepatomegaly Ext: no edema Skin: warm and dry Neuro: no focal abnormalities noted  Creatinine 0.7 on 10/14, liver functions normal.  EKG:  12/02/13-will rhythm, sinus arrhythmia, left axis deviation/left anterior fascicular block, LVH, nonspecific ST changes, heart rate 73    ECHO 2012:   1. Normal LV size and function.  2. There  were no regional wall motion abnormalities.  3. Left ventricular ejection fraction estimated by 2D at 60-65 percent.  4. Mild mitral valve regurgitation.  5. Trivial tricuspid regurgitation.  ASSESSMENT AND PLAN:  1. Left ventricular systolic dysfunction-now resolved, doing well. Continue to encourage activity. This should help with her fatigue. 2. Mitral regurgitation-previously characterized as severe but now mild. Doing well. 3. Hypertension-currently reasonable controlled. Off of hydralazine and amlodipine.Off lisinopril now, on losartan.  Decreased coreg because of dizziness. Symptoms of orthostatic hypotension. She is doing better with decrease in medication.  4. Edema - QOD lasix, Good looking ankles.  5. Left anterior fascicular block-chronic, unchanged 6. Lung cancer-currently finished with radiation. Formally known as BAC. Dr. Tammi Klippel in January.  7. 4 month follow up. (Daughter requests)   Signed, Candee Furbish, MD [facility]  11/18/2014 9:35 AM

## 2014-11-18 NOTE — Patient Instructions (Signed)
The current medical regimen is effective;  continue present plan and medications.  Follow up in 4 months with Dr. Marlou Porch.  You will receive a letter in the mail 2 months before you are due.  Please call us when you receive this letter to schedule your follow up appointment.

## 2014-12-20 ENCOUNTER — Other Ambulatory Visit (HOSPITAL_BASED_OUTPATIENT_CLINIC_OR_DEPARTMENT_OTHER): Payer: Medicare Other

## 2014-12-20 ENCOUNTER — Ambulatory Visit (HOSPITAL_COMMUNITY)
Admission: RE | Admit: 2014-12-20 | Discharge: 2014-12-20 | Disposition: A | Discharge status: Home / Self Care | Payer: Medicare Other | Source: Ambulatory Visit | Attending: Internal Medicine | Admitting: Internal Medicine

## 2014-12-20 ENCOUNTER — Encounter (HOSPITAL_COMMUNITY): Payer: Self-pay

## 2014-12-20 DIAGNOSIS — R05 Cough: Secondary | ICD-10-CM | POA: Insufficient documentation

## 2014-12-20 DIAGNOSIS — R0602 Shortness of breath: Secondary | ICD-10-CM | POA: Diagnosis not present

## 2014-12-20 DIAGNOSIS — C3410 Malignant neoplasm of upper lobe, unspecified bronchus or lung: Secondary | ICD-10-CM

## 2014-12-20 DIAGNOSIS — Z923 Personal history of irradiation: Secondary | ICD-10-CM | POA: Insufficient documentation

## 2014-12-20 DIAGNOSIS — C3431 Malignant neoplasm of lower lobe, right bronchus or lung: Secondary | ICD-10-CM

## 2014-12-20 LAB — CBC WITH DIFFERENTIAL/PLATELET
BASO%: 0.2 % (ref 0.0–2.0)
Basophils Absolute: 0 10*3/uL (ref 0.0–0.1)
EOS ABS: 0.2 10*3/uL (ref 0.0–0.5)
EOS%: 2.3 % (ref 0.0–7.0)
HEMATOCRIT: 36.8 % (ref 34.8–46.6)
HEMOGLOBIN: 11.8 g/dL (ref 11.6–15.9)
LYMPH#: 1.5 10*3/uL (ref 0.9–3.3)
LYMPH%: 18.1 % (ref 14.0–49.7)
MCH: 29.1 pg (ref 25.1–34.0)
MCHC: 32.1 g/dL (ref 31.5–36.0)
MCV: 90.9 fL (ref 79.5–101.0)
MONO#: 0.7 10*3/uL (ref 0.1–0.9)
MONO%: 8.2 % (ref 0.0–14.0)
NEUT%: 71.2 % (ref 38.4–76.8)
NEUTROS ABS: 5.9 10*3/uL (ref 1.5–6.5)
Platelets: 211 10*3/uL (ref 145–400)
RBC: 4.05 10*6/uL (ref 3.70–5.45)
RDW: 14.7 % — AB (ref 11.2–14.5)
WBC: 8.3 10*3/uL (ref 3.9–10.3)

## 2014-12-20 LAB — COMPREHENSIVE METABOLIC PANEL (CC13)
ALT: 18 U/L (ref 0–55)
AST: 21 U/L (ref 5–34)
Albumin: 3.7 g/dL (ref 3.5–5.0)
Alkaline Phosphatase: 101 U/L (ref 40–150)
Anion Gap: 8 mEq/L (ref 3–11)
BILIRUBIN TOTAL: 0.63 mg/dL (ref 0.20–1.20)
BUN: 15.4 mg/dL (ref 7.0–26.0)
CO2: 30 mEq/L — ABNORMAL HIGH (ref 22–29)
Calcium: 9.2 mg/dL (ref 8.4–10.4)
Chloride: 107 mEq/L (ref 98–109)
Creatinine: 1 mg/dL (ref 0.6–1.1)
EGFR: 57 mL/min/{1.73_m2} — ABNORMAL LOW (ref >90)
GLUCOSE: 133 mg/dL (ref 70–140)
Potassium: 3.8 mEq/L (ref 3.5–5.1)
Sodium: 144 mEq/L (ref 136–145)
Total Protein: 7.6 g/dL (ref 6.4–8.3)

## 2014-12-20 MED ORDER — IOHEXOL 300 MG/ML  SOLN
80.0000 mL | Freq: Once | INTRAMUSCULAR | Status: AC | PRN
  Administered 2014-12-20: 80 mL via INTRAVENOUS

## 2014-12-21 ENCOUNTER — Other Ambulatory Visit: Payer: Self-pay

## 2014-12-27 ENCOUNTER — Telehealth: Payer: Self-pay | Admitting: Internal Medicine

## 2014-12-27 ENCOUNTER — Encounter: Payer: Self-pay | Admitting: Internal Medicine

## 2014-12-27 ENCOUNTER — Ambulatory Visit (HOSPITAL_BASED_OUTPATIENT_CLINIC_OR_DEPARTMENT_OTHER): Payer: Medicare Other | Admitting: Internal Medicine

## 2014-12-27 VITALS — BP 183/104 | HR 113 | Temp 98.1°F | Resp 18 | Ht 67.0 in | Wt 144.3 lb

## 2014-12-27 DIAGNOSIS — M549 Dorsalgia, unspecified: Secondary | ICD-10-CM

## 2014-12-27 DIAGNOSIS — I10 Essential (primary) hypertension: Secondary | ICD-10-CM

## 2014-12-27 DIAGNOSIS — C3431 Malignant neoplasm of lower lobe, right bronchus or lung: Secondary | ICD-10-CM

## 2014-12-27 DIAGNOSIS — C3491 Malignant neoplasm of unspecified part of right bronchus or lung: Secondary | ICD-10-CM

## 2014-12-27 DIAGNOSIS — R911 Solitary pulmonary nodule: Secondary | ICD-10-CM

## 2014-12-27 NOTE — Telephone Encounter (Signed)
Gave avs & cal for July

## 2014-12-27 NOTE — Progress Notes (Signed)
[facility] [phone]   Fax:([phone]) [phone]  OFFICE PROGRESS NOTE  Kandice Hams, MD [address] [place] [address]  DIAGNOSIS: Stage IA (T1b, N0, M0) non-small cell lung cancer consistent with adenocarcinoma involving the right lower lobe, diagnosed in September 2014.  PRIOR THERAPY: Status post curative stereotactic radiotherapy to the right lower lobe lung nodule under the care of Dr. Tammi Klippel completed on 10/29/2013.  CURRENT THERAPY: Observation.  INTERVAL HISTORY: Joan Hunter 81 y.o. female returns to the clinic today for six-month followup visit accompanied by her daughter. The patient is feeling fine today was no specific complaints except for intermittent back pain. The patient denied having any chest pain, shortness of breath or hemoptysis. She has no fever or chills, no nausea or vomiting. She denied having any significant weight loss or night sweats. She had repeat CT scan of the chest performed recently and she is here for evaluation and discussion of her scan results.  MEDICAL HISTORY: Past Medical History  Diagnosis Date  . CHF (congestive heart failure)   . Hyperlipidemia   . Hypertension   . Arthritis   . Dilated cardiomyopathy     now resolved, EF 60-65% echo 10/11/11 (Dr. Marlou Porch)  . CAD (coronary artery disease)     mod 2V CAD (60% LAD, 70-80% mid CX) by 01/2008 cath  . Lung mass   . Allergic rhinitis   . Diverticulosis   . Osteopenia   . MR (mitral regurgitation)   . Diabetes mellitus     dx 10 plus yrs ago    ALLERGIES:  is allergic to moxifloxacin; penicillins; and prednisone.  MEDICATIONS:  Current Outpatient Prescriptions  Medication Sig Dispense Refill  . aspirin 81 MG tablet Take 81 mg by mouth daily.    Marland Kitchen atorvastatin (LIPITOR) 40 MG tablet Take 40 mg by mouth daily.    . carvedilol (COREG) 12.5 MG tablet Take 1 tablet (12.5 mg total) by mouth 2 (two) times daily with a meal. 60  tablet 2  . furosemide (LASIX) 20 MG tablet Take 40 mg by mouth daily. Take 1/2 tab every other day    . gabapentin (NEURONTIN) 100 MG capsule Take 100 mg by mouth 3 (three) times daily as needed. Pain    . glipiZIDE (GLUCOTROL) 5 MG tablet Take 2.5 mg by mouth daily.    Marland Kitchen losartan (COZAAR) 50 MG tablet Take 50 mg by mouth daily.    . Naproxen Sodium (ALEVE) 220 MG CAPS Take 1 capsule by mouth daily as needed.     No current facility-administered medications for this visit.    SURGICAL HISTORY:  Past Surgical History  Procedure Laterality Date  . Abdominal hysterectomy    . Appendectomy    . Knee arthroscopy      ?  left  knee  . Eye surgery      bil cataracts  . Tonsillectomy      REVIEW OF SYSTEMS:  A comprehensive review of systems was negative except for: Musculoskeletal: positive for back pain   PHYSICAL EXAMINATION: General appearance: alert, cooperative and no distress Head: Normocephalic, without obvious abnormality, atraumatic Neck: no adenopathy, no JVD, supple, symmetrical, trachea midline and thyroid not enlarged, symmetric, no tenderness/mass/nodules Lymph nodes: Cervical, supraclavicular, and axillary nodes normal. Resp: clear to auscultation bilaterally Back: symmetric, no curvature. ROM normal. No CVA tenderness. Cardio: regular rate and rhythm, S1, S2 normal, no murmur, click, rub or gallop GI: soft, non-tender; bowel  sounds normal; no masses,  no organomegaly Extremities: extremities normal, atraumatic, no cyanosis or edema  ECOG PERFORMANCE STATUS: 1 - Symptomatic but completely ambulatory  Blood pressure 183/104, pulse 113, temperature 98.1 F (36.7 C), temperature source Oral, resp. rate 18, height 5\' 7"  (1.702 m), weight 144 lb 4.8 oz (65.454 kg), SpO2 99 %.  LABORATORY DATA: Lab Results  Component Value Date   WBC 8.3 12/20/2014   HGB 11.8 12/20/2014   HCT 36.8 12/20/2014   MCV 90.9 12/20/2014   PLT 211 12/20/2014      Chemistry      Component  Value Date/Time   NA 144 12/20/2014 1004   NA 142 06/15/2012 0943   K 3.8 12/20/2014 1004   K 3.8 06/15/2012 0943   CL 104 06/15/2012 0943   CO2 30* 12/20/2014 1004   CO2 25 06/15/2012 0943   BUN 15.4 12/20/2014 1004   BUN 17 08/05/2013 1500   CREATININE 1.0 12/20/2014 1004   CREATININE 0.91 08/05/2013 1457   CREATININE 0.80 06/15/2012 0943      Component Value Date/Time   CALCIUM 9.2 12/20/2014 1004   CALCIUM 9.3 06/15/2012 0943   ALKPHOS 101 12/20/2014 1004   ALKPHOS 81 06/15/2012 0943   AST 21 12/20/2014 1004   AST 20 06/15/2012 0943   ALT 18 12/20/2014 1004   ALT 11 06/15/2012 0943   BILITOT 0.63 12/20/2014 1004   BILITOT 0.5 06/15/2012 0943       RADIOGRAPHIC STUDIES: Ct Chest W Contrast  12/20/2014   CLINICAL DATA:  Followup right lung adenocarcinoma. Cough. Shortness of breath. Previous radiation therapy.  EXAM: CT CHEST WITH CONTRAST  TECHNIQUE: Multidetector CT imaging of the chest was performed during intravenous contrast administration.  CONTRAST:  77mL OMNIPAQUE IOHEXOL 300 MG/ML  SOLN  COMPARISON:  06/16/2014  FINDINGS: Mediastinum/Lymph Nodes: No masses or pathologically enlarged lymph nodes identified. Stable moderate hiatal hernia.  Lungs/Pleura: Radiation changes in the central right lung are stable in appearance. 11 mm subpleural nodule in the lateral right lung base on image 43 is stable. Other scattered sub-cm bilateral pulmonary nodules are also stable. Mild emphysema again noted.  Musculoskeletal/Soft Tissues: No suspicious bone lesions identified or significant chest wall abnormality.  Upper Abdomen:  Unremarkable.  IMPRESSION: Stable 11 mm subpleural right lower lobe pulmonary nodule, and other scattered bilateral sub-cm pulmonary nodules, likely postinflammatory in etiology.  Stable right lung radiation changes.  No new or progressive disease within the thorax.  Moderate hiatal hernia.   Electronically Signed   By: Earle Gell M.D.   On: 12/20/2014 12:51    ASSESSMENT AND PLAN: This is a very pleasant 81 years old [place] American female with history of stage IA non-small cell lung cancer involving the right lower lobe status post curative stereotactic radiotherapy completed in November 2014 and she is currently on observation. The recent CT scan of the chest showed no significant evidence for disease progression but several bilateral pulmonary nodules suspicious for inflammatory nodules in addition to the stable right lower lobe pulmonary nodule.  The scan showed no evidence for metastatic disease to the bone. I discussed the scan results and showed the images to the patient and her daughter. I recommended for her to continue on observation with repeat CT scan of the chest in 6 months. For hypertension, the patient did not take her blood pressure medication earlier today. I strongly encouraged her to take her antihypertensive medication regularly as prescribed and to discuss with her primary care physician to  see if she needs any adjustment of these medications. She was advised to call immediately if she has any concerning symptoms in the interval.  The patient voices understanding of current disease status and treatment options and is in agreement with the current care plan.  All questions were answered. The patient knows to call the clinic with any problems, questions or concerns. We can certainly see the patient much sooner if necessary.  Disclaimer: This note was dictated with voice recognition software. Similar sounding words can inadvertently be transcribed and may not be corrected upon review.

## 2014-12-28 ENCOUNTER — Ambulatory Visit: Payer: Self-pay | Admitting: Internal Medicine

## 2014-12-29 ENCOUNTER — Telehealth: Payer: Self-pay | Admitting: *Deleted

## 2014-12-29 ENCOUNTER — Ambulatory Visit
Admission: RE | Admit: 2014-12-29 | Discharge: 2014-12-29 | Disposition: A | Discharge status: Home / Self Care | Payer: Medicare Other | Source: Ambulatory Visit | Attending: Radiation Oncology | Admitting: Radiation Oncology

## 2014-12-29 ENCOUNTER — Encounter: Payer: Self-pay | Admitting: Radiation Oncology

## 2014-12-29 VITALS — BP 191/102 | HR 73 | Resp 16 | Wt 145.3 lb

## 2014-12-29 DIAGNOSIS — C3491 Malignant neoplasm of unspecified part of right bronchus or lung: Secondary | ICD-10-CM

## 2014-12-29 NOTE — Progress Notes (Signed)
Patient with any significant complaints today except for intermittent back pain. Blood pressure elevated. Patient scheduled to follow up with PCP today reference BP. Patient denies chest pain, shortness of breath or hemoptysis. Denies headache, dizziness, fevers, chills or night sweats.

## 2014-12-29 NOTE — Telephone Encounter (Signed)
CALLED PATIENT TO INFORM OF CT ON 06-27-15 AND HER FU VISIT WITH DR. MANNING ON 06-29-15, SPOKE WITH PATIENT'S DAUGHTER [name] DILLARD AND SHE IS AWARE OF THESE APPTS.

## 2014-12-29 NOTE — Progress Notes (Signed)
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 Non-small cell cancer of right lung 162.9 C34.91     Interval Since Last Radiation:  13  months  Narrative:  The patient returns today for routine follow-up.  Patient with any significant complaints today except for intermittent back pain. Blood pressure elevated. Patient scheduled to follow up with PCP today reference BP. Patient denies chest pain, shortness of breath or hemoptysis. Denies headache, dizziness, fevers, chills or night sweats.                               ALLERGIES:  is allergic to moxifloxacin; penicillins; and prednisone.  Meds: Current Outpatient Prescriptions  Medication Sig Dispense Refill  . albuterol (PROVENTIL) (2.5 MG/3ML) 0.083% nebulizer solution Take 2.5 mg by nebulization every 6 (six) hours as needed for wheezing or shortness of breath.    Marland Kitchen aspirin 81 MG tablet Take 81 mg by mouth daily.    Marland Kitchen atorvastatin (LIPITOR) 40 MG tablet Take 40 mg by mouth daily.    . carvedilol (COREG) 12.5 MG tablet Take 1 tablet (12.5 mg total) by mouth 2 (two) times daily with a meal. 60 tablet 2  . furosemide (LASIX) 20 MG tablet Take 40 mg by mouth daily. Take 1/2 tab every other day    . gabapentin (NEURONTIN) 100 MG capsule Take 100 mg by mouth 3 (three) times daily as needed. Pain    . glipiZIDE (GLUCOTROL) 5 MG tablet Take 2.5 mg by mouth daily.    Marland Kitchen losartan (COZAAR) 50 MG tablet Take 50 mg by mouth daily.    . Naproxen Sodium (ALEVE) 220 MG CAPS Take 1 capsule by mouth daily as needed.      No current facility-administered medications for this encounter.    Physical Findings: The patient is in no acute distress. Patient is alert and oriented.  weight is 145 lb 4.8 oz (65.908 kg). Her blood pressure is 191/102 and her pulse is 73. Her respiration is 16 and oxygen saturation is 97%. .  No significant changes.  Lab Findings: Lab Results  Component Value Date   WBC 8.3 12/20/2014   HGB 11.8 12/20/2014   HCT 36.8 12/20/2014   MCV 90.9 12/20/2014   PLT 211 12/20/2014    @LASTCHEM @  Radiographic Findings: Ct Chest W Contrast  12/20/2014   CLINICAL DATA:  Followup right lung adenocarcinoma. Cough. Shortness of breath. Previous radiation therapy.  EXAM: CT CHEST WITH CONTRAST  TECHNIQUE: Multidetector CT imaging of the chest was performed during intravenous contrast administration.  CONTRAST:  31mL OMNIPAQUE IOHEXOL 300 MG/ML  SOLN  COMPARISON:  06/16/2014  FINDINGS: Mediastinum/Lymph Nodes: No masses or pathologically enlarged lymph nodes identified. Stable moderate hiatal hernia.  Lungs/Pleura: Radiation changes in the central right lung are stable in appearance. 11 mm subpleural nodule in the lateral right lung base on image 43 is stable. Other scattered sub-cm bilateral pulmonary nodules are also stable. Mild emphysema again noted.  Musculoskeletal/Soft Tissues: No suspicious  bone lesions identified or significant chest wall abnormality.  Upper Abdomen:  Unremarkable.  IMPRESSION: Stable 11 mm subpleural right lower lobe pulmonary nodule, and other scattered bilateral sub-cm pulmonary nodules, likely postinflammatory in etiology.  Stable right lung radiation changes.  No new or progressive disease within the thorax.  Moderate hiatal hernia.   Electronically Signed   By: Earle Gell M.D.   On: 12/20/2014 12:51    Impression:  The patient is recovering from the effects of radiation.  CT looks OK.  Plan:  CT in 6 months then  follow-up.  _____________________________________  Sheral Apley. Tammi Klippel, M.D.

## 2015-01-17 ENCOUNTER — Encounter: Payer: Self-pay | Admitting: Gastroenterology

## 2015-01-31 ENCOUNTER — Other Ambulatory Visit: Payer: Self-pay | Admitting: Internal Medicine

## 2015-01-31 ENCOUNTER — Ambulatory Visit
Admission: RE | Admit: 2015-01-31 | Discharge: 2015-01-31 | Disposition: A | Discharge status: Home / Self Care | Payer: Medicare Other | Source: Ambulatory Visit | Attending: Internal Medicine | Admitting: Internal Medicine

## 2015-01-31 DIAGNOSIS — M546 Pain in thoracic spine: Secondary | ICD-10-CM

## 2015-02-08 ENCOUNTER — Inpatient Hospital Stay (HOSPITAL_COMMUNITY)
Admission: EM | Admit: 2015-02-08 | Discharge: 2015-02-11 | DRG: 197 | Disposition: A | Discharge status: Home / Self Care | Payer: Medicare Other | Attending: Internal Medicine | Admitting: Internal Medicine

## 2015-02-08 ENCOUNTER — Encounter (HOSPITAL_COMMUNITY): Payer: Self-pay

## 2015-02-08 ENCOUNTER — Emergency Department (HOSPITAL_COMMUNITY): Payer: Medicare Other

## 2015-02-08 DIAGNOSIS — Z9049 Acquired absence of other specified parts of digestive tract: Secondary | ICD-10-CM | POA: Diagnosis present

## 2015-02-08 DIAGNOSIS — Z7982 Long term (current) use of aspirin: Secondary | ICD-10-CM

## 2015-02-08 DIAGNOSIS — C3491 Malignant neoplasm of unspecified part of right bronchus or lung: Secondary | ICD-10-CM | POA: Diagnosis present

## 2015-02-08 DIAGNOSIS — M1990 Unspecified osteoarthritis, unspecified site: Secondary | ICD-10-CM | POA: Diagnosis present

## 2015-02-08 DIAGNOSIS — R0602 Shortness of breath: Secondary | ICD-10-CM

## 2015-02-08 DIAGNOSIS — R000 Tachycardia, unspecified: Secondary | ICD-10-CM

## 2015-02-08 DIAGNOSIS — E876 Hypokalemia: Secondary | ICD-10-CM

## 2015-02-08 DIAGNOSIS — Z880 Allergy status to penicillin: Secondary | ICD-10-CM

## 2015-02-08 DIAGNOSIS — J8410 Pulmonary fibrosis, unspecified: Principal | ICD-10-CM | POA: Diagnosis present

## 2015-02-08 DIAGNOSIS — Z90710 Acquired absence of both cervix and uterus: Secondary | ICD-10-CM

## 2015-02-08 DIAGNOSIS — Z9841 Cataract extraction status, right eye: Secondary | ICD-10-CM

## 2015-02-08 DIAGNOSIS — Z87891 Personal history of nicotine dependence: Secondary | ICD-10-CM

## 2015-02-08 DIAGNOSIS — M438X4 Other specified deforming dorsopathies, thoracic region: Secondary | ICD-10-CM | POA: Diagnosis present

## 2015-02-08 DIAGNOSIS — J441 Chronic obstructive pulmonary disease with (acute) exacerbation: Secondary | ICD-10-CM | POA: Diagnosis present

## 2015-02-08 DIAGNOSIS — R05 Cough: Secondary | ICD-10-CM | POA: Diagnosis not present

## 2015-02-08 DIAGNOSIS — Z85118 Personal history of other malignant neoplasm of bronchus and lung: Secondary | ICD-10-CM

## 2015-02-08 DIAGNOSIS — Z9981 Dependence on supplemental oxygen: Secondary | ICD-10-CM

## 2015-02-08 DIAGNOSIS — I5032 Chronic diastolic (congestive) heart failure: Secondary | ICD-10-CM | POA: Diagnosis present

## 2015-02-08 DIAGNOSIS — Z9842 Cataract extraction status, left eye: Secondary | ICD-10-CM

## 2015-02-08 DIAGNOSIS — Z881 Allergy status to other antibiotic agents status: Secondary | ICD-10-CM

## 2015-02-08 DIAGNOSIS — I420 Dilated cardiomyopathy: Secondary | ICD-10-CM | POA: Diagnosis present

## 2015-02-08 DIAGNOSIS — I2510 Atherosclerotic heart disease of native coronary artery without angina pectoris: Secondary | ICD-10-CM | POA: Diagnosis present

## 2015-02-08 DIAGNOSIS — I10 Essential (primary) hypertension: Secondary | ICD-10-CM | POA: Diagnosis present

## 2015-02-08 DIAGNOSIS — E119 Type 2 diabetes mellitus without complications: Secondary | ICD-10-CM

## 2015-02-08 DIAGNOSIS — R0609 Other forms of dyspnea: Secondary | ICD-10-CM

## 2015-02-08 DIAGNOSIS — R059 Cough, unspecified: Secondary | ICD-10-CM

## 2015-02-08 DIAGNOSIS — M8580 Other specified disorders of bone density and structure, unspecified site: Secondary | ICD-10-CM | POA: Diagnosis present

## 2015-02-08 DIAGNOSIS — E785 Hyperlipidemia, unspecified: Secondary | ICD-10-CM | POA: Diagnosis present

## 2015-02-08 NOTE — ED Notes (Signed)
Patient has had nonproductive cough all day.  She reports feeling weak and out of breath.

## 2015-02-09 ENCOUNTER — Encounter (HOSPITAL_COMMUNITY): Payer: Self-pay

## 2015-02-09 ENCOUNTER — Emergency Department (HOSPITAL_COMMUNITY): Payer: Medicare Other

## 2015-02-09 DIAGNOSIS — E785 Hyperlipidemia, unspecified: Secondary | ICD-10-CM | POA: Diagnosis present

## 2015-02-09 DIAGNOSIS — E119 Type 2 diabetes mellitus without complications: Secondary | ICD-10-CM

## 2015-02-09 DIAGNOSIS — Z9981 Dependence on supplemental oxygen: Secondary | ICD-10-CM | POA: Diagnosis not present

## 2015-02-09 DIAGNOSIS — C3491 Malignant neoplasm of unspecified part of right bronchus or lung: Secondary | ICD-10-CM

## 2015-02-09 DIAGNOSIS — R05 Cough: Secondary | ICD-10-CM | POA: Diagnosis present

## 2015-02-09 DIAGNOSIS — Z9049 Acquired absence of other specified parts of digestive tract: Secondary | ICD-10-CM | POA: Diagnosis present

## 2015-02-09 DIAGNOSIS — M8580 Other specified disorders of bone density and structure, unspecified site: Secondary | ICD-10-CM | POA: Diagnosis present

## 2015-02-09 DIAGNOSIS — I2510 Atherosclerotic heart disease of native coronary artery without angina pectoris: Secondary | ICD-10-CM

## 2015-02-09 DIAGNOSIS — Z7982 Long term (current) use of aspirin: Secondary | ICD-10-CM | POA: Diagnosis not present

## 2015-02-09 DIAGNOSIS — Z85118 Personal history of other malignant neoplasm of bronchus and lung: Secondary | ICD-10-CM | POA: Diagnosis not present

## 2015-02-09 DIAGNOSIS — J8410 Pulmonary fibrosis, unspecified: Principal | ICD-10-CM

## 2015-02-09 DIAGNOSIS — J441 Chronic obstructive pulmonary disease with (acute) exacerbation: Secondary | ICD-10-CM | POA: Diagnosis present

## 2015-02-09 DIAGNOSIS — Z880 Allergy status to penicillin: Secondary | ICD-10-CM | POA: Diagnosis not present

## 2015-02-09 DIAGNOSIS — E876 Hypokalemia: Secondary | ICD-10-CM | POA: Diagnosis present

## 2015-02-09 DIAGNOSIS — I10 Essential (primary) hypertension: Secondary | ICD-10-CM | POA: Diagnosis present

## 2015-02-09 DIAGNOSIS — Z9842 Cataract extraction status, left eye: Secondary | ICD-10-CM | POA: Diagnosis not present

## 2015-02-09 DIAGNOSIS — Z87891 Personal history of nicotine dependence: Secondary | ICD-10-CM | POA: Diagnosis not present

## 2015-02-09 DIAGNOSIS — M438X4 Other specified deforming dorsopathies, thoracic region: Secondary | ICD-10-CM | POA: Diagnosis present

## 2015-02-09 DIAGNOSIS — Z9841 Cataract extraction status, right eye: Secondary | ICD-10-CM | POA: Diagnosis not present

## 2015-02-09 DIAGNOSIS — Z90710 Acquired absence of both cervix and uterus: Secondary | ICD-10-CM | POA: Diagnosis not present

## 2015-02-09 DIAGNOSIS — R000 Tachycardia, unspecified: Secondary | ICD-10-CM | POA: Diagnosis present

## 2015-02-09 DIAGNOSIS — I5032 Chronic diastolic (congestive) heart failure: Secondary | ICD-10-CM | POA: Diagnosis present

## 2015-02-09 DIAGNOSIS — M1990 Unspecified osteoarthritis, unspecified site: Secondary | ICD-10-CM | POA: Diagnosis present

## 2015-02-09 DIAGNOSIS — I420 Dilated cardiomyopathy: Secondary | ICD-10-CM | POA: Diagnosis present

## 2015-02-09 DIAGNOSIS — Z881 Allergy status to other antibiotic agents status: Secondary | ICD-10-CM | POA: Diagnosis not present

## 2015-02-09 LAB — CBC WITH DIFFERENTIAL/PLATELET
Basophils Absolute: 0 10*3/uL (ref 0.0–0.1)
Basophils Absolute: 0 10*3/uL (ref 0.0–0.1)
Basophils Relative: 0 % (ref 0–1)
Basophils Relative: 0 % (ref 0–1)
EOS ABS: 0.1 10*3/uL (ref 0.0–0.7)
EOS ABS: 0.2 10*3/uL (ref 0.0–0.7)
Eosinophils Relative: 2 % (ref 0–5)
Eosinophils Relative: 1 % (ref 0–5)
HCT: 41.2 % (ref 36.0–46.0)
HCT: 37.1 % (ref 36.0–46.0)
HEMOGLOBIN: 12.8 g/dL (ref 12.0–15.0)
Hemoglobin: 13.3 g/dL (ref 12.0–15.0)
Lymphocytes Relative: 10 % — ABNORMAL LOW (ref 12–46)
Lymphocytes Relative: 5 % — ABNORMAL LOW (ref 12–46)
Lymphs Abs: 1 10*3/uL (ref 0.7–4.0)
Lymphs Abs: 0.5 10*3/uL — ABNORMAL LOW (ref 0.7–4.0)
MCH: 30.8 pg (ref 26.0–34.0)
MCH: 28.9 pg (ref 26.0–34.0)
MCHC: 34.5 g/dL (ref 30.0–36.0)
MCHC: 32.3 g/dL (ref 30.0–36.0)
MCV: 89.6 fL (ref 78.0–100.0)
MCV: 89.2 fL (ref 78.0–100.0)
MONO ABS: 0.3 10*3/uL (ref 0.1–1.0)
MONOS PCT: 3 % (ref 3–12)
Monocytes Absolute: 0.7 10*3/uL (ref 0.1–1.0)
Monocytes Relative: 7 % (ref 3–12)
NEUTROS ABS: 8.1 10*3/uL — AB (ref 1.7–7.7)
NEUTROS PCT: 91 % — AB (ref 43–77)
Neutro Abs: 9.7 10*3/uL — ABNORMAL HIGH (ref 1.7–7.7)
Neutrophils Relative %: 81 % — ABNORMAL HIGH (ref 43–77)
PLATELETS: 211 10*3/uL (ref 150–400)
Platelets: 163 10*3/uL (ref 150–400)
RBC: 4.16 MIL/uL (ref 3.87–5.11)
RBC: 4.6 MIL/uL (ref 3.87–5.11)
RDW: 13.6 % (ref 11.5–15.5)
RDW: 13.7 % (ref 11.5–15.5)
WBC: 9.9 10*3/uL (ref 4.0–10.5)
WBC: 10.5 10*3/uL (ref 4.0–10.5)

## 2015-02-09 LAB — COMPREHENSIVE METABOLIC PANEL
ALT: 17 U/L (ref 0–35)
ANION GAP: 10 (ref 5–15)
AST: 29 U/L (ref 0–37)
Albumin: 3.7 g/dL (ref 3.5–5.2)
Alkaline Phosphatase: 97 U/L (ref 39–117)
BUN: 10 mg/dL (ref 6–23)
CALCIUM: 8.8 mg/dL (ref 8.4–10.5)
CO2: 26 mmol/L (ref 19–32)
CREATININE: 0.84 mg/dL (ref 0.50–1.10)
Chloride: 103 mmol/L (ref 96–112)
GFR calc non Af Amer: 61 mL/min — ABNORMAL LOW (ref >90)
GFR, EST AFRICAN AMERICAN: 71 mL/min — AB (ref >90)
GLUCOSE: 194 mg/dL — AB (ref 70–99)
Potassium: 3 mmol/L — ABNORMAL LOW (ref 3.5–5.1)
Sodium: 139 mmol/L (ref 135–145)
TOTAL PROTEIN: 7.9 g/dL (ref 6.0–8.3)
Total Bilirubin: 0.6 mg/dL (ref 0.3–1.2)

## 2015-02-09 LAB — GLUCOSE, CAPILLARY
GLUCOSE-CAPILLARY: 151 mg/dL — AB (ref 70–99)
Glucose-Capillary: 153 mg/dL — ABNORMAL HIGH (ref 70–99)
Glucose-Capillary: 169 mg/dL — ABNORMAL HIGH (ref 70–99)

## 2015-02-09 LAB — EXPECTORATED SPUTUM ASSESSMENT W REFEX TO RESP CULTURE

## 2015-02-09 LAB — BASIC METABOLIC PANEL
Anion gap: 10 (ref 5–15)
BUN: 14 mg/dL (ref 6–23)
CALCIUM: 9.2 mg/dL (ref 8.4–10.5)
CO2: 29 mmol/L (ref 19–32)
CREATININE: 0.88 mg/dL (ref 0.50–1.10)
Chloride: 104 mmol/L (ref 96–112)
GFR calc Af Amer: 67 mL/min — ABNORMAL LOW (ref >90)
GFR, EST NON AFRICAN AMERICAN: 58 mL/min — AB (ref >90)
GLUCOSE: 170 mg/dL — AB (ref 70–99)
Potassium: 3.1 mmol/L — ABNORMAL LOW (ref 3.5–5.1)
SODIUM: 143 mmol/L (ref 135–145)

## 2015-02-09 LAB — STREP PNEUMONIAE URINARY ANTIGEN: STREP PNEUMO URINARY ANTIGEN: NEGATIVE

## 2015-02-09 LAB — BLOOD GAS, VENOUS
Acid-Base Excess: 4.2 mmol/L — ABNORMAL HIGH (ref 0.0–2.0)
BICARBONATE: 28.5 meq/L — AB (ref 20.0–24.0)
Drawn by: 259821
O2 Content: 3 L/min
O2 Saturation: 56.8 %
PCO2 VEN: 42.8 mmHg — AB (ref 45.0–50.0)
PH VEN: 7.437 — AB (ref 7.250–7.300)
Patient temperature: 98.4
TCO2: 25.4 mmol/L (ref 0–100)

## 2015-02-09 LAB — PROTIME-INR
INR: 1.14 (ref 0.00–1.49)
Prothrombin Time: 14.7 seconds (ref 11.6–15.2)

## 2015-02-09 LAB — BLOOD GAS, ARTERIAL
Acid-Base Excess: 2.3 mmol/L — ABNORMAL HIGH (ref 0.0–2.0)
BICARBONATE: 26 meq/L — AB (ref 20.0–24.0)
DRAWN BY: 42624
O2 Content: 4 L/min
O2 Saturation: 97 %
PCO2 ART: 38.7 mmHg (ref 35.0–45.0)
Patient temperature: 98.6
TCO2: 23.1 mmol/L (ref 0–100)
pH, Arterial: 7.442 (ref 7.350–7.450)
pO2, Arterial: 91.9 mmHg (ref 80.0–100.0)

## 2015-02-09 LAB — TSH: TSH: 0.724 u[IU]/mL (ref 0.350–4.500)

## 2015-02-09 LAB — I-STAT CG4 LACTIC ACID, ED: Lactic Acid, Venous: 1.81 mmol/L (ref 0.5–2.0)

## 2015-02-09 LAB — I-STAT TROPONIN, ED: TROPONIN I, POC: 0 ng/mL (ref 0.00–0.08)

## 2015-02-09 LAB — BRAIN NATRIURETIC PEPTIDE: B NATRIURETIC PEPTIDE 5: 31.7 pg/mL (ref 0.0–100.0)

## 2015-02-09 LAB — POC OCCULT BLOOD, ED: Fecal Occult Bld: NEGATIVE

## 2015-02-09 MED ORDER — POTASSIUM CHLORIDE CRYS ER 20 MEQ PO TBCR
20.0000 meq | EXTENDED_RELEASE_TABLET | Freq: Three times a day (TID) | ORAL | Status: AC
  Administered 2015-02-09 (×3): 20 meq via ORAL
  Filled 2015-02-09 (×3): qty 1

## 2015-02-09 MED ORDER — ATORVASTATIN CALCIUM 40 MG PO TABS
40.0000 mg | ORAL_TABLET | Freq: Every day | ORAL | Status: DC
  Administered 2015-02-09 – 2015-02-10 (×2): 40 mg via ORAL
  Filled 2015-02-09 (×3): qty 1

## 2015-02-09 MED ORDER — ONDANSETRON HCL 4 MG/2ML IJ SOLN: 4.0000 mg | Freq: Four times a day (QID) | INTRAMUSCULAR | Status: DC | PRN

## 2015-02-09 MED ORDER — SODIUM CHLORIDE 0.9 % IJ SOLN
3.0000 mL | Freq: Two times a day (BID) | INTRAMUSCULAR | Status: DC
  Administered 2015-02-09 – 2015-02-10 (×2): 3 mL via INTRAVENOUS

## 2015-02-09 MED ORDER — LEVALBUTEROL HCL 0.63 MG/3ML IN NEBU
0.6300 mg | INHALATION_SOLUTION | Freq: Four times a day (QID) | RESPIRATORY_TRACT | Status: DC
  Administered 2015-02-09 – 2015-02-11 (×8): 0.63 mg via RESPIRATORY_TRACT
  Filled 2015-02-09 (×8): qty 3

## 2015-02-09 MED ORDER — METHYLPREDNISOLONE SODIUM SUCC 125 MG IJ SOLR
60.0000 mg | Freq: Two times a day (BID) | INTRAMUSCULAR | Status: DC
  Administered 2015-02-09 – 2015-02-10 (×3): 60 mg via INTRAVENOUS
  Filled 2015-02-09 (×5): qty 0.96

## 2015-02-09 MED ORDER — ACETAMINOPHEN 325 MG PO TABS
650.0000 mg | ORAL_TABLET | Freq: Four times a day (QID) | ORAL | Status: DC | PRN
  Administered 2015-02-09: 650 mg via ORAL
  Filled 2015-02-09: qty 2

## 2015-02-09 MED ORDER — FUROSEMIDE 40 MG PO TABS
40.0000 mg | ORAL_TABLET | Freq: Every day | ORAL | Status: DC
  Administered 2015-02-09 – 2015-02-11 (×3): 40 mg via ORAL
  Filled 2015-02-09 (×3): qty 1

## 2015-02-09 MED ORDER — FENTANYL CITRATE 0.05 MG/ML IJ SOLN
25.0000 ug | Freq: Once | INTRAMUSCULAR | Status: AC
  Administered 2015-02-09: 25 ug via INTRAVENOUS
  Filled 2015-02-09: qty 2

## 2015-02-09 MED ORDER — CETYLPYRIDINIUM CHLORIDE 0.05 % MT LIQD
7.0000 mL | Freq: Two times a day (BID) | OROMUCOSAL | Status: DC
  Administered 2015-02-09 – 2015-02-11 (×4): 7 mL via OROMUCOSAL

## 2015-02-09 MED ORDER — ACETAMINOPHEN 650 MG RE SUPP: 650.0000 mg | Freq: Four times a day (QID) | RECTAL | Status: DC | PRN

## 2015-02-09 MED ORDER — ALBUTEROL SULFATE (2.5 MG/3ML) 0.083% IN NEBU: 3.0000 mL | INHALATION_SOLUTION | Freq: Four times a day (QID) | RESPIRATORY_TRACT | Status: DC | PRN

## 2015-02-09 MED ORDER — IOHEXOL 350 MG/ML SOLN
100.0000 mL | Freq: Once | INTRAVENOUS | Status: AC | PRN
  Administered 2015-02-09: 100 mL via INTRAVENOUS

## 2015-02-09 MED ORDER — GUAIFENESIN ER 600 MG PO TB12
600.0000 mg | ORAL_TABLET | Freq: Two times a day (BID) | ORAL | Status: DC
  Administered 2015-02-09 – 2015-02-11 (×5): 600 mg via ORAL
  Filled 2015-02-09 (×7): qty 1

## 2015-02-09 MED ORDER — INSULIN ASPART 100 UNIT/ML ~~LOC~~ SOLN
0.0000 [IU] | Freq: Three times a day (TID) | SUBCUTANEOUS | Status: DC
Start: 2015-02-09 — End: 2015-02-11
  Administered 2015-02-09 – 2015-02-10 (×3): 3 [IU] via SUBCUTANEOUS
  Administered 2015-02-10: 2 [IU] via SUBCUTANEOUS
  Administered 2015-02-10: 3 [IU] via SUBCUTANEOUS
  Administered 2015-02-11: 5 [IU] via SUBCUTANEOUS

## 2015-02-09 MED ORDER — INSULIN ASPART 100 UNIT/ML ~~LOC~~ SOLN: 0.0000 [IU] | Freq: Every day | SUBCUTANEOUS | Status: DC

## 2015-02-09 MED ORDER — CEFTRIAXONE SODIUM IN DEXTROSE 20 MG/ML IV SOLN
1.0000 g | INTRAVENOUS | Status: DC
  Administered 2015-02-09 – 2015-02-11 (×3): 1 g via INTRAVENOUS
  Filled 2015-02-09 (×3): qty 50

## 2015-02-09 MED ORDER — ASPIRIN 81 MG PO CHEW
81.0000 mg | CHEWABLE_TABLET | Freq: Every day | ORAL | Status: DC
  Administered 2015-02-09 – 2015-02-11 (×3): 81 mg via ORAL
  Filled 2015-02-09 (×3): qty 1

## 2015-02-09 MED ORDER — BENZONATATE 100 MG PO CAPS
100.0000 mg | ORAL_CAPSULE | Freq: Three times a day (TID) | ORAL | Status: DC
  Administered 2015-02-09 (×3): 100 mg via ORAL
  Filled 2015-02-09 (×6): qty 1

## 2015-02-09 MED ORDER — SODIUM CHLORIDE 0.9 % IV BOLUS (SEPSIS)
500.0000 mL | Freq: Once | INTRAVENOUS | Status: AC
  Administered 2015-02-09: 500 mL via INTRAVENOUS

## 2015-02-09 MED ORDER — IPRATROPIUM BROMIDE 0.02 % IN SOLN
0.5000 mg | Freq: Four times a day (QID) | RESPIRATORY_TRACT | Status: DC
Start: 2015-02-09 — End: 2015-02-11
  Administered 2015-02-09 – 2015-02-11 (×8): 0.5 mg via RESPIRATORY_TRACT
  Filled 2015-02-09 (×8): qty 2.5

## 2015-02-09 MED ORDER — ONDANSETRON HCL 4 MG PO TABS: 4.0000 mg | ORAL_TABLET | Freq: Four times a day (QID) | ORAL | Status: DC | PRN

## 2015-02-09 MED ORDER — ALBUTEROL SULFATE (2.5 MG/3ML) 0.083% IN NEBU
5.0000 mg | INHALATION_SOLUTION | Freq: Once | RESPIRATORY_TRACT | Status: AC
  Administered 2015-02-09: 5 mg via RESPIRATORY_TRACT
  Filled 2015-02-09: qty 6

## 2015-02-09 MED ORDER — TRAMADOL HCL 50 MG PO TABS: 50.0000 mg | ORAL_TABLET | Freq: Two times a day (BID) | ORAL | Status: DC | PRN

## 2015-02-09 MED ORDER — ENOXAPARIN SODIUM 40 MG/0.4ML ~~LOC~~ SOLN
40.0000 mg | SUBCUTANEOUS | Status: DC
  Administered 2015-02-09 – 2015-02-11 (×3): 40 mg via SUBCUTANEOUS
  Filled 2015-02-09 (×3): qty 0.4

## 2015-02-09 MED ORDER — AMLODIPINE BESYLATE 5 MG PO TABS
5.0000 mg | ORAL_TABLET | Freq: Every day | ORAL | Status: DC
  Administered 2015-02-09 – 2015-02-11 (×3): 5 mg via ORAL
  Filled 2015-02-09 (×3): qty 1

## 2015-02-09 MED ORDER — ONDANSETRON HCL 4 MG/2ML IJ SOLN: 4.0000 mg | Freq: Three times a day (TID) | INTRAMUSCULAR | Status: DC | PRN

## 2015-02-09 MED ORDER — DEXTROSE 5 % IV SOLN
500.0000 mg | INTRAVENOUS | Status: DC
  Administered 2015-02-09 – 2015-02-11 (×3): 500 mg via INTRAVENOUS
  Filled 2015-02-09 (×3): qty 500

## 2015-02-09 MED ORDER — CARVEDILOL 12.5 MG PO TABS
12.5000 mg | ORAL_TABLET | Freq: Two times a day (BID) | ORAL | Status: DC
  Administered 2015-02-09 – 2015-02-11 (×5): 12.5 mg via ORAL
  Filled 2015-02-09 (×7): qty 1

## 2015-02-09 NOTE — ED Provider Notes (Signed)
CSN: [id number]     Arrival date & time 02/08/15  2308 History   First MD Initiated Contact with Patient 02/08/15 2319     Chief Complaint  Patient presents with  . Cough     (Consider location/radiation/quality/duration/timing/severity/associated sxs/prior Treatment) HPI Comments: Patient is a 81 year old female with a past medical history of non small cell lung cancer, CHF, hypertension, hyperlipidemia, CAD, and diabetes who presents with a cough that started earlier today. Symptoms started gradually and progressively worsened since the onset. The cough is non productive. She reports associated generalized weakness and SOB. The SOB is worse with exertion. She denies fever or any other associated symptoms. No alleviating factors. No recent travel, surgery, immobilization, exogenous estrogen or history of DVT/PE.    Past Medical History  Diagnosis Date  . CHF (congestive heart failure)   . Hyperlipidemia   . Hypertension   . Arthritis   . Dilated cardiomyopathy     now resolved, EF 60-65% echo 10/11/11 (Dr. Marlou Porch)  . CAD (coronary artery disease)     mod 2V CAD (60% LAD, 70-80% mid CX) by 01/2008 cath  . Lung mass   . Allergic rhinitis   . Diverticulosis   . Osteopenia   . MR (mitral regurgitation)   . Diabetes mellitus     dx 10 plus yrs ago   Past Surgical History  Procedure Laterality Date  . Abdominal hysterectomy    . Appendectomy    . Knee arthroscopy      ?  left  knee  . Eye surgery      bil cataracts  . Tonsillectomy     Family History  Problem Relation Age of Onset  . Colon cancer Mother   . Heart disease Brother    History  Substance Use Topics  . Smoking status: Former Smoker -- 0.50 packs/day    Types: Cigarettes    Quit date: 06/15/1972  . Smokeless tobacco: Not on file  . Alcohol Use: No   OB History    No data available     Review of Systems  Constitutional: Negative for fever, chills and fatigue.  HENT: Negative for trouble swallowing.    Eyes: Negative for visual disturbance.  Respiratory: Positive for cough and shortness of breath.   Cardiovascular: Negative for chest pain and palpitations.  Gastrointestinal: Negative for nausea, vomiting, abdominal pain and diarrhea.  Genitourinary: Negative for dysuria and difficulty urinating.  Musculoskeletal: Negative for arthralgias and neck pain.  Skin: Negative for color change.  Neurological: Positive for weakness. Negative for dizziness.  Psychiatric/Behavioral: Negative for dysphoric mood.      Allergies  Moxifloxacin; Penicillins; and Prednisone  Home Medications   Prior to Admission medications   Medication Sig Start Date End Date Taking? Authorizing Provider  albuterol (PROVENTIL HFA;VENTOLIN HFA) 108 (90 BASE) MCG/ACT inhaler Inhale 2 puffs into the lungs every 6 (six) hours as needed for wheezing or shortness of breath.   Yes Historical Provider, MD  amLODipine (NORVASC) 5 MG tablet Take 5 mg by mouth daily.   Yes Historical Provider, MD  aspirin 81 MG tablet Take 81 mg by mouth daily.   Yes Historical Provider, MD  atorvastatin (LIPITOR) 40 MG tablet Take 40 mg by mouth daily.   Yes Historical Provider, MD  carvedilol (COREG) 12.5 MG tablet Take 1 tablet (12.5 mg total) by mouth 2 (two) times daily with a meal. 10/12/14  Yes Candee Furbish, MD  furosemide (LASIX) 20 MG tablet Take 40 mg by  mouth daily. Take 1 tab every other day   Yes Historical Provider, MD  gabapentin (NEURONTIN) 100 MG capsule Take 100 mg by mouth 3 (three) times daily as needed. Pain   Yes Historical Provider, MD  glipiZIDE (GLUCOTROL) 5 MG tablet Take 2.5 mg by mouth daily.   Yes Historical Provider, MD  traMADol (ULTRAM) 50 MG tablet Take 50 mg by mouth every 12 (twelve) hours as needed for moderate pain.   Yes Historical Provider, MD   BP 151/91 mmHg  Pulse 131  Temp(Src) 98.4 F (36.9 C) (Oral)  Resp 22  SpO2 95% Physical Exam  Constitutional: She is oriented to person, place, and time.  She appears well-developed and well-nourished. No distress.  HENT:  Head: Normocephalic and atraumatic.  Eyes: Conjunctivae and EOM are normal.  Neck: Normal range of motion.  Cardiovascular: Regular rhythm.  Exam reveals no gallop and no friction rub.   No murmur heard. tachycardia  Pulmonary/Chest: Effort normal. She has wheezes. She has no rales. She exhibits no tenderness.  Expiratory wheezes noted in all lung fields.   Abdominal: Soft. She exhibits no distension. There is no tenderness. There is no rebound.  Musculoskeletal: Normal range of motion.  No lower extremity swelling or calf tenderness to palpation.   Neurological: She is alert and oriented to person, place, and time. Coordination normal.  Speech is goal-oriented. Moves limbs without ataxia.   Skin: Skin is warm and dry.  Psychiatric: She has a normal mood and affect. Her behavior is normal.  Nursing note and vitals reviewed.   ED Course  Procedures (including critical care time) Labs Review Labs Reviewed  CBC WITH DIFFERENTIAL/PLATELET - Abnormal; Notable for the following:    Neutrophils Relative % 81 (*)    Neutro Abs 8.1 (*)    Lymphocytes Relative 10 (*)    All other components within normal limits  BASIC METABOLIC PANEL - Abnormal; Notable for the following:    Potassium 3.1 (*)    Glucose, Bld 170 (*)    GFR calc non Af Amer 58 (*)    GFR calc Af Amer 67 (*)    All other components within normal limits  BRAIN NATRIURETIC PEPTIDE  TSH  I-STAT TROPOININ, ED  I-STAT CG4 LACTIC ACID, ED    Imaging Review Dg Chest 2 View  02/09/2015   CLINICAL DATA:  Cough for 24 hr. History of CHF. Shortness of breath for several hr.  EXAM: CHEST  2 VIEW  COMPARISON:  CT chest 12/20/2014.  Chest 09/01/2013.  FINDINGS: Technically limited study due to motion artifact. Mild cardiac enlargement without vascular congestion. Linear atelectasis or scarring in the right mid lung. No blunting of costophrenic angles. No  pneumothorax. Tortuous aorta. Esophageal hiatal hernia behind the heart.  IMPRESSION: Cardiac enlargement. Linear atelectasis or scarring in the right mid lung. Esophageal hiatal hernia behind the heart. No active consolidation.   Electronically Signed   By: Lucienne Capers M.D.   On: 02/09/2015 00:03   Ct Angio Chest Pe W/cm &/or Wo Cm  02/09/2015   CLINICAL DATA:  81 year old female with cough and shortness of breath. History of right-sided lung cancer post radiation.  EXAM: CT ANGIOGRAPHY CHEST WITH CONTRAST  TECHNIQUE: Multidetector CT imaging of the chest was performed using the standard protocol during bolus administration of intravenous contrast. Multiplanar CT image reconstructions and MIPs were obtained to evaluate the vascular anatomy.  CONTRAST:  170mL OMNIPAQUE IOHEXOL 350 MG/ML SOLN  COMPARISON:  Chest CT 12/20/2014  FINDINGS:  There are no filling defects within the pulmonary arteries to suggest pulmonary embolus.  Radiation change in the right perihilar lung, similar to prior exam. 11 mm subpleural nodule in the right costophrenic sulcus, unchanged. Multiple small subcentimeter nodules in the right middle and upper lobes are also unchanged. Mild emphysematous change, stable from prior. There is no consolidation. Stable perifissural nodules in the left lung.  There is no pleural or pericardial effusion. There is a prominent subcarinal lymph node measuring 17 mm in short axis dimension, this appears increased in size from prior exam. There is otherwise no mediastinal or left hilar adenopathy. The thoracic aorta is normal in caliber with mild atherosclerosis. Heart size is normal, coronary artery calcifications are seen.  Moderate hiatal hernia. No acute abnormality in the included upper abdomen.  New from prior exam, mild anterior compression deformity of T5 vertebral body, vertebral body hemangioma anteriorly is less well-defined than on prior exam. There is mild sclerosis in the posterior aspect of  the vertebral body. There is sclerosis involving the posterior aspect of the T6 vertebral body with minimal compression deformity of the superior endplate.  Review of the MIP images confirms the above findings.  IMPRESSION: 1. No pulmonary embolus. 2. Prominent subcarinal lymph node that is increased in size from prior exam. While this may be reactive, given history lung malignancy, nodal metastasis is not excluded. 3. New compression deformities of T5 and T6 vertebral body is with scleroses posteriorly. This scleroses is new from prior exam. Given development over the course of less than 2 months, osteoporotic compression fractures are favored, less likely blastic metastatic disease. This could be further assessed with MRI based on clinical concern. 4. Stable right lower lobe pulmonary nodule on posttreatment related change of the right perihilar lung.   Electronically Signed   By: Jeb Levering M.D.   On: 02/09/2015 02:06     EKG Interpretation   Date/Time:  Thursday February 09 2015 00:36:32 EST Ventricular Rate:  129 PR Interval:  127 QRS Duration: 113 QT Interval:  318 QTC Calculation: 466 R Axis:   -56 Text Interpretation:  Sinus tachycardia Abnormal R-wave progression, late  transition LVH with IVCD, LAD and secondary repol abnrm Nonspecific ST and  T wave abnormality Compared to previous tracing Confirmed by NANAVATI, MD,  ANKIT ([id number]) on 02/09/2015 12:39:42 AM      MDM   Final diagnoses:  Cough  SOB (shortness of breath)  Exertional dyspnea  Tachycardia  Hypokalemia  Dependence on supplemental oxygen   1:17 AM Labs unremarkable for acute changes. Chest xray shows cardiac enlargement with linear atelectasis in the right mid lung. Patient will have CT angio to rule out PE given history of lung cancer.   2:38 AM CT angio unremarkable for acute changes. Patient's oxygen saturation now dropped to 85% on room air at rest. Patient will be admitted for oxygen dependence. Patient  will have albuterol nebulizer.   Alvina Chou, PA-C 02/09/15 9480  Varney Biles, MD 02/09/15 [phone]

## 2015-02-09 NOTE — Progress Notes (Signed)
ANTIBIOTIC CONSULT NOTE - INITIAL  Pharmacy Consult for Rocephin  Indication: COPD exacerbation  Allergies  Allergen Reactions  . Moxifloxacin     REACTION: hives  . Penicillins     REACTION: hives  . Prednisone     REACTION: hives    Patient Measurements:   Wt=65kg  Vital Signs: Temp: 100.5 F (38.1 C) (02/25 0548) Temp Source: Oral (02/25 0548) BP: 129/70 mmHg (02/25 0548) Pulse Rate: 113 (02/25 0548) Intake/Output from previous day:   Intake/Output from this shift:    Labs:  Recent Labs  02/08/15 2348  WBC 9.9  HGB 13.3  PLT 211  CREATININE 0.88   CrCl cannot be calculated (Unknown ideal weight.). No results for input(s): VANCOTROUGH, VANCOPEAK, VANCORANDOM, GENTTROUGH, GENTPEAK, GENTRANDOM, TOBRATROUGH, TOBRAPEAK, TOBRARND, AMIKACINPEAK, AMIKACINTROU, AMIKACIN in the last 72 hours.   Microbiology: No results found for this or any previous visit (from the past 720 hour(s)).  Medical History: Past Medical History  Diagnosis Date  . CHF (congestive heart failure)   . Hyperlipidemia   . Hypertension   . Arthritis   . Dilated cardiomyopathy     now resolved, EF 60-65% echo 10/11/11 (Dr. Marlou Porch)  . CAD (coronary artery disease)     mod 2V CAD (60% LAD, 70-80% mid CX) by 01/2008 cath  . Lung mass   . Allergic rhinitis   . Diverticulosis   . Osteopenia   . MR (mitral regurgitation)   . Diabetes mellitus     dx 10 plus yrs ago    Medications:  Scheduled:  . amLODipine  5 mg Oral Daily  . aspirin  81 mg Oral Daily  . atorvastatin  40 mg Oral Daily  . azithromycin  500 mg Intravenous Q24H  . benzonatate  100 mg Oral TID  . carvedilol  12.5 mg Oral BID WC  . cefTRIAXone (ROCEPHIN)  IV  1 g Intravenous Q24H  . enoxaparin (LOVENOX) injection  40 mg Subcutaneous Q24H  . furosemide  40 mg Oral Daily  . guaiFENesin  600 mg Oral BID  . ipratropium  0.5 mg Nebulization Q6H  . levalbuterol  0.63 mg Nebulization Q6H  . methylPREDNISolone (SOLU-MEDROL)  injection  60 mg Intravenous Q12H  . sodium chloride  3 mL Intravenous Q12H   Infusions:   Assessment: 88 yoF c/o weakness, SOB and cough.  Rocephin per Rx and Zmax per MD for COPD exacerbation.  Goal of Therapy:  Treat infection  Plan:   Rocephin 1Gm IV q24h  F/u SCr/cultures as needed  Lawana Pai R 02/09/2015,5:54 AM

## 2015-02-09 NOTE — ED Notes (Signed)
Pt. Oxygen level 85%, pt. Placed on 2 liters oxygen per RN,Amy.PA,Kaitlyn made aware.

## 2015-02-09 NOTE — ED Notes (Signed)
Returned from CT.

## 2015-02-09 NOTE — ED Notes (Signed)
RT notified of neb orders.

## 2015-02-09 NOTE — ED Notes (Signed)
EKG given to EDP, [place], MD., for review.

## 2015-02-09 NOTE — H&P (Signed)
[facility] History and Physical  Patient: Joan Hunter  MRN: [id number]  DOB: 12-08-1927  DOS: the patient was seen and examined on 02/09/2015 PCP: Kandice Hams, MD  Chief Complaint: Shortness of breath and cough  HPI: Joan GRIEVES is a 81 y.o. female with Past medical history of non-small cell lung cancer on surveillance, chronic diastolic CHF, coronary artery disease, osteopenia, mitral regurgitation, diabetes mellitus, hypertension. The patient presented with complains of cough. Symptoms have been ongoing for last 1 week progressively worsening. She also has progressively worsening shortness of breath. Denies any dizziness or lightheadedness denies any chest pain. Denies any diarrhea abdominal pain nausea or vomiting or burning urination. She complains of back pain that started over last 3 days. She denies any focal deficit loss of control of bowel or bladder. Denies any other recent change in medications.  The patient is coming from home And at her baseline independent for most of her ADL.  Review of Systems: as mentioned in the history of present illness.  A Comprehensive review of the other systems is negative.  Past Medical History  Diagnosis Date  . CHF (congestive heart failure)   . Hyperlipidemia   . Hypertension   . Arthritis   . Dilated cardiomyopathy     now resolved, EF 60-65% echo 10/11/11 (Dr. Marlou Porch)  . CAD (coronary artery disease)     mod 2V CAD (60% LAD, 70-80% mid CX) by 01/2008 cath  . Lung mass   . Allergic rhinitis   . Diverticulosis   . Osteopenia   . MR (mitral regurgitation)   . Diabetes mellitus     dx 10 plus yrs ago   Past Surgical History  Procedure Laterality Date  . Abdominal hysterectomy    . Appendectomy    . Knee arthroscopy      ?  left  knee  . Eye surgery      bil cataracts  . Tonsillectomy     Social History:  reports that she quit smoking about 42 years ago. Her smoking use included Cigarettes. She smoked  0.50 packs per day. She does not have any smokeless tobacco history on file. She reports that she does not drink alcohol or use illicit drugs.  Allergies  Allergen Reactions  . Moxifloxacin     REACTION: hives  . Penicillins     REACTION: hives  . Prednisone     REACTION: hives    Family History  Problem Relation Age of Onset  . Colon cancer Mother   . Heart disease Brother     Prior to Admission medications   Medication Sig Start Date End Date Taking? Authorizing Provider  albuterol (PROVENTIL HFA;VENTOLIN HFA) 108 (90 BASE) MCG/ACT inhaler Inhale 2 puffs into the lungs every 6 (six) hours as needed for wheezing or shortness of breath.   Yes Historical Provider, MD  amLODipine (NORVASC) 5 MG tablet Take 5 mg by mouth daily.   Yes Historical Provider, MD  aspirin 81 MG tablet Take 81 mg by mouth daily.   Yes Historical Provider, MD  atorvastatin (LIPITOR) 40 MG tablet Take 40 mg by mouth daily.   Yes Historical Provider, MD  carvedilol (COREG) 12.5 MG tablet Take 1 tablet (12.5 mg total) by mouth 2 (two) times daily with a meal. 10/12/14  Yes Candee Furbish, MD  furosemide (LASIX) 20 MG tablet Take 40 mg by mouth daily. Take 1 tab every other day   Yes Historical Provider, MD  gabapentin (NEURONTIN) 100 MG capsule  Take 100 mg by mouth 3 (three) times daily as needed. Pain   Yes Historical Provider, MD  glipiZIDE (GLUCOTROL) 5 MG tablet Take 2.5 mg by mouth daily.   Yes Historical Provider, MD  traMADol (ULTRAM) 50 MG tablet Take 50 mg by mouth every 12 (twelve) hours as needed for moderate pain.   Yes Historical Provider, MD    Physical Exam: Filed Vitals:   02/09/15 0300 02/09/15 0313 02/09/15 0423 02/09/15 0548  BP: 190/81  180/92 129/70  Pulse: 119  121 113  Temp:    100.5 F (38.1 C)  TempSrc:    Oral  Resp:   20 18  SpO2: 92% 95% 92% 95%    General: Alert, Awake and Oriented to Time, Place and Person. Appear in mild distress Eyes: PERRL ENT: Oral Mucosa clear  moist. Neck: no JVD Cardiovascular: S1 and S2 Present, aortic systolic Murmur, Peripheral Pulses Present Respiratory: Bilateral Air entry equal and Decreased, bilateral extensive expiratory wheezes Abdomen: Bowel Sound present, Soft and non tender Skin: no Rash Extremities: no Pedal edema, no calf tenderness Neurologic: Grossly no focal neuro deficit.  Labs on Admission:  CBC:  Recent Labs Lab 02/08/15 2348  WBC 9.9  NEUTROABS 8.1*  HGB 13.3  HCT 41.2  MCV 89.6  PLT 211    CMP     Component Value Date/Time   NA 143 02/08/2015 2348   NA 144 12/20/2014 1004   K 3.1* 02/08/2015 2348   K 3.8 12/20/2014 1004   CL 104 02/08/2015 2348   CO2 29 02/08/2015 2348   CO2 30* 12/20/2014 1004   GLUCOSE 170* 02/08/2015 2348   GLUCOSE 133 12/20/2014 1004   BUN 14 02/08/2015 2348   BUN 15.4 12/20/2014 1004   CREATININE 0.88 02/08/2015 2348   CREATININE 1.0 12/20/2014 1004   CREATININE 0.91 08/05/2013 1457   CALCIUM 9.2 02/08/2015 2348   CALCIUM 9.2 12/20/2014 1004   PROT 7.6 12/20/2014 1004   PROT 7.4 06/15/2012 0943   ALBUMIN 3.7 12/20/2014 1004   ALBUMIN 4.0 06/15/2012 0943   AST 21 12/20/2014 1004   AST 20 06/15/2012 0943   ALT 18 12/20/2014 1004   ALT 11 06/15/2012 0943   ALKPHOS 101 12/20/2014 1004   ALKPHOS 81 06/15/2012 0943   BILITOT 0.63 12/20/2014 1004   BILITOT 0.5 06/15/2012 0943   GFRNONAA 58* 02/08/2015 2348   GFRAA 67* 02/08/2015 2348    No results for input(s): LIPASE, AMYLASE in the last 168 hours.  No results for input(s): CKTOTAL, CKMB, CKMBINDEX, TROPONINI in the last 168 hours. BNP (last 3 results)  Recent Labs  02/09/15 0046  BNP 31.7    ProBNP (last 3 results) No results for input(s): PROBNP in the last 8760 hours.   Radiological Exams on Admission: Dg Chest 2 View  02/09/2015   CLINICAL DATA:  Cough for 24 hr. History of CHF. Shortness of breath for several hr.  EXAM: CHEST  2 VIEW  COMPARISON:  CT chest 12/20/2014.  Chest 09/01/2013.   FINDINGS: Technically limited study due to motion artifact. Mild cardiac enlargement without vascular congestion. Linear atelectasis or scarring in the right mid lung. No blunting of costophrenic angles. No pneumothorax. Tortuous aorta. Esophageal hiatal hernia behind the heart.  IMPRESSION: Cardiac enlargement. Linear atelectasis or scarring in the right mid lung. Esophageal hiatal hernia behind the heart. No active consolidation.   Electronically Signed   By: Lucienne Capers M.D.   On: 02/09/2015 00:03   Ct Angio Chest Pe  W/cm &/or Wo Cm  02/09/2015   CLINICAL DATA:  81 year old female with cough and shortness of breath. History of right-sided lung cancer post radiation.  EXAM: CT ANGIOGRAPHY CHEST WITH CONTRAST  TECHNIQUE: Multidetector CT imaging of the chest was performed using the standard protocol during bolus administration of intravenous contrast. Multiplanar CT image reconstructions and MIPs were obtained to evaluate the vascular anatomy.  CONTRAST:  145mL OMNIPAQUE IOHEXOL 350 MG/ML SOLN  COMPARISON:  Chest CT 12/20/2014  FINDINGS: There are no filling defects within the pulmonary arteries to suggest pulmonary embolus.  Radiation change in the right perihilar lung, similar to prior exam. 11 mm subpleural nodule in the right costophrenic sulcus, unchanged. Multiple small subcentimeter nodules in the right middle and upper lobes are also unchanged. Mild emphysematous change, stable from prior. There is no consolidation. Stable perifissural nodules in the left lung.  There is no pleural or pericardial effusion. There is a prominent subcarinal lymph node measuring 17 mm in short axis dimension, this appears increased in size from prior exam. There is otherwise no mediastinal or left hilar adenopathy. The thoracic aorta is normal in caliber with mild atherosclerosis. Heart size is normal, coronary artery calcifications are seen.  Moderate hiatal hernia. No acute abnormality in the included upper abdomen.   New from prior exam, mild anterior compression deformity of T5 vertebral body, vertebral body hemangioma anteriorly is less well-defined than on prior exam. There is mild sclerosis in the posterior aspect of the vertebral body. There is sclerosis involving the posterior aspect of the T6 vertebral body with minimal compression deformity of the superior endplate.  Review of the MIP images confirms the above findings.  IMPRESSION: 1. No pulmonary embolus. 2. Prominent subcarinal lymph node that is increased in size from prior exam. While this may be reactive, given history lung malignancy, nodal metastasis is not excluded. 3. New compression deformities of T5 and T6 vertebral body is with scleroses posteriorly. This scleroses is new from prior exam. Given development over the course of less than 2 months, osteoporotic compression fractures are favored, less likely blastic metastatic disease. This could be further assessed with MRI based on clinical concern. 4. Stable right lower lobe pulmonary nodule on posttreatment related change of the right perihilar lung.   Electronically Signed   By: Jeb Levering M.D.   On: 02/09/2015 02:06    EKG: Independently reviewed. sinus tachycardia.  Assessment/Plan Principal Problem:   COPD exacerbation Active Problems:   Diabetes mellitus type 2 in nonobese   Essential hypertension   PULMONARY FIBROSIS, POSTINFLAMMATORY   Non-small cell cancer of right lung   CAD (coronary artery disease)   1. COPD exacerbation The patient is presenting with complaints of progressively worsening shortness of breath associated with cough. A CT of the chest is negative for any pneumonia or any pulmonary embolism. Most likely sure presentation is acute bronchiolitis versus COPD exacerbation. The patient will be started on steroids, IV antibiotics, DuoNeb's, I would check sputum culture urine antigens. Patient will be closely monitored on telemetry. Patient does have history of  allergy with prednisone but she was okay attempting to be treated with Solu-Medrol.  2. Diabetes mellitus type 2. Holding oral hypoglycemic agent and placing the patient on sliding scale.  3. Non-small cell cancer of the lung. 11 mm subpleural nodule and 17 mm subcarinal lymph node appear to be increasing in size from prior exam. We will require follow-up with primary oncologist as an outpatient.  4.T5 compression deformity. Patient has doubled  up back pain since last 2 days and her CT scan is confirming possibility of vertebral compression fracture which can Patient's symptoms. Does not have any focal deficit on lower extremity. Continue close monitoring. PTOT consultation. May require a brace  Advance goals of care discussion: Full code  DVT Prophylaxis: subcutaneous Heparin Nutrition: Cardiac and diabetic diet  Family Communication: Daughter was present at bedside, opportunity was given to ask question and all questions were answered satisfactorily at the time of interview. Disposition: Admitted to inpatient in telemetry unit.  Author: Berle Mull, MD [facility] Pager: [phone] 02/09/2015, 6:33 AM    If 7PM-7AM, please contact night-coverage [email or web address] Password [email or web address]

## 2015-02-09 NOTE — Evaluation (Signed)
Physical Therapy Evaluation Patient Details Name: Joan Hunter MRN: [id number] DOB: 16-Apr-1927 Today's Date: 02/09/2015   History of Present Illness  81 yo female admitted with COPD exac. Hx of NSCLC, CHF, DM, osteopenia, HTN.   Clinical Impression  On eval, pt required Min assist for mobility-able to ambulate ~75 feet with straight cane. Unsteady-pt required external assist for stability. May need to consider RW use. Recommend HHPT at discharge.     Follow Up Recommendations Home health PT;Supervision - Intermittent    Equipment Recommendations  Rolling walker with 5" wheels (if pt will agree to use)    Recommendations for Other Services       Precautions / Restrictions Precautions Precautions: Fall Restrictions Weight Bearing Restrictions: No      Mobility  Bed Mobility Overal bed mobility: Needs Assistance Bed Mobility: Supine to Sit     Supine to sit: Supervision     General bed mobility comments: supervision for safety, lines. Increased time. Reliance on bedrail to get to EOB  Transfers Overall transfer level: Needs assistance Equipment used: Rolling walker (2 wheeled) Transfers: Sit to/from Stand Sit to Stand: Min assist         General transfer comment: Assist to rise, stabilize. VCS safety, hand placement. Increased time and several attempts before pt could fully rise.   Ambulation/Gait Ambulation/Gait assistance: Min assist Ambulation Distance (Feet): 75 Feet Assistive device: Straight cane (intermittent use of hallway handrail as well) Gait Pattern/deviations: Step-through pattern;Trunk flexed;Decreased stride length     General Gait Details: Assist to stabilize. Pt was unsteady. Initially pt required use of cane and hallway handrail for support. Remained on [place] O2.   Stairs            Wheelchair Mobility    Modified Rankin (Stroke Patients Only)       Balance Overall balance assessment: Needs assistance;History of Falls          Standing balance support: Single extremity supported;During functional activity Standing balance-Leahy Scale: Poor                               Pertinent Vitals/Pain Pain Assessment: No/denies pain    Home Living Family/patient expects to be discharged to:: Private residence Living Arrangements: Children   Type of Home: House Home Access: Stairs to enter Entrance Stairs-Rails: Right Entrance Stairs-Number of Steps: 4 Home Layout: Multi-level Home Equipment: Cane - single point      Prior Function Level of Independence: Needs assistance      ADL's / Homemaking Assistance Needed: daughter assists pt into/out of tub        Hand Dominance        Extremity/Trunk Assessment   Upper Extremity Assessment: Generalized weakness           Lower Extremity Assessment: Generalized weakness      Cervical / Trunk Assessment: Kyphotic  Communication   Communication: No difficulties  Cognition Arousal/Alertness: Awake/alert Behavior During Therapy: WFL for tasks assessed/performed Overall Cognitive Status: Within Functional Limits for tasks assessed                      General Comments      Exercises        Assessment/Plan    PT Assessment Patient needs continued PT services  PT Diagnosis Difficulty walking;Generalized weakness   PT Problem List Decreased strength;Decreased activity tolerance;Decreased balance;Decreased mobility;Pain  PT Treatment Interventions DME instruction;Gait training;Functional mobility training;Therapeutic  activities;Therapeutic exercise;Patient/family education;Balance training   PT Goals (Current goals can be found in the Care Plan section) Acute Rehab PT Goals Patient Stated Goal: home possibly tomorrow PT Goal Formulation: With patient Time For Goal Achievement: 02/23/15 Potential to Achieve Goals: Good    Frequency Min 3X/week   Barriers to discharge        Co-evaluation               End  of Session Equipment Utilized During Treatment: Gait belt;Oxygen Activity Tolerance: Patient tolerated treatment well Patient left: in chair;with call bell/phone within reach           Time: 4268-3419 PT Time Calculation (min) (ACUTE ONLY): 21 min   Charges:   PT Evaluation $Initial PT Evaluation Tier I: 1 Procedure     PT G Codes:        Weston Anna, MPT Pager: [phone]

## 2015-02-09 NOTE — Progress Notes (Signed)
Progress Note   Joan Hunter DZH:[id number] DOB: [birth year] DOA: 02/08/2015 PCP: Kandice Hams, MD   Brief Narrative:   Joan Hunter is an 81 y.o. female with a PMH of non-small cell lung cancer on surveillance, chronic diastolic CHF, CAD, osteopenia, mitral regurgitation, diabetes, hypertension who was admitted 02/08/15 with a chief complaint of a one-week history of progressively worsening shortness of breath and cough.  Assessment/Plan:   Principal Problem:   COPD exacerbation / pulmonary fibrosis, postinflammatory - CT of the chest was negative for pneumonia and pulmonary embolism. Would continue empiric antibiotics given that she is febrile. - Continue steroids, empiric antibiotics, DuoNeb's, F/U sputum culture urine antigens.  Active Problems:   Hypokalemia - Replete.    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 - Currently being managed with moderate scale SSI. - Neurontin currently on hold.    Essential hypertension - Continue Norvasc, Coreg and Lasix.    Non-small cell cancer of right lung - Not currently undergoing active treatment, on surveillance.    CAD (coronary artery disease) - Stable.    DVT Prophylaxis - Lovenox ordered.  Code Status: Full. Family Communication: No family at the bedside. Disposition Plan: Home when stable.  Lives with daughter.   IV Access:    Peripheral IV   Procedures and diagnostic studies:   Dg Chest 2 View  02/09/2015   CLINICAL DATA:  Cough for 24 hr. History of CHF. Shortness of breath for several hr.  EXAM: CHEST  2 VIEW  COMPARISON:  CT chest 12/20/2014.  Chest 09/01/2013.  FINDINGS: Technically limited study due to motion artifact. Mild cardiac enlargement without vascular congestion. Linear atelectasis or scarring in the right mid lung. No blunting of costophrenic angles. No pneumothorax. Tortuous aorta. Esophageal hiatal hernia behind the heart.   IMPRESSION: Cardiac enlargement. Linear atelectasis or scarring in the right mid lung. Esophageal hiatal hernia behind the heart. No active consolidation.   Electronically Signed   By: Lucienne Capers M.D.   On: 02/09/2015 00:03   Ct Angio Chest Pe W/cm &/or Wo Cm  02/09/2015   CLINICAL DATA:  81 year old female with cough and shortness of breath. History of right-sided lung cancer post radiation.  EXAM: CT ANGIOGRAPHY CHEST WITH CONTRAST  TECHNIQUE: Multidetector CT imaging of the chest was performed using the standard protocol during bolus administration of intravenous contrast. Multiplanar CT image reconstructions and MIPs were obtained to evaluate the vascular anatomy.  CONTRAST:  156mL OMNIPAQUE IOHEXOL 350 MG/ML SOLN  COMPARISON:  Chest CT 12/20/2014  FINDINGS: There are no filling defects within the pulmonary arteries to suggest pulmonary embolus.  Radiation change in the right perihilar lung, similar to prior exam. 11 mm subpleural nodule in the right costophrenic sulcus, unchanged. Multiple small subcentimeter nodules in the right middle and upper lobes are also unchanged. Mild emphysematous change, stable from prior. There is no consolidation. Stable perifissural nodules in the left lung.  There is no pleural or pericardial effusion. There is a prominent subcarinal lymph node measuring 17 mm in short axis dimension, this appears increased in size from prior exam. There is otherwise no mediastinal or left hilar adenopathy. The thoracic aorta is normal in caliber with mild atherosclerosis. Heart size is normal, coronary artery calcifications are seen.  Moderate hiatal hernia. No acute abnormality in the included upper abdomen.  New from prior exam, mild anterior compression deformity of T5 vertebral body, vertebral body hemangioma anteriorly is less well-defined  than on prior exam. There is mild sclerosis in the posterior aspect of the vertebral body. There is sclerosis involving the posterior aspect of  the T6 vertebral body with minimal compression deformity of the superior endplate.  Review of the MIP images confirms the above findings.  IMPRESSION: 1. No pulmonary embolus. 2. Prominent subcarinal lymph node that is increased in size from prior exam. While this may be reactive, given history lung malignancy, nodal metastasis is not excluded. 3. New compression deformities of T5 and T6 vertebral body is with scleroses posteriorly. This scleroses is new from prior exam. Given development over the course of less than 2 months, osteoporotic compression fractures are favored, less likely blastic metastatic disease. This could be further assessed with MRI based on clinical concern. 4. Stable right lower lobe pulmonary nodule on posttreatment related change of the right perihilar lung.   Electronically Signed   By: Jeb Levering M.D.   On: 02/09/2015 02:06      Medical Consultants:    None.  Anti-Infectives:    Rocephin 02/08/15 --->  Azithromycin 02/08/15--->  Subjective:   Joan Hunter says she feels "all right".  Denies dyspnea, occasional cough.  No pain.  No nausea, vomiting or diarrhea.  Appetite OK, but hasn't eaten anything since she's been in the hospital.    Objective:    Filed Vitals:   02/09/15 0313 02/09/15 0423 02/09/15 0548 02/09/15 0750  BP:  180/92 129/70   Pulse:  121 113   Temp:   100.5 F (38.1 C)   TempSrc:   Oral   Resp:  20 18   SpO2: 95% 92% 95% 97%   No intake or output data in the 24 hours ending 02/09/15 1110  Exam: Gen:  NAD Cardiovascular:  Tachy/reg, No M/R/G Respiratory:  Lungs course with scattered rhonchi Gastrointestinal:  Abdomen soft, NT/ND, + BS Extremities:  No C/E/C   Data Reviewed:    Labs: Basic Metabolic Panel:  Recent Labs Lab 02/08/15 2348 02/09/15 0754  NA 143 139  K 3.1* 3.0*  CL 104 103  CO2 29 26  GLUCOSE 170* 194*  BUN 14 10  CREATININE 0.88 0.84  CALCIUM 9.2 8.8   GFR CrCl cannot be calculated  (Unknown ideal weight.).  Coagulation profile  Recent Labs Lab 02/09/15 0754  INR 1.14    CBC:  Recent Labs Lab 02/08/15 2348 02/09/15 0754  WBC 9.9 10.5  NEUTROABS 8.1* 9.7*  HGB 13.3 12.8  HCT 41.2 37.1  MCV 89.6 89.2  PLT 211 163   CBG:  Recent Labs Lab 02/09/15 0835  GLUCAP 153*   Thyroid function studies:  Recent Labs  02/09/15 0247  TSH 0.724   Sepsis Labs:  Recent Labs Lab 02/08/15 2348 02/09/15 0256 02/09/15 0754  WBC 9.9  --  10.5  LATICACIDVEN  --  1.81  --    Microbiology No results found for this or any previous visit (from the past 240 hour(s)).   Medications:   . amLODipine  5 mg Oral Daily  . antiseptic oral rinse  7 mL Mouth Rinse BID  . aspirin  81 mg Oral Daily  . atorvastatin  40 mg Oral Daily  . azithromycin  500 mg Intravenous Q24H  . benzonatate  100 mg Oral TID  . carvedilol  12.5 mg Oral BID WC  . cefTRIAXone (ROCEPHIN)  IV  1 g Intravenous Q24H  . enoxaparin (LOVENOX) injection  40 mg Subcutaneous Q24H  . furosemide  40 mg Oral  Daily  . guaiFENesin  600 mg Oral BID  . insulin aspart  0-15 Units Subcutaneous TID WC  . insulin aspart  0-5 Units Subcutaneous QHS  . ipratropium  0.5 mg Nebulization Q6H  . levalbuterol  0.63 mg Nebulization Q6H  . methylPREDNISolone (SOLU-MEDROL) injection  60 mg Intravenous Q12H  . potassium chloride  20 mEq Oral TID  . sodium chloride  3 mL Intravenous Q12H   Continuous Infusions:   Time spent: 25 minutes.   LOS: 0 days   RAMA,CHRISTINA  [facility] Pager [phone]. If unable to reach me by pager, please call my cell phone at [phone].  *Please refer to [email or web address], password [email or web address] to get updated schedule on who will round on this patient, as hospitalists switch teams weekly. If 7PM-7AM, please contact night-coverage at [email or web address], password [email or web address] for any overnight needs.  02/09/2015, 11:10 AM

## 2015-02-09 NOTE — ED Notes (Signed)
Pt. On monitor.

## 2015-02-09 NOTE — Progress Notes (Signed)
UR complete 

## 2015-02-10 LAB — GLUCOSE, CAPILLARY
GLUCOSE-CAPILLARY: 185 mg/dL — AB (ref 70–99)
Glucose-Capillary: 188 mg/dL — ABNORMAL HIGH (ref 70–99)
Glucose-Capillary: 199 mg/dL — ABNORMAL HIGH (ref 70–99)
Glucose-Capillary: 147 mg/dL — ABNORMAL HIGH (ref 70–99)

## 2015-02-10 LAB — BASIC METABOLIC PANEL
Anion gap: 11 (ref 5–15)
BUN: 23 mg/dL (ref 6–23)
CO2: 26 mmol/L (ref 19–32)
Calcium: 8.8 mg/dL (ref 8.4–10.5)
Chloride: 104 mmol/L (ref 96–112)
Creatinine, Ser: 0.83 mg/dL (ref 0.50–1.10)
GFR calc Af Amer: 72 mL/min — ABNORMAL LOW (ref >90)
GFR calc non Af Amer: 62 mL/min — ABNORMAL LOW (ref >90)
Glucose, Bld: 185 mg/dL — ABNORMAL HIGH (ref 70–99)
POTASSIUM: 3.9 mmol/L (ref 3.5–5.1)
SODIUM: 141 mmol/L (ref 135–145)

## 2015-02-10 LAB — CBC
HEMATOCRIT: 34.6 % — AB (ref 36.0–46.0)
HEMOGLOBIN: 11.2 g/dL — AB (ref 12.0–15.0)
MCH: 28.9 pg (ref 26.0–34.0)
MCHC: 32.4 g/dL (ref 30.0–36.0)
MCV: 89.4 fL (ref 78.0–100.0)
Platelets: 167 10*3/uL (ref 150–400)
RBC: 3.87 MIL/uL (ref 3.87–5.11)
RDW: 13.9 % (ref 11.5–15.5)
WBC: 8.8 10*3/uL (ref 4.0–10.5)

## 2015-02-10 LAB — LEGIONELLA ANTIGEN, URINE

## 2015-02-10 LAB — HEMOGLOBIN A1C
Hgb A1c MFr Bld: 6.6 % — ABNORMAL HIGH (ref 4.8–5.6)
MEAN PLASMA GLUCOSE: 143 mg/dL

## 2015-02-10 MED ORDER — DEXTROMETHORPHAN POLISTIREX 30 MG/5ML PO LQCR
30.0000 mg | Freq: Two times a day (BID) | ORAL | Status: DC
  Administered 2015-02-10 – 2015-02-11 (×3): 30 mg via ORAL
  Filled 2015-02-10 (×4): qty 5

## 2015-02-10 MED ORDER — POLYETHYLENE GLYCOL 3350 17 G PO PACK
17.0000 g | PACK | Freq: Every day | ORAL | Status: DC
  Administered 2015-02-10: 17 g via ORAL
  Filled 2015-02-10 (×2): qty 1

## 2015-02-10 MED ORDER — METHYLPREDNISOLONE SODIUM SUCC 125 MG IJ SOLR
60.0000 mg | INTRAMUSCULAR | Status: DC
  Administered 2015-02-11: 60 mg via INTRAVENOUS
  Filled 2015-02-10 (×2): qty 0.96

## 2015-02-10 NOTE — Progress Notes (Signed)
Progress Note   Joan Hunter FYB:[id number] DOB: [birth year] DOA: 02/08/2015 PCP: Kandice Hams, MD   Brief Narrative:   Joan Hunter is an 81 y.o. female with a PMH of non-small cell lung cancer on surveillance, chronic diastolic CHF, CAD, osteopenia, mitral regurgitation, diabetes, hypertension who was admitted 02/08/15 with a chief complaint of a one-week history of progressively worsening shortness of breath and cough.  Assessment/Plan:   Principal Problem:   COPD exacerbation / pulmonary fibrosis, postinflammatory - CT of the chest was negative for pneumonia and pulmonary embolism. Would continue empiric antibiotics given that she is febrile. - Continue steroids (decrease to 60 mg IV daily), empiric antibiotics, DuoNeb's, F/U sputum culture urine antigens.  Active Problems:   Hypokalemia - Repleted.    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 - Currently being managed with moderate scale SSI. CBGs 151-169. - Neurontin currently on hold.    Essential hypertension - Continue Norvasc, Coreg and Lasix.    Non-small cell cancer of right lung - Not currently undergoing active treatment, on surveillance.    CAD (coronary artery disease) - Stable.    DVT Prophylaxis - Lovenox ordered.  Code Status: Full. Family Communication: No family at the bedside. Disposition Plan: Home when stable.  Lives with daughter.   IV Access:    Peripheral IV   Procedures and diagnostic studies:   Dg Chest 2 View  02/09/2015   CLINICAL DATA:  Cough for 24 hr. History of CHF. Shortness of breath for several hr.  EXAM: CHEST  2 VIEW  COMPARISON:  CT chest 12/20/2014.  Chest 09/01/2013.  FINDINGS: Technically limited study due to motion artifact. Mild cardiac enlargement without vascular congestion. Linear atelectasis or scarring in the right mid lung. No blunting of costophrenic angles. No pneumothorax. Tortuous  aorta. Esophageal hiatal hernia behind the heart.  IMPRESSION: Cardiac enlargement. Linear atelectasis or scarring in the right mid lung. Esophageal hiatal hernia behind the heart. No active consolidation.   Electronically Signed   By: Lucienne Capers M.D.   On: 02/09/2015 00:03   Ct Angio Chest Pe W/cm &/or Wo Cm  02/09/2015   CLINICAL DATA:  81 year old female with cough and shortness of breath. History of right-sided lung cancer post radiation.  EXAM: CT ANGIOGRAPHY CHEST WITH CONTRAST  TECHNIQUE: Multidetector CT imaging of the chest was performed using the standard protocol during bolus administration of intravenous contrast. Multiplanar CT image reconstructions and MIPs were obtained to evaluate the vascular anatomy.  CONTRAST:  115mL OMNIPAQUE IOHEXOL 350 MG/ML SOLN  COMPARISON:  Chest CT 12/20/2014  FINDINGS: There are no filling defects within the pulmonary arteries to suggest pulmonary embolus.  Radiation change in the right perihilar lung, similar to prior exam. 11 mm subpleural nodule in the right costophrenic sulcus, unchanged. Multiple small subcentimeter nodules in the right middle and upper lobes are also unchanged. Mild emphysematous change, stable from prior. There is no consolidation. Stable perifissural nodules in the left lung.  There is no pleural or pericardial effusion. There is a prominent subcarinal lymph node measuring 17 mm in short axis dimension, this appears increased in size from prior exam. There is otherwise no mediastinal or left hilar adenopathy. The thoracic aorta is normal in caliber with mild atherosclerosis. Heart size is normal, coronary artery calcifications are seen.  Moderate hiatal hernia. No acute abnormality in the included upper abdomen.  New from prior exam, mild anterior compression deformity of T5 vertebral  body, vertebral body hemangioma anteriorly is less well-defined than on prior exam. There is mild sclerosis in the posterior aspect of the vertebral body.  There is sclerosis involving the posterior aspect of the T6 vertebral body with minimal compression deformity of the superior endplate.  Review of the MIP images confirms the above findings.  IMPRESSION: 1. No pulmonary embolus. 2. Prominent subcarinal lymph node that is increased in size from prior exam. While this may be reactive, given history lung malignancy, nodal metastasis is not excluded. 3. New compression deformities of T5 and T6 vertebral body is with scleroses posteriorly. This scleroses is new from prior exam. Given development over the course of less than 2 months, osteoporotic compression fractures are favored, less likely blastic metastatic disease. This could be further assessed with MRI based on clinical concern. 4. Stable right lower lobe pulmonary nodule on posttreatment related change of the right perihilar lung.   Electronically Signed   By: Jeb Levering M.D.   On: 02/09/2015 02:06      Medical Consultants:    None.  Anti-Infectives:    Rocephin 02/08/15 --->  Azithromycin 02/08/15--->  Subjective:   Joan Hunter says she feels "all right".  Denies dyspnea, mostly non-productive cough.  No pain.  No nausea, vomiting or diarrhea.  Appetite good.  No BM since admission.    Objective:    Filed Vitals:   02/09/15 1423 02/09/15 2123 02/10/15 0548 02/10/15 0909  BP:  145/76 125/72   Pulse:  91 86   Temp:  98.2 F (36.8 C)    TempSrc:  Oral Oral   Resp:   16   Weight:   65.1 kg (143 lb 8.3 oz)   SpO2: 100% 97% 99% 99%    Intake/Output Summary (Last 24 hours) at 02/10/15 1007 Last data filed at 02/10/15 0900  Gross per 24 hour  Intake    480 ml  Output      0 ml  Net    480 ml    Exam: Gen:  NAD Cardiovascular:  Tachy/reg, No M/R/G Respiratory:  Lungs course with scattered rhonchi Gastrointestinal:  Abdomen soft, NT/ND, + BS Extremities:  No C/E/C   Data Reviewed:    Labs: Basic Metabolic Panel:  Recent Labs Lab 02/08/15 2348  02/09/15 0754 02/10/15 0611  NA 143 139 141  K 3.1* 3.0* 3.9  CL 104 103 104  CO2 29 26 26   GLUCOSE 170* 194* 185*  BUN 14 10 23   CREATININE 0.88 0.84 0.83  CALCIUM 9.2 8.8 8.8   GFR Estimated Creatinine Clearance: 47.3 mL/min (by C-G formula based on Cr of 0.83).  Coagulation profile  Recent Labs Lab 02/09/15 0754  INR 1.14    CBC:  Recent Labs Lab 02/08/15 2348 02/09/15 0754 02/10/15 0611  WBC 9.9 10.5 8.8  NEUTROABS 8.1* 9.7*  --   HGB 13.3 12.8 11.2*  HCT 41.2 37.1 34.6*  MCV 89.6 89.2 89.4  PLT 211 163 167   CBG:  Recent Labs Lab 02/09/15 0835 02/09/15 1630 02/09/15 2210 02/10/15 0806  GLUCAP 153* 169* 151* 188*   Thyroid function studies:  Recent Labs  02/09/15 0247  TSH 0.724   Sepsis Labs:  Recent Labs Lab 02/08/15 2348 02/09/15 0256 02/09/15 0754 02/10/15 0611  WBC 9.9  --  10.5 8.8  LATICACIDVEN  --  1.81  --   --    Microbiology Recent Results (from the past 240 hour(s))  Culture, sputum-assessment     Status: None  Collection Time: 02/09/15 10:28 AM  Result Value Ref Range Status   Specimen Description SPUTUM  Final   Special Requests NONE  Final   Sputum evaluation   Final    THIS SPECIMEN IS ACCEPTABLE. RESPIRATORY CULTURE REPORT TO FOLLOW.   Report Status 02/09/2015 FINAL  Final  Culture, respiratory (NON-Expectorated)     Status: None (Preliminary result)   Collection Time: 02/09/15 10:28 AM  Result Value Ref Range Status   Specimen Description SPUTUM  Final   Special Requests NONE  Final   Gram Stain   Final    ABUNDANT WBC PRESENT, PREDOMINANTLY PMN MODERATE SQUAMOUS EPITHELIAL CELLS PRESENT MODERATE GRAM POSITIVE COCCI IN PAIRS IN CHAINS IN CLUSTERS Performed at [facility]    Culture   Final    NORMAL OROPHARYNGEAL FLORA Performed at [facility]    Report Status PENDING  Incomplete     Medications:   . amLODipine  5 mg Oral Daily  . antiseptic oral rinse  7 mL Mouth Rinse BID  .  aspirin  81 mg Oral Daily  . atorvastatin  40 mg Oral Daily  . azithromycin  500 mg Intravenous Q24H  . carvedilol  12.5 mg Oral BID WC  . cefTRIAXone (ROCEPHIN)  IV  1 g Intravenous Q24H  . dextromethorphan  30 mg Oral BID  . enoxaparin (LOVENOX) injection  40 mg Subcutaneous Q24H  . furosemide  40 mg Oral Daily  . guaiFENesin  600 mg Oral BID  . insulin aspart  0-15 Units Subcutaneous TID WC  . insulin aspart  0-5 Units Subcutaneous QHS  . ipratropium  0.5 mg Nebulization Q6H  . levalbuterol  0.63 mg Nebulization Q6H  . [START ON 02/11/2015] methylPREDNISolone (SOLU-MEDROL) injection  60 mg Intravenous Q24H  . sodium chloride  3 mL Intravenous Q12H   Continuous Infusions:   Time spent: 25 minutes.   LOS: 1 day   RAMA,CHRISTINA  [facility] Pager [phone]. If unable to reach me by pager, please call my cell phone at [phone].  *Please refer to [email or web address], password [email or web address] to get updated schedule on who will round on this patient, as hospitalists switch teams weekly. If 7PM-7AM, please contact night-coverage at [email or web address], password [email or web address] for any overnight needs.  02/10/2015, 10:07 AM

## 2015-02-10 NOTE — Progress Notes (Signed)
PT Cancellation Note  Patient Details Name: SIMISOLA SANDLES MRN: [id number] DOB: 08/03/1927   Cancelled Treatment:    Reason Eval/Treat Not Completed: Patient declined, no reason specified. Attempted PT tx session. Pt declined to participate stating "Im just fine. I don't need therapy. I'll walk when I get home."  Max encouragement for pt to mobilize but pt would not agree.    Weston Anna, MPT Pager: [phone]

## 2015-02-10 NOTE — Progress Notes (Signed)
CARE MANAGEMENT NOTE 02/10/2015  Patient:  Joan Hunter, Joan Hunter   Account Number:  [id number]  Date Initiated:  02/10/2015  Documentation initiated by:  Edwyna Shell  Subjective/Objective Assessment:   81 yo female admitted with COPD exacerbation from home     Action/Plan:   discharge planning   Anticipated DC Date:  02/12/2015   Anticipated DC Plan:  [place]  CM consult      [facility] Choice  HOME HEALTH   Choice offered to / List presented to:  C-1 Patient           Status of service:  In process, will continue to follow Medicare Important Message given?   (If response is "NO", the following Medicare IM given date fields will be blank) Date Medicare IM given:   Medicare IM given by:   Date Additional Medicare IM given:   Additional Medicare IM given by:    Discharge Disposition:    Per UR Regulation:    If discussed at Long Length of Stay Meetings, dates discussed:    Comments:  02/10/15 Edwyna Shell RN [facility] CM [phone] Patient lives with daughter and has a cane. She stated that she does not feel that she needs the recommended walker and she would not be able to use it in her house anyway. She stated that if she needs a walker than she will get one.Provided the patient with a [facility] list with the patient if she is willing to accept [facility] services. Will continue to follow for any discharge planning needs.

## 2015-02-11 LAB — GLUCOSE, CAPILLARY: Glucose-Capillary: 211 mg/dL — ABNORMAL HIGH (ref 70–99)

## 2015-02-11 MED ORDER — DOXYCYCLINE HYCLATE 100 MG PO TABS: 100.0000 mg | ORAL_TABLET | Freq: Two times a day (BID) | ORAL | Status: DC

## 2015-02-11 MED ORDER — DEXTROMETHORPHAN POLISTIREX 30 MG/5ML PO LQCR: 30.0000 mg | Freq: Two times a day (BID) | ORAL | Status: DC

## 2015-02-11 NOTE — Discharge Summary (Signed)
Physician Discharge Summary  Joan Hunter TDV:[id number] DOB: November 26, 1927 DOA: 02/08/2015  PCP: Kandice Hams, MD  Admit date: 02/08/2015 Discharge date: 02/11/2015   Recommendations for Outpatient Follow-Up:   1. Patient instructed to F/U with PCP if symptoms worsen, otherwise at next scheduled appointment. 2. F/U final sputum culture results.   Discharge Diagnosis:   Principal Problem:    COPD exacerbation Active Problems:    Diabetes mellitus type 2 in nonobese    Essential hypertension    PULMONARY FIBROSIS, POSTINFLAMMATORY    History of Non-small cell cancer of right lung    CAD (coronary artery disease)   Discharge Condition: Improved.  Diet recommendation:  Carbohydrate-modified.     History of Present Illness:   Joan Hunter is an 81 y.o. female with a PMH of non-small cell lung cancer on surveillance, chronic diastolic CHF, CAD, osteopenia, mitral regurgitation, diabetes, hypertension who was admitted 02/08/15 with a chief complaint of a one-week history of progressively worsening shortness of breath and cough.  Hospital Course by Problem:   Principal Problem:  COPD exacerbation / pulmonary fibrosis, postinflammatory - CT of the chest was negative for pneumonia and pulmonary embolism. Treated with empiric antibiotics given that she was febrile (Rocephin/Azithromycin), and will discharge on 4 additional days of doxycycline. - Treated with steroids, empiric antibiotics, DuoNeb's, F/U sputum culture urine antigens. - Sputum culture pending, but strep pneumoniae and legionella antigens were negative.  Active Problems:  Hypokalemia - Repleted.   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  Treated with SSI. - Hemoglobin A1c 6.6%, good outpatient control.   Essential hypertension - Continue Norvasc, Coreg and Lasix.   Non-small cell cancer of right lung - Not  currently undergoing active treatment, on surveillance.   CAD (coronary artery disease) - Stable.   Medical Consultants:    None.   Discharge Exam:   Filed Vitals:   02/11/15 0539  BP: 151/79  Pulse: 69  Temp: 98.4 F (36.9 C)  Resp: 18   Filed Vitals:   02/11/15 0539 02/11/15 0737 02/11/15 0831 02/11/15 0910  BP: 151/79     Pulse: 69     Temp: 98.4 F (36.9 C)     TempSrc: Oral     Resp: 18     Weight:  64.8 kg (142 lb 13.7 oz) 65.6 kg (144 lb 10 oz)   SpO2: 98%   95%    Gen:  NAD Cardiovascular:  RRR, No M/R/G Respiratory: Lungs CTAB, no wheeze Gastrointestinal: Abdomen soft, NT/ND with normal active bowel sounds. Extremities: No C/E/C   The results of significant diagnostics from this hospitalization (including imaging, microbiology, ancillary and laboratory) are listed below for reference.     Procedures and Diagnostic Studies:   Dg Chest 2 View  02/09/2015   CLINICAL DATA:  Cough for 24 hr. History of CHF. Shortness of breath for several hr.  EXAM: CHEST  2 VIEW  COMPARISON:  CT chest 12/20/2014.  Chest 09/01/2013.  FINDINGS: Technically limited study due to motion artifact. Mild cardiac enlargement without vascular congestion. Linear atelectasis or scarring in the right mid lung. No blunting of costophrenic angles. No pneumothorax. Tortuous aorta. Esophageal hiatal hernia behind the heart.  IMPRESSION: Cardiac enlargement. Linear atelectasis or scarring in the right mid lung. Esophageal hiatal hernia behind the heart. No active consolidation.   Electronically Signed   By: Lucienne Capers M.D.   On: 02/09/2015 00:03   Ct Angio Chest  Pe W/cm &/or Wo Cm  02/09/2015   CLINICAL DATA:  81 year old female with cough and shortness of breath. History of right-sided lung cancer post radiation.  EXAM: CT ANGIOGRAPHY CHEST WITH CONTRAST  TECHNIQUE: Multidetector CT imaging of the chest was performed using the standard protocol during bolus administration of intravenous  contrast. Multiplanar CT image reconstructions and MIPs were obtained to evaluate the vascular anatomy.  CONTRAST:  168mL OMNIPAQUE IOHEXOL 350 MG/ML SOLN  COMPARISON:  Chest CT 12/20/2014  FINDINGS: There are no filling defects within the pulmonary arteries to suggest pulmonary embolus.  Radiation change in the right perihilar lung, similar to prior exam. 11 mm subpleural nodule in the right costophrenic sulcus, unchanged. Multiple small subcentimeter nodules in the right middle and upper lobes are also unchanged. Mild emphysematous change, stable from prior. There is no consolidation. Stable perifissural nodules in the left lung.  There is no pleural or pericardial effusion. There is a prominent subcarinal lymph node measuring 17 mm in short axis dimension, this appears increased in size from prior exam. There is otherwise no mediastinal or left hilar adenopathy. The thoracic aorta is normal in caliber with mild atherosclerosis. Heart size is normal, coronary artery calcifications are seen.  Moderate hiatal hernia. No acute abnormality in the included upper abdomen.  New from prior exam, mild anterior compression deformity of T5 vertebral body, vertebral body hemangioma anteriorly is less well-defined than on prior exam. There is mild sclerosis in the posterior aspect of the vertebral body. There is sclerosis involving the posterior aspect of the T6 vertebral body with minimal compression deformity of the superior endplate.  Review of the MIP images confirms the above findings.  IMPRESSION: 1. No pulmonary embolus. 2. Prominent subcarinal lymph node that is increased in size from prior exam. While this may be reactive, given history lung malignancy, nodal metastasis is not excluded. 3. New compression deformities of T5 and T6 vertebral body is with scleroses posteriorly. This scleroses is new from prior exam. Given development over the course of less than 2 months, osteoporotic compression fractures are favored,  less likely blastic metastatic disease. This could be further assessed with MRI based on clinical concern. 4. Stable right lower lobe pulmonary nodule on posttreatment related change of the right perihilar lung.   Electronically Signed   By: Jeb Levering M.D.   On: 02/09/2015 02:06     Labs:   Basic Metabolic Panel:  Recent Labs Lab 02/08/15 2348 02/09/15 0754 02/10/15 0611  NA 143 139 141  K 3.1* 3.0* 3.9  CL 104 103 104  CO2 29 26 26   GLUCOSE 170* 194* 185*  BUN 14 10 23   CREATININE 0.88 0.84 0.83  CALCIUM 9.2 8.8 8.8   GFR Estimated Creatinine Clearance: 47.3 mL/min (by C-G formula based on Cr of 0.83). Liver Function Tests:  Recent Labs Lab 02/09/15 0754  AST 29  ALT 17  ALKPHOS 97  BILITOT 0.6  PROT 7.9  ALBUMIN 3.7   Coagulation profile  Recent Labs Lab 02/09/15 0754  INR 1.14    CBC:  Recent Labs Lab 02/08/15 2348 02/09/15 0754 02/10/15 0611  WBC 9.9 10.5 8.8  NEUTROABS 8.1* 9.7*  --   HGB 13.3 12.8 11.2*  HCT 41.2 37.1 34.6*  MCV 89.6 89.2 89.4  PLT 211 163 167   CBG:  Recent Labs Lab 02/10/15 0806 02/10/15 1222 02/10/15 1734 02/10/15 2114 02/11/15 0744  GLUCAP 188* 199* 147* 185* 211*   Hgb A1c  Recent Labs  02/09/15  0754  HGBA1C 6.6*   Thyroid function studies  Recent Labs  02/09/15 0247  TSH 0.724   Microbiology Recent Results (from the past 240 hour(s))  Culture, sputum-assessment     Status: None   Collection Time: 02/09/15 10:28 AM  Result Value Ref Range Status   Specimen Description SPUTUM  Final   Special Requests NONE  Final   Sputum evaluation   Final    THIS SPECIMEN IS ACCEPTABLE. RESPIRATORY CULTURE REPORT TO FOLLOW.   Report Status 02/09/2015 FINAL  Final  Culture, respiratory (NON-Expectorated)     Status: None (Preliminary result)   Collection Time: 02/09/15 10:28 AM  Result Value Ref Range Status   Specimen Description SPUTUM  Final   Special Requests NONE  Final   Gram Stain   Final     ABUNDANT WBC PRESENT, PREDOMINANTLY PMN MODERATE SQUAMOUS EPITHELIAL CELLS PRESENT MODERATE GRAM POSITIVE COCCI IN PAIRS IN CHAINS IN CLUSTERS Performed at [facility]    Culture   Final    NORMAL OROPHARYNGEAL FLORA Performed at [facility]    Report Status PENDING  Incomplete     Discharge Instructions:   Discharge Instructions    Call MD for:  extreme fatigue    Complete by:  As directed      Call MD for:  temperature >100.4    Complete by:  As directed      Diet Carb Modified    Complete by:  As directed      Discharge instructions    Complete by:  As directed   You were cared for by Dr. Jacquelynn Cree  (a hospitalist) during your hospital stay. If you have any questions about your discharge medications or the care you received while you were in the hospital after you are discharged, you can call the unit and ask to speak with the hospitalist on call if the hospitalist that took care of you is not available. Once you are discharged, your primary care physician will handle any further medical issues. Please note that NO REFILLS for any discharge medications will be authorized once you are discharged, as it is imperative that you return to your primary care physician (or establish a relationship with a primary care physician if you do not have one) for your aftercare needs so that they can reassess your need for medications and monitor your lab values.  Any outstanding tests can be reviewed by your PCP at your follow up visit.  It is also important to review any medicine changes with your PCP.  Please bring these d/c instructions with you to your next visit so your physician can review these changes with you.  If you do not have a primary care physician, you can call [phone] for a physician referral.  It is highly recommended that you obtain a PCP for hospital follow up.     Increase activity slowly    Complete by:  As directed             Medication List      TAKE these medications        albuterol 108 (90 BASE) MCG/ACT inhaler  Commonly known as:  PROVENTIL HFA;VENTOLIN HFA  Inhale 2 puffs into the lungs every 6 (six) hours as needed for wheezing or shortness of breath.     amLODipine 5 MG tablet  Commonly known as:  NORVASC  Take 5 mg by mouth daily.     aspirin 81 MG tablet  Take 81  mg by mouth daily.     atorvastatin 40 MG tablet  Commonly known as:  LIPITOR  Take 40 mg by mouth daily.     carvedilol 12.5 MG tablet  Commonly known as:  COREG  Take 1 tablet (12.5 mg total) by mouth 2 (two) times daily with a meal.     dextromethorphan 30 MG/5ML liquid  Commonly known as:  DELSYM  Take 5 mLs (30 mg total) by mouth 2 (two) times daily.     doxycycline 100 MG tablet  Commonly known as:  VIBRA-TABS  Take 1 tablet (100 mg total) by mouth 2 (two) times daily.     gabapentin 100 MG capsule  Commonly known as:  NEURONTIN  Take 100 mg by mouth 3 (three) times daily as needed. Pain     glipiZIDE 5 MG tablet  Commonly known as:  GLUCOTROL  Take 2.5 mg by mouth daily.     LASIX 20 MG tablet  Generic drug:  furosemide  Take 40 mg by mouth daily. Take 1 tab every other day     traMADol 50 MG tablet  Commonly known as:  ULTRAM  Take 50 mg by mouth every 12 (twelve) hours as needed for moderate pain.           Follow-up Information    Follow up with Kandice Hams, MD.   Specialty:  Internal Medicine   Why:  If symptoms worsen   Contact information:   [address] [place] [address] [phone]        Time coordinating discharge: 35 minutes.  Signed:  RAMA,CHRISTINA  Pager [phone] [facility] 02/11/2015, 9:18 AM

## 2015-02-11 NOTE — Discharge Instructions (Signed)
Cough, Adult  A cough is a reflex that helps clear your throat and airways. It can help heal the body or may be a reaction to an irritated airway. A cough may only last 2 or 3 weeks (acute) or may last more than 8 weeks (chronic).  CAUSES Acute cough:  Viral or bacterial infections. Chronic cough:  Infections.  Allergies.  Asthma.  Post-nasal drip.  Smoking.  Heartburn or acid reflux.  Some medicines.  Chronic lung problems (COPD).  Cancer. SYMPTOMS   Cough.  Fever.  Chest pain.  Increased breathing rate.  High-pitched whistling sound when breathing (wheezing).  Colored mucus that you cough up (sputum). TREATMENT   A bacterial cough may be treated with antibiotic medicine.  A viral cough must run its course and will not respond to antibiotics.  Your caregiver may recommend other treatments if you have a chronic cough. HOME CARE INSTRUCTIONS   Only take over-the-counter or prescription medicines for pain, discomfort, or fever as directed by your caregiver. Use cough suppressants only as directed by your caregiver.  Use a cold steam vaporizer or humidifier in your bedroom or home to help loosen secretions.  Sleep in a semi-upright position if your cough is worse at night.  Rest as needed.  Stop smoking if you smoke. SEEK IMMEDIATE MEDICAL CARE IF:   You have pus in your sputum.  Your cough starts to worsen.  You cannot control your cough with suppressants and are losing sleep.  You begin coughing up blood.  You have difficulty breathing.  You develop pain which is getting worse or is uncontrolled with medicine.  You have a fever. MAKE SURE YOU:   Understand these instructions.  Will watch your condition.  Will get help right away if you are not doing well or get worse. Document Released: 05/31/2011 Document Revised: 02/24/2012 Document Reviewed: 05/31/2011 Exit[employer] Patient Information 2015 [employer]. This information is not intended  to replace advice given to you by your health care provider. Make sure you discuss any questions you have with your health care provider.  

## 2015-02-11 NOTE — Progress Notes (Signed)
Patient was discharged home with instructions given on medications,and follow up visits,patient and family verbalized understanding. Prescriptions sent with patient. Staff accompanied patient to vehicle.

## 2015-02-12 LAB — CULTURE, RESPIRATORY W GRAM STAIN: Culture: NORMAL

## 2015-02-12 LAB — CULTURE, RESPIRATORY

## 2015-03-20 ENCOUNTER — Other Ambulatory Visit: Payer: Self-pay | Admitting: *Deleted

## 2015-03-20 ENCOUNTER — Encounter: Payer: Self-pay | Admitting: Cardiology

## 2015-03-20 ENCOUNTER — Ambulatory Visit (INDEPENDENT_AMBULATORY_CARE_PROVIDER_SITE_OTHER): Payer: Medicare Other | Admitting: Cardiology

## 2015-03-20 VITALS — BP 126/78 | HR 88 | Ht 67.0 in | Wt 146.0 lb

## 2015-03-20 DIAGNOSIS — J8410 Pulmonary fibrosis, unspecified: Secondary | ICD-10-CM

## 2015-03-20 DIAGNOSIS — I2510 Atherosclerotic heart disease of native coronary artery without angina pectoris: Secondary | ICD-10-CM | POA: Diagnosis not present

## 2015-03-20 DIAGNOSIS — C3491 Malignant neoplasm of unspecified part of right bronchus or lung: Secondary | ICD-10-CM

## 2015-03-20 DIAGNOSIS — I2583 Coronary atherosclerosis due to lipid rich plaque: Secondary | ICD-10-CM

## 2015-03-20 DIAGNOSIS — I444 Left anterior fascicular block: Secondary | ICD-10-CM | POA: Diagnosis not present

## 2015-03-20 MED ORDER — FUROSEMIDE 40 MG PO TABS: 40.0000 mg | ORAL_TABLET | ORAL | Status: DC

## 2015-03-20 NOTE — Patient Instructions (Signed)
The current medical regimen is effective;  continue present plan and medications.  Follow up in 4 months with Dr. Marlou Porch.  You will receive a letter in the mail 2 months before you are due.  Please call us when you receive this letter to schedule your follow up appointment.  Thank you for choosing [facility]!!

## 2015-03-20 NOTE — Progress Notes (Signed)
[facility]. [address] [place], [place]  [address] Phone: [phone] Fax:  [phone]  Date:  03/20/2015   ID:  Joan Hunter, DOB 08-08-1927, MRN [id number]  PCP:  Kandice Hams, MD   History of Present Illness: Joan Hunter is a 81 y.o. female with moderate coronary artery disease prior catheterization in 2009, prior idiopathic cardiomyopathy (EF was 15%) now resolved, mitral regurgitation mild here for followup. 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 Dr. Tammi Klippel. Radiation only. Seeing again in 1/16.  Went to hospital with cough. COPD/bronchitis. CT scan did not show any evidence of pneumonia. 2/16 - azith. Prednisone shot by Dr. [name]. Cough now resolved.   Continuing with atorvastatin, hyperlipidemia. Mild shortness of breath with activity. Encouraged activity.  She is trying to do more walking to improve her circulation. She's not having any chest discomfort.   She experienced some dizziness when standing and her medications were reduced.   Her daughter helps her out significantly but she also make sure that she gets up and moves around adequately.    Wt Readings from Last 3 Encounters:  03/20/15 146 lb (66.225 kg)  02/11/15 144 lb 10 oz (65.6 kg)  12/27/14 144 lb 4.8 oz (65.454 kg)     Past Medical History  Diagnosis Date  . CHF (congestive heart failure)   . Hyperlipidemia   . Hypertension   . Arthritis   . Dilated cardiomyopathy     now resolved, EF 60-65% echo 10/11/11 (Dr. Marlou Porch)  . CAD (coronary artery disease)     mod 2V CAD (60% LAD, 70-80% mid CX) by 01/2008 cath  . Lung mass   . Allergic rhinitis   . Diverticulosis   . Osteopenia   . MR (mitral regurgitation)   . Diabetes mellitus     dx 10 plus yrs ago    Past Surgical History  Procedure Laterality Date  . Abdominal hysterectomy    . Appendectomy    . Knee arthroscopy      ?  left   knee  . Eye surgery      bil cataracts  . Tonsillectomy      Current Outpatient Prescriptions  Medication Sig Dispense Refill  . amLODipine (NORVASC) 5 MG tablet Take 5 mg by mouth daily.    Marland Kitchen aspirin 81 MG tablet Take 81 mg by mouth daily.    Marland Kitchen atorvastatin (LIPITOR) 40 MG tablet Take 40 mg by mouth daily.    . carvedilol (COREG) 12.5 MG tablet Take 1 tablet (12.5 mg total) by mouth 2 (two) times daily with a meal. 60 tablet 2  . furosemide (LASIX) 40 MG tablet Take 40 mg by mouth daily.     Marland Kitchen gabapentin (NEURONTIN) 100 MG capsule Take 100 mg by mouth 3 (three) times daily as needed. Pain    . glipiZIDE (GLUCOTROL) 5 MG tablet Take 2.5 mg by mouth daily.     No current facility-administered medications for this visit.    Allergies:    Allergies  Allergen Reactions  . Moxifloxacin     REACTION: hives  . Penicillins     REACTION: hives  . Prednisone     REACTION: hives    Social History:  The patient  reports that she quit smoking about 42 years ago. Her smoking use included Cigarettes. She smoked 0.50 packs per day. She does not have any smokeless tobacco  history on file. She reports that she does not drink alcohol or use illicit drugs.   ROS:  Please see the history of present illness. Positive for cough, shortness of breath with activity, back pain, easy bruising, snoring, wheezing, balance, fatigue Denies any syncope, bleeding, orthopnea, PND   PHYSICAL EXAM: VS:  BP 126/78 mmHg  Pulse 88  Ht 5\' 7"  (1.702 m)  Wt 146 lb (66.225 kg)  BMI 22.86 kg/m2 Well nourished, well developed, in no acute distress, walks with cane, slowed down. HEENT: normal Neck: no JVD Cardiac:  normal S1, S2; RRR; no obvious murmur Lungs:  clear to auscultation bilaterally, no wheezing, rhonchi or rales Abd: soft, nontender, no hepatomegaly Ext: no edema Skin: warm and dry Neuro: no focal abnormalities noted  Creatinine 0.7 on 10/14, liver functions normal.  EKG:  12/02/13-will rhythm,  sinus arrhythmia, left axis deviation/left anterior fascicular block, LVH, nonspecific ST changes, heart rate 73    ECHO 2012:   1. Normal LV size and function.  2. There were no regional wall motion abnormalities.  3. Left ventricular ejection fraction estimated by 2D at 60-65 percent.  4. Mild mitral valve regurgitation.  5. Trivial tricuspid regurgitation.  ASSESSMENT AND PLAN:  1. Left ventricular systolic dysfunction-now resolved, doing well. Continue to encourage activity. This should help with her fatigue. 2. Non-small cell cancer of right lung, stage IA-Dr. Tammi Klippel, Dr. Joya Gaskins. Currently finished with radiation. Formally known as BAC. Dr. Tammi Klippel 3. Pulmonary fibrosis-Dr. Joya Gaskins. Stable.posthospitalization September 2016 with inflammatory state.  4. Mitral regurgitation-previously characterized as severe but now mild. Doing well. 5. Hypertension-currently reasonable controlled. Off of hydralazine and amlodipine.Off lisinopril now, on losartan.  Decreased coreg because of dizziness. Symptoms of orthostatic hypotension. She is doing better with decrease in medication.  6. Edema - QOD lasix, Good looking ankles. Gave her refill 7. Left anterior fascicular block-chronic, unchanged, no syncope 8. 4 month follow up. (Daughter requests)   Signed, Candee Furbish, MD [facility]  03/20/2015 8:38 AM

## 2015-05-01 ENCOUNTER — Other Ambulatory Visit: Payer: Self-pay | Admitting: Internal Medicine

## 2015-05-01 ENCOUNTER — Emergency Department (HOSPITAL_COMMUNITY)
Admission: EM | Admit: 2015-05-01 | Discharge: 2015-05-01 | Disposition: A | Discharge status: Home / Self Care | Payer: Medicare Other | Attending: Emergency Medicine | Admitting: Emergency Medicine

## 2015-05-01 ENCOUNTER — Ambulatory Visit
Admission: RE | Admit: 2015-05-01 | Discharge: 2015-05-01 | Disposition: A | Discharge status: Home / Self Care | Payer: Medicare Other | Source: Ambulatory Visit | Attending: Internal Medicine | Admitting: Internal Medicine

## 2015-05-01 ENCOUNTER — Emergency Department (HOSPITAL_COMMUNITY): Payer: Medicare Other

## 2015-05-01 ENCOUNTER — Encounter (HOSPITAL_COMMUNITY): Payer: Self-pay | Admitting: Emergency Medicine

## 2015-05-01 DIAGNOSIS — I509 Heart failure, unspecified: Secondary | ICD-10-CM | POA: Insufficient documentation

## 2015-05-01 DIAGNOSIS — R0600 Dyspnea, unspecified: Secondary | ICD-10-CM

## 2015-05-01 DIAGNOSIS — I2510 Atherosclerotic heart disease of native coronary artery without angina pectoris: Secondary | ICD-10-CM | POA: Insufficient documentation

## 2015-05-01 DIAGNOSIS — R000 Tachycardia, unspecified: Secondary | ICD-10-CM | POA: Insufficient documentation

## 2015-05-01 DIAGNOSIS — Z87891 Personal history of nicotine dependence: Secondary | ICD-10-CM | POA: Insufficient documentation

## 2015-05-01 DIAGNOSIS — E119 Type 2 diabetes mellitus without complications: Secondary | ICD-10-CM | POA: Insufficient documentation

## 2015-05-01 DIAGNOSIS — E785 Hyperlipidemia, unspecified: Secondary | ICD-10-CM | POA: Insufficient documentation

## 2015-05-01 DIAGNOSIS — Z8719 Personal history of other diseases of the digestive system: Secondary | ICD-10-CM | POA: Insufficient documentation

## 2015-05-01 DIAGNOSIS — Z7982 Long term (current) use of aspirin: Secondary | ICD-10-CM | POA: Insufficient documentation

## 2015-05-01 DIAGNOSIS — J209 Acute bronchitis, unspecified: Secondary | ICD-10-CM | POA: Diagnosis not present

## 2015-05-01 DIAGNOSIS — I10 Essential (primary) hypertension: Secondary | ICD-10-CM | POA: Diagnosis not present

## 2015-05-01 DIAGNOSIS — Z880 Allergy status to penicillin: Secondary | ICD-10-CM | POA: Insufficient documentation

## 2015-05-01 DIAGNOSIS — Z79899 Other long term (current) drug therapy: Secondary | ICD-10-CM | POA: Insufficient documentation

## 2015-05-01 DIAGNOSIS — J40 Bronchitis, not specified as acute or chronic: Secondary | ICD-10-CM

## 2015-05-01 DIAGNOSIS — M1990 Unspecified osteoarthritis, unspecified site: Secondary | ICD-10-CM | POA: Diagnosis not present

## 2015-05-01 DIAGNOSIS — R7989 Other specified abnormal findings of blood chemistry: Secondary | ICD-10-CM | POA: Diagnosis present

## 2015-05-01 LAB — I-STAT CHEM 8, ED
BUN: 15 mg/dL (ref 6–20)
CALCIUM ION: 1.1 mmol/L — AB (ref 1.13–1.30)
CREATININE: 1 mg/dL (ref 0.44–1.00)
Chloride: 98 mmol/L — ABNORMAL LOW (ref 101–111)
GLUCOSE: 289 mg/dL — AB (ref 65–99)
HEMATOCRIT: 38 % (ref 36.0–46.0)
HEMOGLOBIN: 12.9 g/dL (ref 12.0–15.0)
Potassium: 2.9 mmol/L — ABNORMAL LOW (ref 3.5–5.1)
Sodium: 142 mmol/L (ref 135–145)
TCO2: 26 mmol/L (ref 0–100)

## 2015-05-01 LAB — BRAIN NATRIURETIC PEPTIDE: B NATRIURETIC PEPTIDE 5: 34.6 pg/mL (ref 0.0–100.0)

## 2015-05-01 LAB — CBC WITH DIFFERENTIAL/PLATELET
BASOS ABS: 0 10*3/uL (ref 0.0–0.1)
BASOS PCT: 0 % (ref 0–1)
EOS PCT: 3 % (ref 0–5)
Eosinophils Absolute: 0.3 10*3/uL (ref 0.0–0.7)
HCT: 35.9 % — ABNORMAL LOW (ref 36.0–46.0)
HEMOGLOBIN: 11.3 g/dL — AB (ref 12.0–15.0)
Lymphocytes Relative: 16 % (ref 12–46)
Lymphs Abs: 1.4 10*3/uL (ref 0.7–4.0)
MCH: 28 pg (ref 26.0–34.0)
MCHC: 31.5 g/dL (ref 30.0–36.0)
MCV: 88.9 fL (ref 78.0–100.0)
Monocytes Absolute: 0.8 10*3/uL (ref 0.1–1.0)
Monocytes Relative: 10 % (ref 3–12)
Neutro Abs: 6 10*3/uL (ref 1.7–7.7)
Neutrophils Relative %: 71 % (ref 43–77)
PLATELETS: 221 10*3/uL (ref 150–400)
RBC: 4.04 MIL/uL (ref 3.87–5.11)
RDW: 14.9 % (ref 11.5–15.5)
WBC: 8.5 10*3/uL (ref 4.0–10.5)

## 2015-05-01 LAB — TROPONIN I

## 2015-05-01 MED ORDER — DOXYCYCLINE HYCLATE 100 MG PO CAPS: 100.0000 mg | ORAL_CAPSULE | Freq: Two times a day (BID) | ORAL | Status: DC

## 2015-05-01 MED ORDER — AZITHROMYCIN 250 MG PO TABS
500.0000 mg | ORAL_TABLET | Freq: Once | ORAL | Status: AC
  Administered 2015-05-01: 500 mg via ORAL
  Filled 2015-05-01: qty 2

## 2015-05-01 MED ORDER — BENZONATATE 100 MG PO CAPS: 100.0000 mg | ORAL_CAPSULE | Freq: Three times a day (TID) | ORAL | Status: DC | PRN

## 2015-05-01 MED ORDER — IOHEXOL 350 MG/ML SOLN
100.0000 mL | Freq: Once | INTRAVENOUS | Status: AC | PRN
  Administered 2015-05-01: 100 mL via INTRAVENOUS

## 2015-05-01 MED ORDER — POTASSIUM CHLORIDE CRYS ER 20 MEQ PO TBCR
40.0000 meq | EXTENDED_RELEASE_TABLET | Freq: Once | ORAL | Status: AC
  Administered 2015-05-01: 40 meq via ORAL
  Filled 2015-05-01: qty 2

## 2015-05-01 MED ORDER — IPRATROPIUM-ALBUTEROL 0.5-2.5 (3) MG/3ML IN SOLN
3.0000 mL | Freq: Once | RESPIRATORY_TRACT | Status: AC
  Administered 2015-05-01: 3 mL via RESPIRATORY_TRACT
  Filled 2015-05-01: qty 3

## 2015-05-01 NOTE — ED Provider Notes (Signed)
CSN: [id number]     Arrival date & time 05/01/15  1745 History   First MD Initiated Contact with Patient 05/01/15 1823     Chief Complaint  Patient presents with  . sent for elevated D-Dimer     (Consider location/radiation/quality/duration/timing/severity/associated sxs/prior Treatment) HPI   Pt with hx COPD, CAP, CHF p/w dry cough and wheezing for 1-2 weeks, occasional right sided chest pain related to cough.  Increased SOB not unable to walk 10 ft without wheezing and increased work of breathing.  Denies fevers, chills, myalgias, leg swelling.  She is sedentary but has no recent immobilization.  No hx blood clots.  She was sent from her PCP Dr [name] with elevated d-dimer 3.5.  There are currently no lab results or note available in EPIC.  CXR is available, no acute changes.    Past Medical History  Diagnosis Date  . CHF (congestive heart failure)   . Hyperlipidemia   . Hypertension   . Arthritis   . Dilated cardiomyopathy     now resolved, EF 60-65% echo 10/11/11 (Dr. Marlou Porch)  . CAD (coronary artery disease)     mod 2V CAD (60% LAD, 70-80% mid CX) by 01/2008 cath  . Lung mass   . Allergic rhinitis   . Diverticulosis   . Osteopenia   . MR (mitral regurgitation)   . Diabetes mellitus     dx 10 plus yrs ago   Past Surgical History  Procedure Laterality Date  . Abdominal hysterectomy    . Appendectomy    . Knee arthroscopy      ?  left  knee  . Eye surgery      bil cataracts  . Tonsillectomy     Family History  Problem Relation Age of Onset  . Colon cancer Mother   . Heart disease Brother    History  Substance Use Topics  . Smoking status: Former Smoker -- 0.50 packs/day    Types: Cigarettes    Quit date: 06/15/1972  . Smokeless tobacco: Not on file  . Alcohol Use: No   OB History    No data available     Review of Systems  All other systems reviewed and are negative.     Allergies  Moxifloxacin; Penicillins; and Prednisone  Home Medications    Prior to Admission medications   Medication Sig Start Date End Date Taking? Authorizing Provider  amLODipine (NORVASC) 5 MG tablet Take 5 mg by mouth daily.   Yes Historical Provider, MD  aspirin 81 MG tablet Take 81 mg by mouth daily.   Yes Historical Provider, MD  atorvastatin (LIPITOR) 40 MG tablet Take 40 mg by mouth daily.   Yes Historical Provider, MD  carvedilol (COREG) 12.5 MG tablet Take 1 tablet (12.5 mg total) by mouth 2 (two) times daily with a meal. 10/12/14  Yes Jerline Pain, MD  furosemide (LASIX) 40 MG tablet Take 1 tablet (40 mg total) by mouth every other day. 03/20/15  Yes Jerline Pain, MD  gabapentin (NEURONTIN) 100 MG capsule Take 100 mg by mouth 3 (three) times daily as needed. Pain   Yes Historical Provider, MD  glipiZIDE (GLUCOTROL) 5 MG tablet Take 2.5 mg by mouth daily.   Yes Historical Provider, MD   BP 137/87 mmHg  Pulse 118  Temp(Src) 98 F (36.7 C) (Oral)  Resp 19  Ht '5\' 7"'$  (1.702 m)  Wt 146 lb (66.225 kg)  BMI 22.86 kg/m2  SpO2 97% Physical Exam  Constitutional: She  appears well-developed and well-nourished. No distress.  HENT:  Head: Normocephalic and atraumatic.  Neck: Neck supple.  Cardiovascular: Regular rhythm.  Tachycardia present.   Pulmonary/Chest: Effort normal. No respiratory distress. She has wheezes. She has no rales.  Abdominal: Soft. She exhibits no distension. There is no tenderness. There is no rebound and no guarding.  Musculoskeletal: She exhibits no edema.  Neurological: She is alert.  Skin: She is not diaphoretic.  Nursing note and vitals reviewed.   ED Course  Procedures (including critical care time) Labs Review Labs Reviewed  CBC WITH DIFFERENTIAL/PLATELET - Abnormal; Notable for the following:    Hemoglobin 11.3 (*)    HCT 35.9 (*)    All other components within normal limits  I-STAT CHEM 8, ED - Abnormal; Notable for the following:    Potassium 2.9 (*)    Chloride 98 (*)    Glucose, Bld 289 (*)    Calcium, Ion  1.10 (*)    All other components within normal limits  BRAIN NATRIURETIC PEPTIDE  TROPONIN I    Imaging Review Dg Chest 2 View  05/01/2015   CLINICAL DATA:  Dyspnea and dry cough  EXAM: CHEST  2 VIEW  COMPARISON:  02/08/2015  FINDINGS: Cardiac shadow is stable. A small hiatal hernia is again seen. Scarring is noted in the right upper lobe. No new focal infiltrate or sizable effusion is noted.  IMPRESSION: Chronic changes without acute abnormality.   Electronically Signed   By: Inez Catalina M.D.   On: 05/01/2015 14:24   Ct Angio Chest Pe W/cm &/or Wo Cm  05/01/2015   CLINICAL DATA:  81 year old female with wheezing, coughing and elevated D-dimer. Medical history includes right lower lobe non-small cell lung cancer status post curative stereotactic radiotherapy completed in November 2014.  EXAM: CT ANGIOGRAPHY CHEST WITH CONTRAST  TECHNIQUE: Multidetector CT imaging of the chest was performed using the standard protocol during bolus administration of intravenous contrast. Multiplanar CT image reconstructions and MIPs were obtained to evaluate the vascular anatomy.  CONTRAST:  146m OMNIPAQUE IOHEXOL 350 MG/ML SOLN  COMPARISON:  Most recent prior CT PE study 02/09/2015  FINDINGS: Mediastinum: Sliding hiatal hernia similar compared to prior. Unchanged size of mediastinal lymph nodes. No new or enlarging adenopathy.  Heart/Vascular: Adequate opacification of the pulmonary arteries to the segmental level. No evidence of filling defect to suggest acute pulmonary embolus. Mild ectasia of the thoracic aorta without aneurysmal dilatation. Borderline cardiomegaly similar compared to prior. Trace pericardial effusion also similar compared to prior. Atherosclerotic calcifications throughout the coronary arteries.  Lungs/Pleura: No pleural effusion. Stable post radiation fibrosis and scarring in the superior segment of the right lower lobe extending into the hilum and along the minor fissure. Numerous scattered  pulmonary nodules throughout both lungs remain unchanged in size, number and configuration including the largest 11 mm nodule in the periphery of the right lower lobe. No focal airspace consolidation, pulmonary edema or pneumothorax. Mild bronchial wall thickening.  Bones/Soft Tissues: No acute osseous abnormality.  Upper Abdomen: Visualized upper abdominal organs are unremarkable.  Review of the MIP images confirms the above findings.  IMPRESSION: 1. Negative for acute pulmonary embolus, pneumonia or other acute cardiopulmonary process. 2. Stable chronic radiation fibrosis and architectural distortion in the superior segment of the right lower lobe, right hilum and along the right minor fissure. 3. Continue stability of the numerous bilateral pulmonary nodules the largest of which measures 11 mm in the periphery of the right lower lobe. 4. Large sliding hiatal  hernia. 5. Atherosclerosis including coronary artery disease. 6. Stable small pericardial effusion. 7. Stable chronic bronchial wall thickening consistent with chronic bronchitis.   Electronically Signed   By: Jacqulynn Cadet M.D.   On: 05/01/2015 20:12     EKG Interpretation None       Hyperglycemia, pt did have injection today, suspect it was steroid injection for COPD exacerbation.   8:37 PM Pt reports she is feeling better.  Moving air well in all fields, residual wheeze in left lower lobe.  Plan for ambulation with pulsox.    O2 sat reported 94% while ambulating.  Pt reports she felt well without significant SOB while ambulating, ready for d/c home.  MDM   Final diagnoses:  Bronchitis    Afebrile nontoxic patient with hx COPD, CAD, CHF with cough, wheezing, increased SOB x 1-2 weeks. Sent by PCP with elevated D-dimer.   CT angio chest negative.  Doubt cardiac etiology.  Improved with nebs and likely with steroid injection given by PCP.  Ambulating without SOB after treatments.  D/C home with doxycycline, tessalon perles.  Pt  allergic to moxifloxacin, penicillin, prednisone.  Discussed result, findings, treatment, and follow up  with patient.  Pt given return precautions.  Pt verbalizes understanding and agrees with plan.       Clayton Bibles, PA-C 05/01/15 [place], MD 05/02/15 [phone]

## 2015-05-01 NOTE — ED Notes (Signed)
Pt went to PCP today for wheezing and coughing. While there pt had blood work done, given breathing treatment and given a shot by DR [employer].  Pt was called and told to come to ED for scan of chest bc D-Dimer is elevated 3.51.

## 2015-05-01 NOTE — ED Notes (Signed)
Patient ambulate without difficulty down hallway and around nursing station. Patient's 02 Sat was 94-95% throughout.

## 2015-05-01 NOTE — Discharge Instructions (Signed)
Read the information below.  Use the prescribed medication as directed.  Please discuss all new medications with your pharmacist.  You may return to the Emergency Department at any time for worsening condition or any new symptoms that concern you.    °

## 2015-06-21 ENCOUNTER — Encounter: Payer: Self-pay | Admitting: Gastroenterology

## 2015-06-27 ENCOUNTER — Ambulatory Visit (HOSPITAL_COMMUNITY)
Admission: RE | Admit: 2015-06-27 | Discharge: 2015-06-27 | Disposition: A | Discharge status: Home / Self Care | Payer: Medicare Other | Source: Ambulatory Visit | Attending: Internal Medicine | Admitting: Internal Medicine

## 2015-06-27 ENCOUNTER — Other Ambulatory Visit (HOSPITAL_BASED_OUTPATIENT_CLINIC_OR_DEPARTMENT_OTHER): Payer: Medicare Other

## 2015-06-27 DIAGNOSIS — I313 Pericardial effusion (noninflammatory): Secondary | ICD-10-CM | POA: Insufficient documentation

## 2015-06-27 DIAGNOSIS — C3491 Malignant neoplasm of unspecified part of right bronchus or lung: Secondary | ICD-10-CM

## 2015-06-27 DIAGNOSIS — I2510 Atherosclerotic heart disease of native coronary artery without angina pectoris: Secondary | ICD-10-CM | POA: Diagnosis not present

## 2015-06-27 DIAGNOSIS — Z08 Encounter for follow-up examination after completed treatment for malignant neoplasm: Secondary | ICD-10-CM | POA: Insufficient documentation

## 2015-06-27 DIAGNOSIS — J439 Emphysema, unspecified: Secondary | ICD-10-CM | POA: Insufficient documentation

## 2015-06-27 DIAGNOSIS — C3431 Malignant neoplasm of lower lobe, right bronchus or lung: Secondary | ICD-10-CM | POA: Diagnosis not present

## 2015-06-27 DIAGNOSIS — Z923 Personal history of irradiation: Secondary | ICD-10-CM | POA: Diagnosis not present

## 2015-06-27 LAB — CBC WITH DIFFERENTIAL/PLATELET
BASO%: 0.4 % (ref 0.0–2.0)
BASOS ABS: 0 10*3/uL (ref 0.0–0.1)
EOS%: 4.8 % (ref 0.0–7.0)
Eosinophils Absolute: 0.4 10*3/uL (ref 0.0–0.5)
HCT: 35.7 % (ref 34.8–46.6)
HEMOGLOBIN: 11.8 g/dL (ref 11.6–15.9)
LYMPH%: 13.4 % — AB (ref 14.0–49.7)
MCH: 28.7 pg (ref 25.1–34.0)
MCHC: 33 g/dL (ref 31.5–36.0)
MCV: 86.9 fL (ref 79.5–101.0)
MONO#: 0.7 10*3/uL (ref 0.1–0.9)
MONO%: 8.3 % (ref 0.0–14.0)
NEUT#: 6.4 10*3/uL (ref 1.5–6.5)
NEUT%: 73.1 % (ref 38.4–76.8)
PLATELETS: 211 10*3/uL (ref 145–400)
RBC: 4.11 10*6/uL (ref 3.70–5.45)
RDW: 15.4 % — ABNORMAL HIGH (ref 11.2–14.5)
WBC: 8.8 10*3/uL (ref 3.9–10.3)
lymph#: 1.2 10*3/uL (ref 0.9–3.3)

## 2015-06-27 LAB — COMPREHENSIVE METABOLIC PANEL (CC13)
ALT: 10 U/L (ref 0–55)
AST: 17 U/L (ref 5–34)
Albumin: 3.5 g/dL (ref 3.5–5.0)
Alkaline Phosphatase: 108 U/L (ref 40–150)
Anion Gap: 8 mEq/L (ref 3–11)
BUN: 14.5 mg/dL (ref 7.0–26.0)
CHLORIDE: 107 meq/L (ref 98–109)
CO2: 28 mEq/L (ref 22–29)
Calcium: 9.3 mg/dL (ref 8.4–10.4)
Creatinine: 1 mg/dL (ref 0.6–1.1)
EGFR: 58 mL/min/{1.73_m2} — ABNORMAL LOW (ref >90)
Glucose: 143 mg/dl — ABNORMAL HIGH (ref 70–140)
POTASSIUM: 4.2 meq/L (ref 3.5–5.1)
SODIUM: 142 meq/L (ref 136–145)
TOTAL PROTEIN: 7.1 g/dL (ref 6.4–8.3)
Total Bilirubin: 0.75 mg/dL (ref 0.20–1.20)

## 2015-06-29 ENCOUNTER — Encounter: Payer: Self-pay | Admitting: Radiation Oncology

## 2015-06-29 ENCOUNTER — Ambulatory Visit
Admission: RE | Admit: 2015-06-29 | Discharge: 2015-06-29 | Disposition: A | Discharge status: Home / Self Care | Payer: Medicare Other | Source: Ambulatory Visit | Attending: Radiation Oncology | Admitting: Radiation Oncology

## 2015-06-29 ENCOUNTER — Telehealth: Payer: Self-pay | Admitting: *Deleted

## 2015-06-29 VITALS — BP 143/82 | HR 87 | Resp 16 | Wt 146.0 lb

## 2015-06-29 DIAGNOSIS — C3491 Malignant neoplasm of unspecified part of right bronchus or lung: Secondary | ICD-10-CM

## 2015-06-29 NOTE — Progress Notes (Signed)
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 Non-small cell cancer of right lung 162.9 C34.91 CT Chest Wo Contrast    Interval Since Last Radiation:  19  months  Narrative:  The patient returns today for routine follow-up.  Patient and daughter present to review recent chest CT results. Patient without any significant complaints except intermittent back pain. Patient denies chest pain, shortness of breath or hemoptysis. Reports an occasional dry cough. Denies headache, dizziness, nausea, vomiting, night sweats, weight loss or diarrhea.  ALLERGIES:  is allergic to moxifloxacin; penicillins; and prednisone.  Meds: Current Outpatient Prescriptions  Medication Sig Dispense Refill  . amLODipine (NORVASC) 5 MG tablet Take 5 mg by mouth daily.    Marland Kitchen aspirin 81 MG tablet Take 81 mg by mouth daily.    Marland Kitchen atorvastatin (LIPITOR) 40 MG tablet Take 40 mg by mouth daily.    . benzonatate (TESSALON) 100 MG capsule Take 1 capsule (100 mg total) by mouth 3 (three) times daily as needed for cough. 21 capsule 0  . carvedilol (COREG) 12.5 MG tablet Take 1 tablet (12.5 mg total) by mouth 2 (two) times daily with a meal. 60 tablet 2  . furosemide (LASIX) 40 MG tablet Take 1 tablet (40 mg total) by mouth every other day. 30 tablet 6  . gabapentin (NEURONTIN) 100 MG capsule Take 100 mg by mouth 3 (three) times daily as needed. Pain    . glipiZIDE (GLUCOTROL) 5 MG tablet Take 2.5 mg by mouth daily.     No current facility-administered medications for this encounter.     Physical Findings: The patient is in no acute distress. Patient is alert and oriented.  weight is 146 lb (66.225 kg). Her blood pressure is 143/82 and her pulse is 87. Her respiration is 16. .  No significant changes.   Lab Findings: Lab Results  Component Value Date   WBC 8.8 06/27/2015   HGB 11.8 06/27/2015   HCT 35.7 06/27/2015   MCV 86.9 06/27/2015   PLT 211 06/27/2015    '@LASTCHEM'$ @  Radiographic Findings: Ct Chest Wo Contrast  06/27/2015   CLINICAL DATA:  Non-small cell right lung cancer diagnosed in 2013, radiation therapy completed in 2014. Restaging/surveillance.  EXAM: CT CHEST WITHOUT CONTRAST  TECHNIQUE: Multidetector CT imaging of the chest was performed following the standard protocol without IV contrast.  COMPARISON:  05/01/2015  FINDINGS: Mediastinum/Nodes: Coronary, aortic arch, and branch vessel atherosclerotic vascular disease. Moderate-sized hiatal hernia.  Small pericardial effusion.  Lungs/Pleura: Right upper lobe atelectasis especially along the minor fissure and posterior part of the major fissure, and posteriorly in the right middle lobe, with resulting bandlike opacity similar to prior and extending to the right hilum.  1.2 by 1.2 cm right lower lobe pulmonary nodule, previously the same size by my measurements. Several additional scattered small pulmonary nodules in both lungs are likewise stable.  Emphysema noted. Clustered nodularity in the lingula may well be inflammatory.  Upper abdomen: Unremarkable  Musculoskeletal: Stable compression fractures in the thoracic spine that T5 and T6.  IMPRESSION: 1. No change in bilateral pulmonary nodules, largest 1.2 cm in the right lower lobe. 2. Emphysema. 3. Continued right upper lobe and posterior right middle lobe atelectasis along the minor fissure, probably from radiation therapy and scarring. 4. Small pericardial effusion. 5. Small to moderate-sized hiatal hernia. 6. Coronary, aortic arch, and branch vessel  atherosclerotic vascular disease. 7. Stable compression fractures at T5 and T6.   Electronically Signed   By: Van Clines M.D.   On: 06/27/2015 11:31   Impression:  The patient is recovering from the effects of radiation.  CT looks OK.  Plan:  Will have a chest CT in 6 months, with follow up after.  This document serves as a record of services personally performed by Tyler Pita, MD. It was created on his behalf by Arlyce Harman, a trained medical scribe. The creation of this record is based on the scribe's personal observations and the provider's statements to them. This document has been checked and approved by the attending provider.    _____________________________________  Sheral Apley. Tammi Klippel, M.D.

## 2015-06-29 NOTE — Telephone Encounter (Signed)
CALLED PATIENT TO INFORM OF SCAN FOR 12/26/15 - ARRIVAL TIME - 9:45 AM @ [facility] AND HER FU VISIT WITH DR. MANNING ON 12/28/15 @ 11:15 AM TO GET HER RESULTS, SPOKE WITH PATIENT AND SHE IS AWARE OF THESE APPTS.

## 2015-06-29 NOTE — Progress Notes (Signed)
Patient and daughter present to review recent chest CT results. Patient without any significant complaints except intermittent back pain. Patient denies chest pain, shortness of breath or hemoptysis. Reports an occasional dry cough. Denies headache, dizziness, nausea, vomiting, night sweats, weight loss or diarrhea.  BP 143/82 mmHg  Pulse 87  Resp 16  Wt 146 lb (66.225 kg) Wt Readings from Last 3 Encounters:  06/29/15 146 lb (66.225 kg)  05/01/15 146 lb (66.225 kg)  03/20/15 146 lb (66.225 kg)

## 2015-06-30 ENCOUNTER — Telehealth: Payer: Self-pay | Admitting: Internal Medicine

## 2015-06-30 NOTE — Telephone Encounter (Signed)
returned call and s.w. pt and confirmed appt °

## 2015-07-04 ENCOUNTER — Telehealth: Payer: Self-pay | Admitting: Internal Medicine

## 2015-07-04 ENCOUNTER — Ambulatory Visit (HOSPITAL_BASED_OUTPATIENT_CLINIC_OR_DEPARTMENT_OTHER): Payer: Medicare Other | Admitting: Internal Medicine

## 2015-07-04 ENCOUNTER — Other Ambulatory Visit: Payer: Self-pay | Admitting: Medical Oncology

## 2015-07-04 ENCOUNTER — Encounter: Payer: Self-pay | Admitting: Internal Medicine

## 2015-07-04 ENCOUNTER — Other Ambulatory Visit: Payer: Self-pay | Admitting: Internal Medicine

## 2015-07-04 VITALS — BP 164/97 | HR 119 | Temp 98.4°F | Resp 17 | Ht 67.0 in | Wt 142.7 lb

## 2015-07-04 DIAGNOSIS — C3491 Malignant neoplasm of unspecified part of right bronchus or lung: Secondary | ICD-10-CM

## 2015-07-04 DIAGNOSIS — C3431 Malignant neoplasm of lower lobe, right bronchus or lung: Secondary | ICD-10-CM | POA: Diagnosis not present

## 2015-07-04 DIAGNOSIS — I10 Essential (primary) hypertension: Secondary | ICD-10-CM

## 2015-07-04 NOTE — Telephone Encounter (Signed)
Add to previous note. Radiology schedulers will contact patient re ct appointment - patient/dtr aware.

## 2015-07-04 NOTE — Progress Notes (Signed)
[name] [phone]   [phone]  OFFICE PROGRESS NOTE  Kandice Hams, MD [address] [place]  [address]  DIAGNOSIS: Stage IA (T1b, N0, M0) non-small cell lung cancer consistent with adenocarcinoma involving the right lower lobe, diagnosed in September 2014.  PRIOR THERAPY: Status post curative stereotactic radiotherapy to the right lower lobe lung nodule under the care of Dr. Tammi Klippel completed on 10/29/2013.  CURRENT THERAPY: Observation.  INTERVAL HISTORY: Joan Hunter 81 y.o. female returns to the clinic today for six-month followup visit accompanied by her daughter. The patient is feeling fine today was no specific complaints except for shortness breath with exertion. The patient denied having any chest pain, cough or hemoptysis. She has no fever or chills, no nausea or vomiting. She denied having any significant weight loss or night sweats. She had repeat CT scan of the chest performed recently and she is here for evaluation and discussion of her scan results.  MEDICAL HISTORY: Past Medical History  Diagnosis Date  . CHF (congestive heart failure)   . Hyperlipidemia   . Hypertension   . Arthritis   . Dilated cardiomyopathy     now resolved, EF 60-65% echo 10/11/11 (Dr. Marlou Porch)  . CAD (coronary artery disease)     mod 2V CAD (60% LAD, 70-80% mid CX) by 01/2008 cath  . Lung mass   . Allergic rhinitis   . Diverticulosis   . Osteopenia   . MR (mitral regurgitation)   . Diabetes mellitus     dx 10 plus yrs ago    ALLERGIES:  is allergic to moxifloxacin; penicillins; and prednisone.  MEDICATIONS:  Current Outpatient Prescriptions  Medication Sig Dispense Refill  . amLODipine (NORVASC) 5 MG tablet Take 5 mg by mouth daily.    Marland Kitchen aspirin 81 MG tablet Take 81 mg by mouth daily.    Marland Kitchen atorvastatin (LIPITOR) 40 MG tablet Take 40 mg by mouth daily.    . carvedilol (COREG) 12.5 MG tablet Take 1 tablet (12.5 mg  total) by mouth 2 (two) times daily with a meal. 60 tablet 2  . furosemide (LASIX) 40 MG tablet Take 1 tablet (40 mg total) by mouth every other day. 30 tablet 6  . gabapentin (NEURONTIN) 100 MG capsule Take 100 mg by mouth 3 (three) times daily as needed. Pain    . glipiZIDE (GLUCOTROL) 5 MG tablet Take 2.5 mg by mouth daily.    . GuaiFENesin (COUGH SYRUP PO) Take by mouth.     No current facility-administered medications for this visit.    SURGICAL HISTORY:  Past Surgical History  Procedure Laterality Date  . Abdominal hysterectomy    . Appendectomy    . Knee arthroscopy      ?  left  knee  . Eye surgery      bil cataracts  . Tonsillectomy      REVIEW OF SYSTEMS:  A comprehensive review of systems was negative except for: Musculoskeletal: positive for back pain   PHYSICAL EXAMINATION: General appearance: alert, cooperative and no distress Head: Normocephalic, without obvious abnormality, atraumatic Neck: no adenopathy, no JVD, supple, symmetrical, trachea midline and thyroid not enlarged, symmetric, no tenderness/mass/nodules Lymph nodes: Cervical, supraclavicular, and axillary nodes normal. Resp: clear to auscultation bilaterally Back: symmetric, no curvature. ROM normal. No CVA tenderness. Cardio: regular rate and rhythm, S1, S2 normal, no murmur, click, rub or gallop GI: soft, non-tender; bowel sounds normal; no masses,  no organomegaly Extremities:  extremities normal, atraumatic, no cyanosis or edema  ECOG PERFORMANCE STATUS: 1 - Symptomatic but completely ambulatory  Blood pressure 164/97, pulse 119, temperature 98.4 F (36.9 C), temperature source Oral, resp. rate 17, height '5\' 7"'$  (1.702 m), weight 142 lb 11.2 oz (64.728 kg), SpO2 100 %.  LABORATORY DATA: Lab Results  Component Value Date   WBC 8.8 06/27/2015   HGB 11.8 06/27/2015   HCT 35.7 06/27/2015   MCV 86.9 06/27/2015   PLT 211 06/27/2015      Chemistry      Component Value Date/Time   NA 142  06/27/2015 0943   NA 142 05/01/2015 1900   K 4.2 06/27/2015 0943   K 2.9* 05/01/2015 1900   CL 98* 05/01/2015 1900   CO2 28 06/27/2015 0943   CO2 26 02/10/2015 0611   BUN 14.5 06/27/2015 0943   BUN 15 05/01/2015 1900   CREATININE 1.0 06/27/2015 0943   CREATININE 1.00 05/01/2015 1900   CREATININE 0.91 08/05/2013 1457      Component Value Date/Time   CALCIUM 9.3 06/27/2015 0943   CALCIUM 8.8 02/10/2015 0611   ALKPHOS 108 06/27/2015 0943   ALKPHOS 97 02/09/2015 0754   AST 17 06/27/2015 0943   AST 29 02/09/2015 0754   ALT 10 06/27/2015 0943   ALT 17 02/09/2015 0754   BILITOT 0.75 06/27/2015 0943   BILITOT 0.6 02/09/2015 0754       RADIOGRAPHIC STUDIES: Ct Chest Wo Contrast  06/27/2015   CLINICAL DATA:  Non-small cell right lung cancer diagnosed in 2013, radiation therapy completed in 2014. Restaging/surveillance.  EXAM: CT CHEST WITHOUT CONTRAST  TECHNIQUE: Multidetector CT imaging of the chest was performed following the standard protocol without IV contrast.  COMPARISON:  05/01/2015  FINDINGS: Mediastinum/Nodes: Coronary, aortic arch, and branch vessel atherosclerotic vascular disease. Moderate-sized hiatal hernia.  Small pericardial effusion.  Lungs/Pleura: Right upper lobe atelectasis especially along the minor fissure and posterior part of the major fissure, and posteriorly in the right middle lobe, with resulting bandlike opacity similar to prior and extending to the right hilum.  1.2 by 1.2 cm right lower lobe pulmonary nodule, previously the same size by my measurements. Several additional scattered small pulmonary nodules in both lungs are likewise stable.  Emphysema noted. Clustered nodularity in the lingula may well be inflammatory.  Upper abdomen: Unremarkable  Musculoskeletal: Stable compression fractures in the thoracic spine that T5 and T6.  IMPRESSION: 1. No change in bilateral pulmonary nodules, largest 1.2 cm in the right lower lobe. 2. Emphysema. 3. Continued right upper  lobe and posterior right middle lobe atelectasis along the minor fissure, probably from radiation therapy and scarring. 4. Small pericardial effusion. 5. Small to moderate-sized hiatal hernia. 6. Coronary, aortic arch, and branch vessel atherosclerotic vascular disease. 7. Stable compression fractures at T5 and T6.   Electronically Signed   By: Van Clines M.D.   On: 06/27/2015 11:31   ASSESSMENT AND PLAN: This is a very pleasant 81 years old [place] American female with history of stage IA non-small cell lung cancer involving the right lower lobe status post curative stereotactic radiotherapy completed in November 2014 and she is currently on observation.  Her recent CT scan of the chest showed stable disease with stable right lower lobe pulmonary nodule. I discussed the scan resultswith the patient and her daughter. I recommended for her to continue on observation with repeat CT scan of the chest in 6 months. For hypertension, the patient did not take her blood pressure medication  today. I strongly encouraged her to take her antihypertensive medication regularly as prescribed and to discuss with her primary care physician to see if she needs any adjustment of these medications. She was advised to call immediately if she has any concerning symptoms in the interval.  The patient voices understanding of current disease status and treatment options and is in agreement with the current care plan.  All questions were answered. The patient knows to call the clinic with any problems, questions or concerns. We can certainly see the patient much sooner if necessary.  Disclaimer: This note was dictated with voice recognition software. Similar sounding words can inadvertently be transcribed and may not be corrected upon review.

## 2015-07-04 NOTE — Telephone Encounter (Signed)
Gave patient avs report and appointments for January 2017 based on orders entered for lab and ct scan. Message to MM to confirm appointments to be in 6 mos - no pof received today.

## 2015-07-28 ENCOUNTER — Encounter: Payer: Self-pay | Admitting: *Deleted

## 2015-08-08 ENCOUNTER — Ambulatory Visit (INDEPENDENT_AMBULATORY_CARE_PROVIDER_SITE_OTHER): Payer: Medicare Other | Admitting: Cardiology

## 2015-08-08 ENCOUNTER — Encounter: Payer: Self-pay | Admitting: Cardiology

## 2015-08-08 VITALS — BP 134/82 | HR 94 | Ht 63.5 in | Wt 140.5 lb

## 2015-08-08 DIAGNOSIS — I444 Left anterior fascicular block: Secondary | ICD-10-CM

## 2015-08-08 DIAGNOSIS — I2583 Coronary atherosclerosis due to lipid rich plaque: Secondary | ICD-10-CM

## 2015-08-08 DIAGNOSIS — I2510 Atherosclerotic heart disease of native coronary artery without angina pectoris: Secondary | ICD-10-CM

## 2015-08-08 DIAGNOSIS — C3491 Malignant neoplasm of unspecified part of right bronchus or lung: Secondary | ICD-10-CM

## 2015-08-08 DIAGNOSIS — I340 Nonrheumatic mitral (valve) insufficiency: Secondary | ICD-10-CM | POA: Diagnosis not present

## 2015-08-08 DIAGNOSIS — I10 Essential (primary) hypertension: Secondary | ICD-10-CM | POA: Diagnosis not present

## 2015-08-08 NOTE — Progress Notes (Addendum)
[facility]. [address] [place], [place]  [address] Phone: [phone] Fax:  [phone]  Date:  08/08/2015   ID:  Joan Hunter, DOB 07/11/1927, MRN [id number]  PCP:  Kandice Hams, MD   History of Present Illness: Joan Hunter is a 81 y.o. female with moderate coronary artery disease prior catheterization in 2009, prior idiopathic cardiomyopathy (EF was 15%) now resolved, mitral regurgitation mild here for followup. 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 Dr. Tammi Klippel. Radiation only.   Went to hospital with cough. COPD/bronchitis. CT scan did not show any evidence of pneumonia. 5/16 - azith. Prednisone shot by Dr. [name]. Cough is still troublesome. Treating hiatal hernia with Protonix as well.   Continuing with atorvastatin, hyperlipidemia. Mild shortness of breath with activity. Encouraged activity.  She is trying to do more walking to improve her circulation. She's not having any chest discomfort.   She experienced some dizziness when standing and her medications were reduced.   Her daughter helps her out significantly but she also make sure that she gets up and moves around adequately.    Wt Readings from Last 3 Encounters:  08/08/15 140 lb 8 oz (63.73 kg)  07/04/15 142 lb 11.2 oz (64.728 kg)  06/29/15 146 lb (66.225 kg)     Past Medical History  Diagnosis Date  . CHF (congestive heart failure)   . Hyperlipidemia   . Hypertension   . Arthritis   . Dilated cardiomyopathy     now resolved, EF 60-65% echo 10/11/11 (Dr. Marlou Porch)  . CAD (coronary artery disease)     mod 2V CAD (60% LAD, 70-80% mid CX) by 01/2008 cath  . Lung mass   . Allergic rhinitis   . Diverticulosis   . Osteopenia   . MR (mitral regurgitation)   . Diabetes mellitus     dx 10 plus yrs ago    Past Surgical History  Procedure Laterality Date  . Abdominal hysterectomy    . Appendectomy      . Knee arthroscopy      ?  left  knee  . Eye surgery      bil cataracts  . Tonsillectomy      Current Outpatient Prescriptions  Medication Sig Dispense Refill  . amLODipine (NORVASC) 5 MG tablet Take 5 mg by mouth daily.    Marland Kitchen aspirin 81 MG tablet Take 81 mg by mouth daily.    . carvedilol (COREG) 12.5 MG tablet Take 1 tablet (12.5 mg total) by mouth 2 (two) times daily with a meal. 60 tablet 2  . CHERATUSSIN AC 100-10 MG/5ML syrup     . furosemide (LASIX) 40 MG tablet Take 1 tablet (40 mg total) by mouth every other day. 30 tablet 6  . glipiZIDE (GLUCOTROL) 5 MG tablet Take 2.5 mg by mouth daily.    . pantoprazole (PROTONIX) 40 MG tablet      No current facility-administered medications for this visit.    Allergies:    Allergies  Allergen Reactions  . Moxifloxacin     REACTION: hives  . Penicillins     REACTION: hives  . Prednisone     REACTION: hives    Social History:  The patient  reports that she quit smoking about 43 years ago. Her smoking use included Cigarettes. She smoked 0.50 packs per day. She does not have any smokeless tobacco history on file. She reports  that she does not drink alcohol or use illicit drugs.   ROS:  Please see the history of present illness. Positive for cough, shortness of breath with activity, back pain, easy bruising, snoring, wheezing, balance, fatigue Denies any syncope, bleeding, orthopnea, PND   PHYSICAL EXAM: VS:  BP 134/82 mmHg  Pulse 94  Ht 5' 3.5" (1.613 m)  Wt 140 lb 8 oz (63.73 kg)  BMI 24.49 kg/m2  SpO2 98% Well nourished, well developed, in no acute distress, walks with cane, slowed down. HEENT: normal Neck: no JVD Cardiac:  normal S1, S2; RRR; no obvious murmur Lungs:  Mild wheeze heard left lower lobe, somewhat clearing with cough  Abd: soft, nontender, no hepatomegaly Ext: no edema Skin: warm and dry Neuro: no focal abnormalities noted  Creatinine 0.7 on 10/14, liver functions normal.  EKG:  12/02/13-will rhythm,  sinus arrhythmia, left axis deviation/left anterior fascicular block, LVH, nonspecific ST changes, heart rate 73    ECHO 2012:   1. Normal LV size and function.  2. There were no regional wall motion abnormalities.  3. Left ventricular ejection fraction estimated by 2D at 60-65 percent.  4. Mild mitral valve regurgitation.  5. Trivial tricuspid regurgitation.  Labs: 06/27/15-creatinine 1.0, potassium 4.2, hemoglobin of 11.8  CT scan on 06/27/15 showed small to moderate hiatal hernia, emphysema, trivial/small pericardial effusion.   ASSESSMENT AND PLAN:  1. Left ventricular systolic dysfunction-now resolved, doing well. Continue to encourage activity. This should help with her fatigue. 2. Non-small cell cancer of right lung, stage IA-Dr. Tammi Klippel, Dr. Joya Gaskins. Currently finished with radiation. Formally known as BAC. Dr. Tammi Klippel, doing well 3. Pulmonary fibrosis-Dr. Joya Gaskins. COPD, battling cough currently. Dr. [name] gave guaifenesin/codeine cough syrup. August 10. She is still having residual cough. I asked them to contact his office. Stable.posthospitalization September 2016 with inflammatory state.  4. Mitral regurgitation-previously characterized as severe but now mild. Doing well. 5. Hypertension-currently reasonable controlled. Off of hydralazine and amlodipine. Off lisinopril now, on losartan.  Decreased coreg because of dizziness. Symptoms of orthostatic hypotension. She is doing better with decrease in medication.  6. Edema - QOD lasix, Good looking ankles.  7. Left anterior fascicular block-chronic, unchanged, no syncope 8. 4 month follow up. (Daughter requests)   Signed, Candee Furbish, MD [facility]  08/08/2015 8:42 AM

## 2015-08-08 NOTE — Patient Instructions (Signed)
Medication Instructions:  Your physician recommends that you continue on your current medications as directed. Please refer to the Current Medication list given to you today.  Follow-Up: Follow up in 4 months with Dr Marlou Porch.  Thank you for choosing [facility]!!

## 2015-10-23 ENCOUNTER — Encounter: Payer: Self-pay | Admitting: Cardiology

## 2015-11-08 ENCOUNTER — Telehealth: Payer: Self-pay | Admitting: *Deleted

## 2015-11-08 NOTE — Telephone Encounter (Signed)
Call from CT with request if pt will  Need CT with or without contrast as both ordered. Reviewed with MD, pt Creatine is good, pt may have CT with Contrast.

## 2015-11-21 ENCOUNTER — Ambulatory Visit (INDEPENDENT_AMBULATORY_CARE_PROVIDER_SITE_OTHER): Payer: Medicare Other | Admitting: Cardiology

## 2015-11-21 ENCOUNTER — Encounter: Payer: Self-pay | Admitting: Cardiology

## 2015-11-21 VITALS — BP 170/98 | HR 121 | Ht 65.0 in | Wt 137.8 lb

## 2015-11-21 DIAGNOSIS — C3491 Malignant neoplasm of unspecified part of right bronchus or lung: Secondary | ICD-10-CM

## 2015-11-21 DIAGNOSIS — I2583 Coronary atherosclerosis due to lipid rich plaque: Principal | ICD-10-CM

## 2015-11-21 DIAGNOSIS — I2510 Atherosclerotic heart disease of native coronary artery without angina pectoris: Secondary | ICD-10-CM | POA: Diagnosis not present

## 2015-11-21 DIAGNOSIS — I340 Nonrheumatic mitral (valve) insufficiency: Secondary | ICD-10-CM

## 2015-11-21 DIAGNOSIS — I10 Essential (primary) hypertension: Secondary | ICD-10-CM

## 2015-11-21 NOTE — Progress Notes (Signed)
[facility]. [address], [address] [place], [place]  [address] Phone: [phone] Fax:  [phone]  Date:  11/21/2015   ID:  LEELOO Hunter, DOB 18-Jan-1927, MRN [id number]  PCP:  Kandice Hams, MD   History of Present Illness: Joan Hunter is a 81 y.o. female with moderate coronary artery disease prior catheterization in 2009, prior idiopathic cardiomyopathy (EF was 15%) now resolved, mitral regurgitation mild here for followup. 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 Dr. Tammi Klippel. Radiation only.   Went to hospital with cough. COPD/bronchitis. CT scan did not show any evidence of pneumonia. 5/16 - azith. Prednisone shot by Dr. [name]. Cough is still troublesome. Treating hiatal hernia with Protonix as well.   Continuing with atorvastatin, hyperlipidemia. Mild shortness of breath with activity. Encouraged activity.  She is trying to do more walking to improve her circulation. She's not having any chest discomfort.   She experienced some dizziness when standing and her medications were reduced.   Her daughter, Joan Hunter, helps her out significantly but she also make sure that she gets up and moves around adequately.    Wt Readings from Last 3 Encounters:  11/21/15 137 lb 12.8 oz (62.506 kg)  08/08/15 140 lb 8 oz (63.73 kg)  07/04/15 142 lb 11.2 oz (64.728 kg)     Past Medical History  Diagnosis Date  . CHF (congestive heart failure) ([facility])   . Hyperlipidemia   . Hypertension   . Arthritis   . Dilated cardiomyopathy ([place])     now resolved, EF 60-65% echo 10/11/11 (Dr. Marlou Porch)  . CAD (coronary artery disease)     mod 2V CAD (60% LAD, 70-80% mid CX) by 01/2008 cath  . Lung mass   . Allergic rhinitis   . Diverticulosis   . Osteopenia   . MR (mitral regurgitation)   . Diabetes mellitus     dx 10 plus yrs ago    Past Surgical History  Procedure Laterality Date  . Abdominal  hysterectomy    . Appendectomy    . Knee arthroscopy      ?  left  knee  . Eye surgery      bil cataracts  . Tonsillectomy      Current Outpatient Prescriptions  Medication Sig Dispense Refill  . amLODipine (NORVASC) 5 MG tablet Take 5 mg by mouth daily.    Marland Kitchen aspirin 81 MG tablet Take 81 mg by mouth daily.    Marland Kitchen atorvastatin (LIPITOR) 40 MG tablet     . carvedilol (COREG) 12.5 MG tablet Take 1 tablet (12.5 mg total) by mouth 2 (two) times daily with a meal. 60 tablet 2  . CHERATUSSIN AC 100-10 MG/5ML syrup     . chlorpheniramine-HYDROcodone (TUSSIONEX) 10-8 MG/5ML SUER     . furosemide (LASIX) 40 MG tablet Take 1 tablet (40 mg total) by mouth every other day. 30 tablet 6  . glipiZIDE (GLUCOTROL) 5 MG tablet Take 2.5 mg by mouth daily.    . pantoprazole (PROTONIX) 40 MG tablet      No current facility-administered medications for this visit.    Allergies:    Allergies  Allergen Reactions  . Moxifloxacin     REACTION: hives  . Penicillins     REACTION: hives  . Prednisone     REACTION: hives    Social History:  The patient  reports that she quit smoking about 43 years  ago. Her smoking use included Cigarettes. She smoked 0.50 packs per day. She does not have any smokeless tobacco history on file. She reports that she does not drink alcohol or use illicit drugs.   ROS:  Please see the history of present illness. Positive for cough, shortness of breath with activity, back pain, easy bruising, snoring, wheezing, balance, fatigue Denies any syncope, bleeding, orthopnea, PND   PHYSICAL EXAM: VS:  BP 170/98 mmHg  Pulse 121  Ht '5\' 5"'$  (1.651 m)  Wt 137 lb 12.8 oz (62.506 kg)  BMI 22.93 kg/m2  SpO2 95% Well nourished, well developed, in no acute distress, walks with cane, slowed down. HEENT: normal Neck: no JVD Cardiac:  normal S1, S2; RRR; no obvious murmur Lungs:  Mild wheeze heard left lower lobe, somewhat clearing with cough  Abd: soft, nontender, no hepatomegaly Ext: no  edema Skin: warm and dry Neuro: no focal abnormalities noted  Creatinine 0.7 on 10/14, liver functions normal.  EKG:  12/02/13-will rhythm, sinus arrhythmia, left axis deviation/left anterior fascicular block, LVH, nonspecific ST changes, heart rate 73    ECHO 2012:   1. Normal LV size and function.  2. There were no regional wall motion abnormalities.  3. Left ventricular ejection fraction estimated by 2D at 60-65 percent.  4. Mild mitral valve regurgitation.  5. Trivial tricuspid regurgitation.  Labs: 06/27/15-creatinine 1.0, potassium 4.2, hemoglobin of 11.8  CT scan on 06/27/15 showed small to moderate hiatal hernia, emphysema, trivial/small pericardial effusion.   ASSESSMENT AND PLAN:  1. Left ventricular systolic dysfunction-now resolved, doing well. Continue to encourage activity. This should help with her fatigue. 2. Non-small cell cancer of right lung, stage IA-Dr. Tammi Klippel, Dr. Joya Gaskins. Currently finished with radiation. Formally known as BAC. Dr. Tammi Klippel, doing well 3. Pulmonary fibrosis-Dr. Joya Gaskins. COPD, battling cough currently. Dr. [name] gave guaifenesin/codeine cough syrup. August 10. She is still having residual cough. I asked them to contact his office. Stable.posthospitalization September 2016 with inflammatory state.  4. Mitral regurgitation-previously characterized as severe but now mild. Doing well. 5. Hypertension-on repeat 144/90. Currently reasonable controlled. Off of hydralazine and amlodipine. Off lisinopril now, on losartan.  Decreased coreg because of dizziness. Symptoms of orthostatic hypotension. She is doing better with decrease in medication.  6. Edema - QOD lasix, Good looking ankles.  7. Left anterior fascicular block-chronic, unchanged, no syncope 8. 4 month follow up. (Daughter requests)   Signed, Candee Furbish, MD [facility]  11/21/2015 11:12 AM

## 2015-11-21 NOTE — Patient Instructions (Signed)
Medication Instructions:  The current medical regimen is effective;  continue present plan and medications.   Follow-Up: Follow up in 4 months with Dr. Marlou Porch.  You will receive a letter in the mail 2 months before you are due.  Please call us when you receive this letter to schedule your follow up appointment.   If you need a refill on your cardiac medications before your next appointment, please call your pharmacy.  Thank you for choosing [facility]!!

## 2015-11-28 ENCOUNTER — Ambulatory Visit: Payer: Self-pay | Admitting: Cardiology

## 2015-12-22 ENCOUNTER — Telehealth: Payer: Self-pay | Admitting: Radiation Oncology

## 2015-12-22 NOTE — Telephone Encounter (Signed)
Received a call from the patient's daughter, Joan Hunter, concerned that multiple CT scans have been scheduled for her mother a week apart. Noted the patient has been scheduled by dr. Tammi Klippel for a CT chest without contrast on 1/10 and a CT of the chest with contrast by Dr. Julien Nordmann on 1/17. Advised the daughter to plan to present for the CT chest on 1/10. Explained this RN will clarify with Dr. Tammi Klippel if this scan should be without or with contrast and if the subsequent one on the 17th can be cancelled.

## 2015-12-24 NOTE — Telephone Encounter (Signed)
Actually, let's cancel my 1/10 without contrast and have her go on 1/17 for the scan with [facility].  We can also cancel my follow-up if I was supposed to see her after the scan.

## 2015-12-25 ENCOUNTER — Telehealth: Payer: Self-pay | Admitting: Radiation Oncology

## 2015-12-25 NOTE — Telephone Encounter (Signed)
Phoned patient's daughter, [name]. Explained her mother's CT without contrast on 1/10 and follow up with Dr. Tammi Klippel on 1/12 have been cancelled. Understands her mother should present on the 1/17 for lab work and CT chest with contrast then, follow up with [facility] on 1/19 for results. She verbalized understanding of all reviewed and expressed appreciation for the call.

## 2015-12-26 ENCOUNTER — Ambulatory Visit (INDEPENDENT_AMBULATORY_CARE_PROVIDER_SITE_OTHER): Payer: Medicare Other | Admitting: Internal Medicine

## 2015-12-26 ENCOUNTER — Other Ambulatory Visit (INDEPENDENT_AMBULATORY_CARE_PROVIDER_SITE_OTHER): Payer: Medicare Other

## 2015-12-26 ENCOUNTER — Ambulatory Visit (HOSPITAL_COMMUNITY): Payer: Medicare Other

## 2015-12-26 ENCOUNTER — Other Ambulatory Visit: Payer: Self-pay | Admitting: Internal Medicine

## 2015-12-26 ENCOUNTER — Encounter: Payer: Self-pay | Admitting: Internal Medicine

## 2015-12-26 VITALS — BP 128/88 | HR 120 | Ht 65.0 in | Wt 137.0 lb

## 2015-12-26 DIAGNOSIS — R059 Cough, unspecified: Secondary | ICD-10-CM

## 2015-12-26 DIAGNOSIS — J8410 Pulmonary fibrosis, unspecified: Secondary | ICD-10-CM

## 2015-12-26 DIAGNOSIS — R05 Cough: Secondary | ICD-10-CM | POA: Diagnosis not present

## 2015-12-26 DIAGNOSIS — I10 Essential (primary) hypertension: Secondary | ICD-10-CM | POA: Diagnosis not present

## 2015-12-26 LAB — CBC WITH DIFFERENTIAL/PLATELET
BASOS ABS: 0 10*3/uL (ref 0.0–0.1)
Basophils Relative: 0.3 % (ref 0.0–3.0)
Eosinophils Absolute: 0.3 10*3/uL (ref 0.0–0.7)
Eosinophils Relative: 3.3 % (ref 0.0–5.0)
HEMATOCRIT: 40.6 % (ref 36.0–46.0)
HEMOGLOBIN: 13.2 g/dL (ref 12.0–15.0)
LYMPHS ABS: 1.8 10*3/uL (ref 0.7–4.0)
Lymphocytes Relative: 18.8 % (ref 12.0–46.0)
MCHC: 32.5 g/dL (ref 30.0–36.0)
MCV: 84.3 fl (ref 78.0–100.0)
Monocytes Absolute: 0.6 10*3/uL (ref 0.1–1.0)
Monocytes Relative: 6.8 % (ref 3.0–12.0)
NEUTROS PCT: 70.8 % (ref 43.0–77.0)
Neutro Abs: 6.8 10*3/uL (ref 1.4–7.7)
Platelets: 282 10*3/uL (ref 150.0–400.0)
RBC: 4.82 Mil/uL (ref 3.87–5.11)
RDW: 16.6 % — ABNORMAL HIGH (ref 11.5–15.5)
WBC: 9.6 10*3/uL (ref 4.0–10.5)

## 2015-12-26 LAB — SEDIMENTATION RATE: Sed Rate: 31 mm/hr — ABNORMAL HIGH (ref 0–22)

## 2015-12-26 MED ORDER — NEBIVOLOL HCL 10 MG PO TABS: 10.0000 mg | ORAL_TABLET | Freq: Every day | ORAL | Status: DC

## 2015-12-26 NOTE — Patient Instructions (Addendum)
Pantoprazole (protonix) 40 mg   Take  30-60 min before first meal of the day and Pepcid (famotidine)  20 mg one @  bedtime until return to office - this is the best way to tell whether stomach acid is contributing to your problem.    GERD (REFLUX)  is an extremely common cause of respiratory symptoms just like yours , many times with no obvious heartburn at all.    It can be treated with medication, but also with lifestyle changes including elevation of the head of your bed (ideally with 6 inch  bed blocks),  Smoking cessation, avoidance of late meals, excessive alcohol, and avoid fatty foods, chocolate, peppermint, colas, red wine, and acidic juices such as orange juice.  NO MINT OR MENTHOL PRODUCTS SO NO COUGH DROPS  USE SUGARLESS CANDY INSTEAD (Jolley ranchers or Stover's or Life Savers) or even ice chips will also do - the key is to swallow to prevent all throat clearing. NO OIL BASED VITAMINS - use powdered substitutes.    Stop corevidol and start bystolic10 mg  daily    Please remember to go to the lab  department downstairs for your tests - we will call you with the results when they are available.     Please schedule a follow up office visit in 3 weeks, sooner if needed  - When return bring your medications in 2 separate bags, the ones you take no matter(automatically)  what vs the as needed (only when you feel you need them)

## 2015-12-26 NOTE — Progress Notes (Signed)
Subjective:    Patient ID: Joan Hunter, female    DOB: [birth year], 82 y.o.   MRN: [id number]  HPI  63 yobf quit smoking 1973 dx non-small cell lung cancer involving the right lower lobe status post curative stereotactic radiotherapy completed in November 2014 and she is currently on observation by Dr Mohammed/ RT referred to pulmonary clinic 12/26/2015 by Dr [name]  for sob and cough   12/26/2015 1st [facility] office visit/ Danniell Rotundo   Chief Complaint  Patient presents with  . PULMONARY CONSULT    Referred by Dr. [name]. Pt c/o chronic dry cough and SOB with some wheeze. Pt has been told she has COPD but is not sure.   cough started p RT, dry , worse since dec 2016 to point of loses voice at rest/ better if not using voice/ comfortable at rest and hs Some better with narc cough syrups/ no better on dulera or saba to date   No obvious  day to day or daytime variabilty or assoc excess/ purulent sputum or mucus plugs   or cp or chest tightness,  overt sinus or hb symptoms. No unusual exp hx or h/o childhood pna/ asthma or knowledge of premature birth.  Sleeping ok without nocturnal  or early am exacerbation  of respiratory  c/o's or need for noct saba. Also denies any obvious fluctuation of symptoms with weather or environmental changes or other aggravating or alleviating factors except as outlined above   Current Medications, Allergies, Complete Past Medical History, Past Surgical History, Family History, and Social History were reviewed in [employer] record.              Review of Systems  Constitutional: Negative.  Negative for fever and unexpected weight change.  HENT: Negative.  Negative for congestion, dental problem, ear pain, nosebleeds, postnasal drip, rhinorrhea, sinus pressure, sneezing, sore throat and trouble swallowing.   Eyes: Negative.  Negative for redness and itching.  Respiratory: Positive for cough, shortness of breath and wheezing.  Negative for chest tightness.   Cardiovascular: Negative.  Negative for palpitations and leg swelling.  Gastrointestinal: Negative.  Negative for nausea and vomiting.  Endocrine: Negative.   Genitourinary: Negative.  Negative for dysuria.  Musculoskeletal: Positive for arthralgias. Negative for joint swelling.  Skin: Negative.  Negative for rash.  Allergic/Immunologic: Negative.   Neurological: Negative.  Negative for headaches.  Hematological: Bruises/bleeds easily.  Psychiatric/Behavioral: Negative.  Negative for dysphoric mood. The patient is not nervous/anxious.        Objective:   Physical Exam  amb stoic bf nad/ minimal dry spont coughing fits   Wt Readings from Last 3 Encounters:  12/26/15 137 lb (62.143 kg)  11/21/15 137 lb 12.8 oz (62.506 kg)  08/08/15 140 lb 8 oz (63.73 kg)    Vital signs reviewed    HEENT: nl turbinates, and oropharynx. Nl external ear canals without cough reflex  - full set of dentures    NECK :  without JVD/Nodes/TM/ nl carotid upstrokes bilaterally   LUNGS: no acc muscle use,  Nl contour chest with insp and exp rhonchi/ pseudowheeze bilaterally    CV:  RRR  no s3 or murmur or increase in P2, no edema   ABD:  soft and nontender with nl inspiratory excursion in the supine position. No bruits or organomegaly, bowel sounds nl  MS:  Nl gait/ ext warm without deformities, calf tenderness, cyanosis or clubbing No obvious joint restrictions   SKIN: warm and dry without  lesions    NEURO:  alert, approp, nl sensorium with  no motor deficits     I personally reviewed images and agree with radiology impression as follows:  CT Chest   06/27/15 1. No change in bilateral pulmonary nodules, largest 1.2 cm in the right lower lobe. 2. Emphysema. 3. Continued right upper lobe and posterior right middle lobe atelectasis along the minor fissure, probably from radiation therapy and scarring. 4. Small pericardial effusion. 5. Small to moderate-sized  hiatal hernia.   CT scan due 1 /17/16 > asked to keep appt     Labs ordered 12/26/2015  Lab Results  Component Value Date   ESRSEDRATE 31* 12/26/2015    Lab Results  Component Value Date   WBC 9.6 12/26/2015   HGB 13.2 12/26/2015   HCT 40.6 12/26/2015   MCV 84.3 12/26/2015   PLT 282.0 12/26/2015        Assessment & Plan:

## 2015-12-27 ENCOUNTER — Encounter: Payer: Self-pay | Admitting: Internal Medicine

## 2015-12-27 DIAGNOSIS — R059 Cough, unspecified: Secondary | ICD-10-CM | POA: Insufficient documentation

## 2015-12-27 DIAGNOSIS — R05 Cough: Secondary | ICD-10-CM | POA: Insufficient documentation

## 2015-12-27 NOTE — Assessment & Plan Note (Signed)
Changed coreg to bystolic 4/76/5465 due to concerns re cough ? Asthma related   Until sort out the source of her cough > Strongly prefer in this setting: Bystolic, the most beta -1  selective Beta blocker available in sample form, with bisoprolol the most selective generic choice  on the market.

## 2015-12-27 NOTE — Assessment & Plan Note (Addendum)
The most common causes of chronic cough in immunocompetent adults include the following: upper airway cough syndrome (UACS), previously referred to as postnasal drip syndrome (PNDS), which is caused by variety of rhinosinus conditions; (2) asthma; (3) GERD; (4) chronic bronchitis from cigarette smoking or other inhaled environmental irritants; (5) nonasthmatic eosinophilic bronchitis; and (6) bronchiectasis.   These conditions, singly or in combination, have accounted for up to 94% of the causes of chronic cough in prospective studies.   Other conditions have constituted no >6% of the causes in prospective studies These have included bronchogenic carcinoma, chronic interstitial pneumonia, sarcoidosis, left ventricular failure, ACEI-induced cough, and aspiration from a condition associated with pharyngeal dysfunction.    Chronic cough is often simultaneously caused by more than one condition. A single cause has been found from 38 to 82% of the time, multiple causes from 18 to 62%. Multiply caused cough has been the result of three diseases up to 42% of the time.       Based on hx and exam, this is most likely:  Not cough variant asthma (though haven't ruled it out),  Not Cough related to RT fibrosis but more likely this is a form of   Upper airway cough syndrome (which is much more amenable to treatment) , so named because it's frequently impossible to sort out how much is  CR/sinusitis with freq throat clearing (which can be related to primary GERD)   vs  causing  secondary (" extra esophageal")  GERD from wide swings in gastric pressure that occur with throat clearing, often  promoting self use of mint and menthol lozenges that reduce the lower esophageal sphincter tone and exacerbate the problem further in a cyclical fashion.   These are the same pts (now being labeled as having "irritable larynx syndrome" by some cough centers) who not infrequently have a history of having failed to tolerate ace  inhibitors,  dry powder inhalers or biphosphonates or report having atypical reflux symptoms that don't respond to standard doses of PPI , and are easily confused as having aecopd or asthma flares by even experienced allergists/ pulmonologists.   The first step is to maximize acid suppression/gerd rx/ try to eliminate cyclical cough with use of hard rock candy instead of narcs,  then regroup with all meds in hand and work thru the ddx of cough as per the most recent chronic cough guidelines.  I had an extended discussion with the patient reviewing all relevant studies completed to date and  lasting 35 min  1) Explained: The standardized cough guidelines published in Chest by Lissa Morales in 2006 are still the best available and consist of a multiple step process (up to 12!) , not a single office visit,  and are intended  to address this problem logically,  with an alogrithm dependent on response to empiric treatment at  each progressive step  to determine a specific diagnosis with  minimal addtional testing needed. Therefore if adherence is an issue or can't be accurately verified,  it's very unlikely the standard evaluation and treatment will be successful here.    Furthermore, response to therapy (other than acute cough suppression, which should only be used short term with avoidance of narcotic containing cough syrups if possible), can be a gradual process for which the patient may perceive immediate benefit.  Unlike going to an eye doctor where the best perscription is almost always the first one and is immediately effective, this is almost never the case in the management of  chronic cough syndromes. Therefore the patient needs to commit up front to consistently adhere to recommendations  for up to 6 weeks of therapy directed at the likely underlying problem(s) before the response can be reasonably evaluated.     2) Each maintenance medication was reviewed in detail including most importantly the  difference between maintenance and prns and under what circumstances the prns are to be triggered using an action plan format that is not reflected in the computer generated alphabetically organized AVS.    Please see instructions for details which were reviewed in writing and the patient given a copy highlighting the part that I personally wrote and discussed at today's ov.   See instructions for specific recommendations which were reviewed directly with the patient who was given a copy with highlighter outlining the key components.

## 2015-12-27 NOTE — Assessment & Plan Note (Signed)
Completed RT Nov 2014 mild/moderate scarring R mid lung   Symptoms esp cough seem out of proportion to CT findings >f/u due in one week

## 2015-12-28 ENCOUNTER — Ambulatory Visit: Payer: Medicare Other | Admitting: Radiation Oncology

## 2015-12-28 LAB — RESPIRATORY ALLERGY PROFILE REGION II ~~LOC~~
Allergen, Cedar tree, t12: 0.14 kU/L — ABNORMAL HIGH
Allergen, Comm Silver Birch, t9: <0.1 kU/L
Allergen, Cottonwood, t14: 0.18 kU/L — ABNORMAL HIGH
Allergen, D pternoyssinus,d7: 0.13 kU/L — ABNORMAL HIGH
Allergen, Mouse Urine Protein, e78: <0.1 kU/L
Allergen, Mulberry, t76: <0.1 kU/L
Alternaria Alternata: 0.1 kU/L — ABNORMAL HIGH
Aspergillus fumigatus, m3: <0.1 kU/L
Bermuda Grass: <0.1 kU/L
Box Elder IgE: <0.1 kU/L
Cat Dander: <0.1 kU/L
Cladosporium Herbarum: <0.1 kU/L
Cockroach: <0.1 kU/L
Common Ragweed: <0.1 kU/L
D. farinae: <0.1 kU/L
Dog Dander: <0.1 kU/L
Elm IgE: <0.1 kU/L
IgE (Immunoglobulin E), Serum: 2368 kU/L — ABNORMAL HIGH (ref <115)
Johnson Grass: <0.1 kU/L
Oak: <0.1 kU/L
Pecan/Hickory Tree IgE: <0.1 kU/L
Penicillium Notatum: <0.1 kU/L
Rough Pigweed  IgE: <0.1 kU/L
Sheep Sorrel IgE: 0.11 kU/L — ABNORMAL HIGH
Timothy Grass: <0.1 kU/L

## 2015-12-28 NOTE — Progress Notes (Signed)
Quick Note:  Spoke with [name], the pt's daughter and notified of recs per MW  She verbalized understanding and nothing further needed ______

## 2016-01-02 ENCOUNTER — Other Ambulatory Visit (HOSPITAL_BASED_OUTPATIENT_CLINIC_OR_DEPARTMENT_OTHER): Payer: Medicare Other

## 2016-01-02 ENCOUNTER — Ambulatory Visit (HOSPITAL_COMMUNITY)
Admission: RE | Admit: 2016-01-02 | Discharge: 2016-01-02 | Disposition: A | Discharge status: Home / Self Care | Payer: Medicare Other | Source: Ambulatory Visit | Attending: Internal Medicine | Admitting: Internal Medicine

## 2016-01-02 ENCOUNTER — Telehealth: Payer: Self-pay | Admitting: Medical Oncology

## 2016-01-02 ENCOUNTER — Encounter (HOSPITAL_COMMUNITY): Payer: Self-pay

## 2016-01-02 DIAGNOSIS — J439 Emphysema, unspecified: Secondary | ICD-10-CM | POA: Diagnosis not present

## 2016-01-02 DIAGNOSIS — R911 Solitary pulmonary nodule: Secondary | ICD-10-CM | POA: Insufficient documentation

## 2016-01-02 DIAGNOSIS — C3491 Malignant neoplasm of unspecified part of right bronchus or lung: Secondary | ICD-10-CM

## 2016-01-02 DIAGNOSIS — J9811 Atelectasis: Secondary | ICD-10-CM | POA: Insufficient documentation

## 2016-01-02 DIAGNOSIS — I2510 Atherosclerotic heart disease of native coronary artery without angina pectoris: Secondary | ICD-10-CM | POA: Diagnosis not present

## 2016-01-02 DIAGNOSIS — Z08 Encounter for follow-up examination after completed treatment for malignant neoplasm: Secondary | ICD-10-CM | POA: Insufficient documentation

## 2016-01-02 DIAGNOSIS — K449 Diaphragmatic hernia without obstruction or gangrene: Secondary | ICD-10-CM | POA: Diagnosis not present

## 2016-01-02 DIAGNOSIS — Z85118 Personal history of other malignant neoplasm of bronchus and lung: Secondary | ICD-10-CM | POA: Insufficient documentation

## 2016-01-02 DIAGNOSIS — E876 Hypokalemia: Secondary | ICD-10-CM

## 2016-01-02 DIAGNOSIS — I313 Pericardial effusion (noninflammatory): Secondary | ICD-10-CM | POA: Diagnosis not present

## 2016-01-02 LAB — COMPREHENSIVE METABOLIC PANEL
ALK PHOS: 115 U/L (ref 40–150)
ALT: <9 U/L (ref 0–55)
AST: 16 U/L (ref 5–34)
Albumin: 3.8 g/dL (ref 3.5–5.0)
Anion Gap: 12 mEq/L — ABNORMAL HIGH (ref 3–11)
BUN: 12.4 mg/dL (ref 7.0–26.0)
CHLORIDE: 100 meq/L (ref 98–109)
CO2: 30 meq/L — AB (ref 22–29)
Calcium: 9.3 mg/dL (ref 8.4–10.4)
Creatinine: 1.3 mg/dL — ABNORMAL HIGH (ref 0.6–1.1)
EGFR: 44 mL/min/{1.73_m2} — AB (ref >90)
GLUCOSE: 173 mg/dL — AB (ref 70–140)
POTASSIUM: 3.1 meq/L — AB (ref 3.5–5.1)
SODIUM: 143 meq/L (ref 136–145)
Total Bilirubin: 1.05 mg/dL (ref 0.20–1.20)
Total Protein: 7.9 g/dL (ref 6.4–8.3)

## 2016-01-02 LAB — CBC WITH DIFFERENTIAL/PLATELET
BASO%: 0.3 % (ref 0.0–2.0)
Basophils Absolute: 0 10*3/uL (ref 0.0–0.1)
EOS ABS: 0.3 10*3/uL (ref 0.0–0.5)
EOS%: 2.6 % (ref 0.0–7.0)
HEMATOCRIT: 38.3 % (ref 34.8–46.6)
HGB: 12.5 g/dL (ref 11.6–15.9)
LYMPH#: 1.9 10*3/uL (ref 0.9–3.3)
LYMPH%: 19 % (ref 14.0–49.7)
MCH: 27.8 pg (ref 25.1–34.0)
MCHC: 32.6 g/dL (ref 31.5–36.0)
MCV: 85.2 fL (ref 79.5–101.0)
MONO#: 0.9 10*3/uL (ref 0.1–0.9)
MONO%: 8.6 % (ref 0.0–14.0)
NEUT#: 6.9 10*3/uL — ABNORMAL HIGH (ref 1.5–6.5)
NEUT%: 69.5 % (ref 38.4–76.8)
PLATELETS: 243 10*3/uL (ref 145–400)
RBC: 4.5 10*6/uL (ref 3.70–5.45)
RDW: 16.2 % — ABNORMAL HIGH (ref 11.2–14.5)
WBC: 10 10*3/uL (ref 3.9–10.3)

## 2016-01-02 MED ORDER — IOHEXOL 300 MG/ML  SOLN
75.0000 mL | Freq: Once | INTRAMUSCULAR | Status: AC | PRN
  Administered 2016-01-02: 75 mL via INTRAVENOUS

## 2016-01-02 MED ORDER — POTASSIUM CHLORIDE CRYS ER 20 MEQ PO TBCR: 20.0000 meq | EXTENDED_RELEASE_TABLET | Freq: Every day | ORAL | Status: DC

## 2016-01-02 NOTE — Progress Notes (Signed)
Quick Note:  Call patient with the result and order K dur 20 meq po qd X 7 days ______ 

## 2016-01-02 NOTE — Telephone Encounter (Signed)
-----   Message from [name], MD sent at 01/02/2016  2:38 PM EST ----- Call patient with the result and order K dur 20 meq po qd X 7 days.

## 2016-01-02 NOTE — Telephone Encounter (Signed)
Daughter notified 

## 2016-01-04 ENCOUNTER — Ambulatory Visit (HOSPITAL_BASED_OUTPATIENT_CLINIC_OR_DEPARTMENT_OTHER): Payer: Medicare Other | Admitting: Internal Medicine

## 2016-01-04 ENCOUNTER — Encounter: Payer: Self-pay | Admitting: Internal Medicine

## 2016-01-04 ENCOUNTER — Telehealth: Payer: Self-pay | Admitting: Internal Medicine

## 2016-01-04 VITALS — BP 154/78 | HR 88 | Temp 98.4°F | Resp 18 | Ht 65.0 in | Wt 135.7 lb

## 2016-01-04 DIAGNOSIS — C3431 Malignant neoplasm of lower lobe, right bronchus or lung: Secondary | ICD-10-CM

## 2016-01-04 DIAGNOSIS — C3491 Malignant neoplasm of unspecified part of right bronchus or lung: Secondary | ICD-10-CM

## 2016-01-04 NOTE — Telephone Encounter (Signed)
Gv pt appts for 12/26/16 + 01/02/17 and advised C-sched will call with ct appt.

## 2016-01-04 NOTE — Progress Notes (Signed)
[facility] [phone]   [phone]  OFFICE PROGRESS NOTE  Kandice Hams, MD [address] [place] [address]  DIAGNOSIS: Stage IA (T1b, N0, M0) non-small cell lung cancer consistent with adenocarcinoma involving the right lower lobe, diagnosed in September 2014.  PRIOR THERAPY: Status post curative stereotactic radiotherapy to the right lower lobe lung nodule under the care of Dr. Tammi Klippel completed on 10/29/2013.  CURRENT THERAPY: Observation.  INTERVAL HISTORY: Joan Hunter 82 y.o. female returns to the clinic today for six-month followup visit accompanied by her daughter. The patient is feeling fine today with no specific complaints except for shortness breath with exertion. She was seen recently by Dr. Melvyn Novas for persistent cough which was likely secondary to blood pressure medication and this was changed to Norvasc and she felt much better. The patient denied having any chest pain, cough or hemoptysis. She has no fever or chills, no nausea or vomiting. She denied having any significant weight loss or night sweats. She had repeat CT scan of the chest performed recently and she is here for evaluation and discussion of her scan results.  MEDICAL HISTORY: Past Medical History  Diagnosis Date  . CHF (congestive heart failure) ([facility])   . Hyperlipidemia   . Hypertension   . Arthritis   . Dilated cardiomyopathy ([facility])     now resolved, EF 60-65% echo 10/11/11 (Dr. Marlou Porch)  . CAD (coronary artery disease)     mod 2V CAD (60% LAD, 70-80% mid CX) by 01/2008 cath  . Lung mass   . Allergic rhinitis   . Diverticulosis   . Osteopenia   . MR (mitral regurgitation)   . Diabetes mellitus     dx 10 plus yrs ago    ALLERGIES:  is allergic to moxifloxacin; penicillins; and prednisone.  MEDICATIONS:  Current Outpatient Prescriptions  Medication Sig Dispense Refill  . amLODipine (NORVASC) 5 MG tablet Take 5 mg by mouth daily.      Marland Kitchen aspirin 81 MG tablet Take 81 mg by mouth daily.    Marland Kitchen atorvastatin (LIPITOR) 40 MG tablet     . famotidine (PEPCID AC) 10 MG chewable tablet Chew 10 mg by mouth at bedtime.    . furosemide (LASIX) 40 MG tablet Take 1 tablet (40 mg total) by mouth every other day. 30 tablet 6  . glipiZIDE (GLUCOTROL) 5 MG tablet Take 2.5 mg by mouth daily.    . mometasone-formoterol (DULERA) 100-5 MCG/ACT AERO Inhale 2 puffs into the lungs 2 (two) times daily.    . nebivolol (BYSTOLIC) 10 MG tablet Take 1 tablet (10 mg total) by mouth daily.    . pantoprazole (PROTONIX) 40 MG tablet     . potassium chloride SA (K-DUR,KLOR-CON) 20 MEQ tablet Take 1 tablet (20 mEq total) by mouth daily. 7 tablet 0  . PROVENTIL HFA 108 (90 Base) MCG/ACT inhaler Inhale 2 puffs into the lungs every 6 (six) hours as needed.     No current facility-administered medications for this visit.    SURGICAL HISTORY:  Past Surgical History  Procedure Laterality Date  . Abdominal hysterectomy    . Appendectomy    . Knee arthroscopy      ?  left  knee  . Eye surgery      bil cataracts  . Tonsillectomy      REVIEW OF SYSTEMS:  A comprehensive review of systems was negative except for: Musculoskeletal: positive for back pain  PHYSICAL EXAMINATION: General appearance: alert, cooperative and no distress Head: Normocephalic, without obvious abnormality, atraumatic Neck: no adenopathy, no JVD, supple, symmetrical, trachea midline and thyroid not enlarged, symmetric, no tenderness/mass/nodules Lymph nodes: Cervical, supraclavicular, and axillary nodes normal. Resp: clear to auscultation bilaterally Back: symmetric, no curvature. ROM normal. No CVA tenderness. Cardio: regular rate and rhythm, S1, S2 normal, no murmur, click, rub or gallop GI: soft, non-tender; bowel sounds normal; no masses,  no organomegaly Extremities: extremities normal, atraumatic, no cyanosis or edema  ECOG PERFORMANCE STATUS: 1 - Symptomatic but completely  ambulatory  Blood pressure 154/78, pulse 88, temperature 98.4 F (36.9 C), temperature source Oral, resp. rate 18, height '5\' 5"'$  (1.651 m), weight 135 lb 11.2 oz (61.553 kg), SpO2 98 %.  LABORATORY DATA: Lab Results  Component Value Date   WBC 10.0 01/02/2016   HGB 12.5 01/02/2016   HCT 38.3 01/02/2016   MCV 85.2 01/02/2016   PLT 243 01/02/2016      Chemistry      Component Value Date/Time   NA 143 01/02/2016 0902   NA 142 05/01/2015 1900   K 3.1* 01/02/2016 0902   K 2.9* 05/01/2015 1900   CL 98* 05/01/2015 1900   CO2 30* 01/02/2016 0902   CO2 26 02/10/2015 0611   BUN 12.4 01/02/2016 0902   BUN 15 05/01/2015 1900   CREATININE 1.3* 01/02/2016 0902   CREATININE 1.00 05/01/2015 1900   CREATININE 0.91 08/05/2013 1457      Component Value Date/Time   CALCIUM 9.3 01/02/2016 0902   CALCIUM 8.8 02/10/2015 0611   ALKPHOS 115 01/02/2016 0902   ALKPHOS 97 02/09/2015 0754   AST 16 01/02/2016 0902   AST 29 02/09/2015 0754   ALT <9 01/02/2016 0902   ALT 17 02/09/2015 0754   BILITOT 1.05 01/02/2016 0902   BILITOT 0.6 02/09/2015 0754       RADIOGRAPHIC STUDIES: Ct Chest W Contrast  01/02/2016  CLINICAL DATA:  Non-small cell right lung cancer, restaging. Radiation therapy completed in 2014. EXAM: CT CHEST WITH CONTRAST TECHNIQUE: Multidetector CT imaging of the chest was performed during intravenous contrast administration. CONTRAST:  103m OMNIPAQUE IOHEXOL 300 MG/ML  SOLN COMPARISON:  06/27/2015 FINDINGS: Mediastinum/Nodes: Atherosclerotic calcification of the aortic arch and coronary arteries. Stable small to moderate hiatal hernia. Stable small pericardial effusion most notable along the cardiac apex and inferior margin. A right paratracheal node measures 7 mm in short axis, unchanged, on image 21 series 2. No pathologic adenopathy identified. Lungs/Pleura: Emphysema. Bandlike atelectasis or scarring along the minor fissure and posterior portion of the major fissure on the right,  confluent to the right hilum, unchanged. Stable chronic mild nodularity in the right middle lobe, index nodule 4 mm in thickness on image 35 series 5, unchanged. The pleural-based right lower lobe nodule measures 1.0 by 1.1 cm, previously 1.2 by 1.2 cm. A small nodule along the left major fissure measures 4 mm in diameter, stable. Mild scarring in the lingula noted. Upper abdomen: Upper normal sized common hepatic duct. Musculoskeletal: No change in the fractures at T5 and T6. IMPRESSION: 1. The right lower lobe pulmonary nodule has been chronically stable and currently measures 1.1 by 1.0 cm. Not appreciably changed from 2009. 2. Stable scarring along the minor fissure and posterior portion of the major fissure on the right. Stable scarring in the lingula. 3. Stable chronic nodularity in the right middle lobe and along the left major fissure, has been present back through 2009. 4. No change in compression  fractures at T5 and T6. 5. Coronary atherosclerosis. 6. Small to moderate sized hiatal hernia. 7. Emphysema. Electronically Signed   By: Van Clines M.D.   On: 01/02/2016 11:51   ASSESSMENT AND PLAN: This is a very pleasant 82 years old [place] American female with history of stage IA non-small cell lung cancer involving the right lower lobe status post curative stereotactic radiotherapy completed in November 2014 and she is currently on observation.  Her recent CT scan of the chest showed stable disease with stable right lower lobe pulmonary nodule. I discussed the scan resultswith the patient and her daughter. I recommended for her to continue on observation with repeat CT scan of the chest in one year. She was advised to call immediately if she has any concerning symptoms in the interval.  The patient voices understanding of current disease status and treatment options and is in agreement with the current care plan.  All questions were answered. The patient knows to call the clinic with any  problems, questions or concerns. We can certainly see the patient much sooner if necessary.  Disclaimer: This note was dictated with voice recognition software. Similar sounding words can inadvertently be transcribed and may not be corrected upon review.

## 2016-01-16 ENCOUNTER — Encounter: Payer: Self-pay | Admitting: Internal Medicine

## 2016-01-16 ENCOUNTER — Ambulatory Visit (INDEPENDENT_AMBULATORY_CARE_PROVIDER_SITE_OTHER): Payer: Medicare Other | Admitting: Internal Medicine

## 2016-01-16 VITALS — BP 132/84 | HR 99 | Ht 64.0 in | Wt 138.0 lb

## 2016-01-16 DIAGNOSIS — R05 Cough: Secondary | ICD-10-CM

## 2016-01-16 DIAGNOSIS — I10 Essential (primary) hypertension: Secondary | ICD-10-CM | POA: Diagnosis not present

## 2016-01-16 DIAGNOSIS — R059 Cough, unspecified: Secondary | ICD-10-CM

## 2016-01-16 MED ORDER — FAMOTIDINE 20 MG PO TABS: ORAL_TABLET | ORAL | Status: DC

## 2016-01-16 MED ORDER — BISOPROLOL FUMARATE 10 MG PO TABS: 10.0000 mg | ORAL_TABLET | Freq: Every day | ORAL | Status: DC

## 2016-01-16 NOTE — Progress Notes (Signed)
Subjective:    Patient ID: Joan Hunter, female    DOB: [birth year]    MRN: [id number]    Brief patient profile:  25 yobf quit smoking 1973 dx non-small cell lung cancer involving the right lower lobe status post curative stereotactic radiotherapy completed in November 2014 and she is currently on observation by Dr Mohammed/ RT referred to pulmonary clinic 12/26/2015 by Dr [name]  for sob and cough    History of Present Illness  12/26/2015 1st [facility] office visit/ [facility]   Chief Complaint  Patient presents with  . PULMONARY CONSULT    Referred by Dr. [name]. Pt c/o chronic dry cough and SOB with some wheeze. Pt has been told she has COPD but is not sure.   cough started p RT, dry , worse since dec 2016 to point of loses voice at rest/ better if not using voice/ comfortable at rest and hs Some better with narc cough syrups/ no better on dulera or saba to date  rec Pantoprazole (protonix) 40 mg   Take  30-60 min before first meal of the day and Pepcid (famotidine)  20 mg one @  bedtime until return to office   GERD diet Stop corevidol and start bystolic10 mg  daily       01/16/2016  f/u ov/[facility] re: chronic cough/ sp rt for lung ca  Chief Complaint  Patient presents with  . Follow-up    Cough has improved some. Still has occ wheezing.   rare need for saba / overall satisfied better since last ov   No obvious day to day or daytime variability or assoc excess/ purulent sputum or mucus plugs or hemoptysis or cp or chest tightness, subjective wheeze or overt sinus or hb symptoms. No unusual exp hx or h/o childhood pna/ asthma or knowledge of premature birth.  Sleeping ok without nocturnal  or early am exacerbation  of respiratory  c/o's or need for noct saba. Also denies any obvious fluctuation of symptoms with weather or environmental changes or other aggravating or alleviating factors except as outlined above   Current Medications, Allergies, Complete Past Medical History,  Past Surgical History, Family History, and Social History were reviewed in [employer] record.  ROS  The following are not active complaints unless bolded sore throat, dysphagia, dental problems, itching, sneezing,  nasal congestion or excess/ purulent secretions, ear ache,   fever, chills, sweats, unintended wt loss, classically pleuritic or exertional cp,  orthopnea pnd or leg swelling, presyncope, palpitations, abdominal pain, anorexia, nausea, vomiting, diarrhea  or change in bowel or bladder habits, change in stools or urine, dysuria,hematuria,  rash, arthralgias, visual complaints, headache, numbness, weakness or ataxia or problems with walking or coordination,  change in mood/affect or memory.                    Objective:   Physical Exam  amb stoic bf nad   01/16/2016       138  12/26/15 137 lb (62.143 kg)  11/21/15 137 lb 12.8 oz (62.506 kg)  08/08/15 140 lb 8 oz (63.73 kg)    Vital signs reviewed    HEENT: nl turbinates, and oropharynx. Nl external ear canals without cough reflex  - full set of dentures    NECK :  without JVD/Nodes/TM/ nl carotid upstrokes bilaterally   LUNGS: no acc muscle use,  Nl contour chest / very minimal bilateral rhonchi    CV:  RRR  no s3 or  murmur or increase in P2, no edema   ABD:  soft and nontender with nl inspiratory excursion in the supine position. No bruits or organomegaly, bowel sounds nl  MS:  Nl gait/ ext warm without deformities, calf tenderness, cyanosis or clubbing No obvious joint restrictions   SKIN: warm and dry without lesions    NEURO:  alert, approp, nl sensorium with  no motor deficits     I personally reviewed images and agree with radiology impression as follows:  CT Chest  01/02/16 1. The right lower lobe pulmonary nodule has been chronically stable and currently measures 1.1 by 1.0 cm. Not appreciably changed from 2009. 2. Stable scarring along the minor fissure and posterior portion  of the major fissure on the right. Stable scarring in the lingula. 3. Stable chronic nodularity in the right middle lobe and along the left major fissure, has been present back through 2009. 4. No change in compression fractures at T5 and T6. 5. Coronary atherosclerosis. 6. Small to moderate sized hiatal hernia. 7. Emphysema.       Labs ordered 12/26/2015  Lab Results  Component Value Date   ESRSEDRATE 31* 12/26/2015    Lab Results  Component Value Date   WBC 9.6 12/26/2015   HGB 13.2 12/26/2015   HCT 40.6 12/26/2015   MCV 84.3 12/26/2015   PLT 282.0 12/26/2015        Assessment & Plan:

## 2016-01-16 NOTE — Patient Instructions (Addendum)
bisoprol 10 mg daily in place of bystolic when it runs out  - if this medications is too strong break in half and just take a half daily   Continue Pantoprazole (protonix) 40 mg   Take  30-60 min before first meal of the day and Pepcid (famotidine)  20 mg one @  bedtime until cough gone for a month then ok to try off and if cough flares you may need to be referred to a GI doctor but I'll let your primary care doctor make that call   If you are satisfied with your treatment plan,  let your doctor know and he/she can either refill your medications or you can return here when your prescription runs out.     If in any way you are not 100% satisfied,  please tell us.  If 100% better, tell your friends!  Pulmonary follow up is as needed

## 2016-01-18 ENCOUNTER — Encounter: Payer: Self-pay | Admitting: Internal Medicine

## 2016-01-18 NOTE — Assessment & Plan Note (Addendum)
Onset p completed RT Nov 2014  - Allergy profile 12/27/15 >  Eos 0.3/  IgE 2368 no sign pos RAST   Cough and wheeze both better to her satisfaction off all maint rx/ min need for saba so doubt cough variant asthma or sign copd here    I had an extended final summary discussion with the patient reviewing all relevant studies completed to date and  lasting 15 to 20 minutes of a 25 minute visit on the following issues:    Not clear what's causing the hyper IgE but does not appear to have sign atopy clinically nor much asthma now that she's on specific BB so pulmonary f/u can be prn with pfts if not doing as well over time with cough/ sob or "wheezing"

## 2016-01-18 NOTE — Assessment & Plan Note (Signed)
Changed coreg to bystolic 7/82/4235 due to concerns re cough ? Asthma related > improved 01/16/2016 rec change to bisroprolol 10 mg daily and f/u primary care

## 2016-03-20 ENCOUNTER — Other Ambulatory Visit: Payer: Self-pay

## 2016-03-20 ENCOUNTER — Other Ambulatory Visit: Payer: Self-pay | Admitting: Cardiology

## 2016-03-20 DIAGNOSIS — Z1231 Encounter for screening mammogram for malignant neoplasm of breast: Secondary | ICD-10-CM

## 2016-04-01 ENCOUNTER — Ambulatory Visit (INDEPENDENT_AMBULATORY_CARE_PROVIDER_SITE_OTHER): Payer: Medicare Other | Admitting: Cardiology

## 2016-04-01 ENCOUNTER — Encounter: Payer: Self-pay | Admitting: Cardiology

## 2016-04-01 VITALS — BP 140/92 | HR 88 | Ht 64.0 in | Wt 144.0 lb

## 2016-04-01 DIAGNOSIS — I340 Nonrheumatic mitral (valve) insufficiency: Secondary | ICD-10-CM

## 2016-04-01 DIAGNOSIS — J8410 Pulmonary fibrosis, unspecified: Secondary | ICD-10-CM

## 2016-04-01 DIAGNOSIS — I10 Essential (primary) hypertension: Secondary | ICD-10-CM | POA: Diagnosis not present

## 2016-04-01 DIAGNOSIS — I2583 Coronary atherosclerosis due to lipid rich plaque: Principal | ICD-10-CM

## 2016-04-01 DIAGNOSIS — I2510 Atherosclerotic heart disease of native coronary artery without angina pectoris: Secondary | ICD-10-CM | POA: Diagnosis not present

## 2016-04-01 DIAGNOSIS — I444 Left anterior fascicular block: Secondary | ICD-10-CM

## 2016-04-01 NOTE — Progress Notes (Signed)
[facility]. [address], [address] [place], [place]  [address] Phone: [phone] Fax:  [phone]  Date:  04/01/2016   ID:  Joan Hunter, DOB 1926/12/24, MRN [id number]  PCP:  Kandice Hams, MD   History of Present Illness: Joan Hunter is a 82 y.o. female with moderate coronary artery disease prior catheterization in 2009, prior idiopathic cardiomyopathy (EF was 15%) now resolved, mitral regurgitation mild here for followup. 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 Dr. Tammi Klippel. Radiation only.   Dr. Melvyn Novas changed Coreg to Bystolic to bisoprolol. COPD/bronchitis. Treating hiatal hernia with Protonix as well.   Continuing with atorvastatin, hyperlipidemia. Minimal shortness of breath with activity. Encouraged activity. Weight slightly increased  She's not having any chest discomfort.   She experienced some dizziness when standing and her medications were previously reduced. This has improved.  Her daughter, [name], helps her out significantly but she also make sure that she gets up and moves around adequately.    Wt Readings from Last 3 Encounters:  04/01/16 144 lb (65.318 kg)  01/16/16 138 lb (62.596 kg)  01/04/16 135 lb 11.2 oz (61.553 kg)     Past Medical History  Diagnosis Date  . CHF (congestive heart failure) ([place])   . Hyperlipidemia   . Hypertension   . Arthritis   . Dilated cardiomyopathy ([place])     now resolved, EF 60-65% echo 10/11/11 (Dr. Marlou Porch)  . CAD (coronary artery disease)     mod 2V CAD (60% LAD, 70-80% mid CX) by 01/2008 cath  . Lung mass   . Allergic rhinitis   . Diverticulosis   . Osteopenia   . MR (mitral regurgitation)   . Diabetes mellitus     dx 10 plus yrs ago    Past Surgical History  Procedure Laterality Date  . Abdominal hysterectomy    . Appendectomy    . Knee arthroscopy      ?  left  knee  . Eye surgery      bil cataracts  .  Tonsillectomy      Current Outpatient Prescriptions  Medication Sig Dispense Refill  . amLODipine (NORVASC) 5 MG tablet Take 5 mg by mouth daily.    Marland Kitchen aspirin 81 MG tablet Take 81 mg by mouth daily.    Marland Kitchen atorvastatin (LIPITOR) 40 MG tablet Take 40 mg by mouth daily at 6 PM.     . bisoprolol (ZEBETA) 10 MG tablet Take 1 tablet (10 mg total) by mouth daily. 30 tablet 11  . famotidine (PEPCID) 20 MG tablet One at bedtime    . furosemide (LASIX) 40 MG tablet Take 1 tablet (40 mg total) by mouth every other day. 15 tablet 6  . glipiZIDE (GLUCOTROL) 5 MG tablet Take 2.5 mg by mouth daily.    . pantoprazole (PROTONIX) 40 MG tablet Take 40 mg by mouth daily.      No current facility-administered medications for this visit.    Allergies:    Allergies  Allergen Reactions  . Moxifloxacin     REACTION: hives  . Penicillins     REACTION: hives  . Prednisone     REACTION: hives    Social History:  The patient  reports that she quit smoking about 43 years ago. Her smoking use included Cigarettes. She smoked 0.50 packs per day. She does not have any smokeless tobacco history on file. She reports that she  does not drink alcohol or use illicit drugs.   ROS:  Please see the history of present illness. Positive for cough, shortness of breath with activity, back pain, easy bruising, snoring, wheezing, balance, fatigue Denies any syncope, bleeding, orthopnea, PND   PHYSICAL EXAM: VS:  BP 140/92 mmHg  Pulse 88  Ht '5\' 4"'$  (1.626 m)  Wt 144 lb (65.318 kg)  BMI 24.71 kg/m2 Well nourished, well developed, in no acute distress, walks with cane, slowed down. HEENT: normal Neck: no JVD Cardiac:  normal S1, S2; RRR; no obvious murmur Lungs:  Mild wheeze heard left lower lobe, somewhat clearing with cough  Abd: soft, nontender, no hepatomegaly Ext: no edema Skin: warm and dry Neuro: no focal abnormalities noted  Creatinine 0.7 on 10/14, liver functions normal.  EKG:  EKG ordered today. 04/01/16-sinus  rhythm, 88, left anterior fascicular block, borderline LVH, nonspecific ST-T wave changes personally viewed-no significant change from prior-12/02/13-will rhythm, sinus arrhythmia, left axis deviation/left anterior fascicular block, LVH, nonspecific ST changes, heart rate 73    ECHO 2012:   1. Normal LV size and function. 2. There were no regional wall motion abnormalities. 3. Left ventricular ejection fraction estimated by 2D at 60-65 percent. 4. Mild mitral valve regurgitation. 5. Trivial tricuspid regurgitation.  Labs: 06/27/15-creatinine 1.0, potassium 4.2, hemoglobin of 11.8  CT scan on 06/27/15 showed small to moderate hiatal hernia, emphysema, trivial/small pericardial effusion.   ASSESSMENT AND PLAN:  1. Left ventricular systolic dysfunction-previous EF 15%, now normal/now resolved, doing well. Continue to encourage activity.  2. Non-small cell cancer of right lung, stage IA-Dr. Tammi Klippel, Dr. Melvyn Novas. Radiation. Formally known as BAC. Dr. Tammi Klippel, doing well 3. Pulmonary fibrosis-Dr. Melvyn Novas. Changed Coreg to bystolic to bisoprolol. Pepcid. because of cough. Improved. Last hospitalization-September 2016 with inflammatory state.  4. Mitral regurgitation-previously characterized as severe but now mild. Doing well. 5. Hypertension-today 142/90. Currently reasonable controlled.  Off coreg because of cough. Prior symptoms of orthostatic hypotension seemed to have resolved. She is doing better with decrease in medication.  6. Edema - QOD lasix, well controlled.  7. Left anterior fascicular block-chronic, unchanged, no syncope 8. 4 month follow up. (Daughter requests)   Signed, Candee Furbish, MD [facility]  04/01/2016 8:37 AM

## 2016-04-01 NOTE — Patient Instructions (Signed)
Medication Instructions:  The current medical regimen is effective;  continue present plan and medications.  Follow-Up: Follow up in 4 months with Dr Skains.  If you need a refill on your cardiac medications before your next appointment, please call your pharmacy.  Thank you for choosing [facility]!!     

## 2016-04-09 ENCOUNTER — Ambulatory Visit
Admission: RE | Admit: 2016-04-09 | Discharge: 2016-04-09 | Disposition: A | Discharge status: Home / Self Care | Payer: Medicare Other | Source: Ambulatory Visit

## 2016-04-09 DIAGNOSIS — Z1231 Encounter for screening mammogram for malignant neoplasm of breast: Secondary | ICD-10-CM

## 2016-04-11 ENCOUNTER — Other Ambulatory Visit: Payer: Self-pay | Admitting: Internal Medicine

## 2016-04-11 DIAGNOSIS — R928 Other abnormal and inconclusive findings on diagnostic imaging of breast: Secondary | ICD-10-CM

## 2016-04-12 ENCOUNTER — Other Ambulatory Visit: Payer: Self-pay | Admitting: Internal Medicine

## 2016-04-12 ENCOUNTER — Other Ambulatory Visit: Payer: Self-pay | Admitting: Medical Oncology

## 2016-04-12 ENCOUNTER — Telehealth: Payer: Self-pay | Admitting: Medical Oncology

## 2016-04-12 DIAGNOSIS — R928 Other abnormal and inconclusive findings on diagnostic imaging of breast: Secondary | ICD-10-CM

## 2016-04-12 NOTE — Telephone Encounter (Signed)
I returned call to  [facility] about a fax . I left a message that  I do not think Joan Hunter ordered this because I find no information about it and to please recheck to see if it needs to go to PCP-Dr Polite.

## 2016-04-16 ENCOUNTER — Ambulatory Visit
Admission: RE | Admit: 2016-04-16 | Discharge: 2016-04-16 | Disposition: A | Discharge status: Home / Self Care | Payer: Medicare Other | Source: Ambulatory Visit | Attending: Internal Medicine | Admitting: Internal Medicine

## 2016-04-16 DIAGNOSIS — R928 Other abnormal and inconclusive findings on diagnostic imaging of breast: Secondary | ICD-10-CM

## 2016-08-05 ENCOUNTER — Encounter: Payer: Self-pay | Admitting: Cardiology

## 2016-08-05 ENCOUNTER — Ambulatory Visit (INDEPENDENT_AMBULATORY_CARE_PROVIDER_SITE_OTHER): Payer: Medicare Other | Admitting: Cardiology

## 2016-08-05 VITALS — BP 128/80 | HR 100 | Ht 65.0 in | Wt 142.0 lb

## 2016-08-05 DIAGNOSIS — J8410 Pulmonary fibrosis, unspecified: Secondary | ICD-10-CM | POA: Diagnosis not present

## 2016-08-05 DIAGNOSIS — I2510 Atherosclerotic heart disease of native coronary artery without angina pectoris: Secondary | ICD-10-CM | POA: Diagnosis not present

## 2016-08-05 DIAGNOSIS — I2583 Coronary atherosclerosis due to lipid rich plaque: Principal | ICD-10-CM

## 2016-08-05 DIAGNOSIS — C3491 Malignant neoplasm of unspecified part of right bronchus or lung: Secondary | ICD-10-CM | POA: Diagnosis not present

## 2016-08-05 DIAGNOSIS — I340 Nonrheumatic mitral (valve) insufficiency: Secondary | ICD-10-CM | POA: Diagnosis not present

## 2016-08-05 DIAGNOSIS — I10 Essential (primary) hypertension: Secondary | ICD-10-CM

## 2016-08-05 DIAGNOSIS — I444 Left anterior fascicular block: Secondary | ICD-10-CM

## 2016-08-05 NOTE — Patient Instructions (Signed)
Medication Instructions:  The current medical regimen is effective;  continue present plan and medications.  Follow-Up: Follow up in  4 months with Bonney Leitz, PA and 8 months with Dr Marlou Porch.  Any Other Special Instructions Will Be Listed Below (If Applicable).  If you need a refill on your cardiac medications before your next appointment, please call your pharmacy.  Thank you for choosing [facility]!!

## 2016-08-05 NOTE — Progress Notes (Signed)
[facility]. [address], [place]  [address] Phone: [phone] Fax:  [phone]  Date:  08/05/2016   ID:  Joan Hunter, DOB [birth year], MRN [id number]  PCP:  Kandice Hams, MD   History of Present Illness: Joan Hunter is a 82 y.o. female with moderate coronary artery disease prior catheterization in 2009, prior idiopathic cardiomyopathy (EF was 15%) now resolved, mitral regurgitation mild here for followup. 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 Dr. Tammi Klippel. Radiation only.   Dr. Melvyn Novas changed Coreg to Bystolic to bisoprolol. COPD/bronchitis. Treating hiatal hernia with Protonix as well.   Continuing with atorvastatin, hyperlipidemia. Minimal shortness of breath with activity. Encouraged activity. Weight slightly increased  She's not having any chest discomfort. No syncope. No bleeding.   She experienced some dizziness when standing and her medications were previously reduced. This has improved.  Her daughter, [name], helps her out significantly but she also make sure that she gets up and moves around adequately.   I encouraged her to use her walker. She has a split-level house. Concerned about stair use.   She has been seeing Dr. [name].   Wt Readings from Last 3 Encounters:  08/05/16 142 lb (64.4 kg)  04/01/16 144 lb (65.3 kg)  01/16/16 138 lb (62.6 kg)     Past Medical History:  Diagnosis Date  . Allergic rhinitis   . Arthritis   . CAD (coronary artery disease)    mod 2V CAD (60% LAD, 70-80% mid CX) by 01/2008 cath  . CHF (congestive heart failure) ([place])   . Diabetes mellitus    dx 10 plus yrs ago  . Dilated cardiomyopathy ([place])    now resolved, EF 60-65% echo 10/11/11 (Dr. Marlou Porch)  . Diverticulosis   . Hyperlipidemia   . Hypertension   . Lung mass   . MR (mitral regurgitation)   . Osteopenia     Past Surgical History:  Procedure Laterality  Date  . ABDOMINAL HYSTERECTOMY    . APPENDECTOMY    . EYE SURGERY     bil cataracts  . KNEE ARTHROSCOPY     ?  left  knee  . TONSILLECTOMY      Current Outpatient Prescriptions  Medication Sig Dispense Refill  . amLODipine (NORVASC) 5 MG tablet Take 5 mg by mouth daily.    Marland Kitchen aspirin 81 MG tablet Take 81 mg by mouth daily.    Marland Kitchen atorvastatin (LIPITOR) 40 MG tablet Take 40 mg by mouth daily at 6 PM.     . bisoprolol (ZEBETA) 10 MG tablet Take 1 tablet (10 mg total) by mouth daily. 30 tablet 11  . furosemide (LASIX) 40 MG tablet Take 1 tablet (40 mg total) by mouth every other day. 15 tablet 6  . glipiZIDE (GLUCOTROL) 5 MG tablet Take 2.5 mg by mouth daily.    . pantoprazole (PROTONIX) 40 MG tablet Take 40 mg by mouth daily.      No current facility-administered medications for this visit.     Allergies:    Allergies  Allergen Reactions  . Moxifloxacin     REACTION: hives  . Penicillins     REACTION: hives  . Prednisone     REACTION: hives    Social History:  The patient  reports that she quit smoking about 44 years ago. Her smoking use included Cigarettes. She smoked 0.50 packs per day. She does not  have any smokeless tobacco history on file. She reports that she does not drink alcohol or use drugs.   ROS:  Please see the history of present illness. Positive for cough, shortness of breath with activity, back pain, easy bruising, snoring, wheezing, balance, fatigue Denies any syncope, bleeding, orthopnea, PND   PHYSICAL EXAM: VS:  BP 128/80   Pulse 100   Ht '5\' 5"'$  (1.651 m)   Wt 142 lb (64.4 kg)   BMI 23.63 kg/m  Well nourished, well developed, in no acute distress, walks with cane, slowed down.  HEENT: normal  Neck: no JVD  Cardiac:  normal S1, S2; RRR; no obvious murmur  Lungs:  Mild wheeze heard left lower lobe, somewhat clearing with cough  Abd: soft, nontender, no hepatomegaly  Ext: no edema  Skin: warm and dry  Neuro: no focal abnormalities noted  Creatinine  0.7 on 10/14, liver functions normal.  EKG:  04/01/16-sinus rhythm, 88, left anterior fascicular block, borderline LVH, nonspecific ST-T wave changes personally viewed-no significant change from prior-12/02/13-will rhythm, sinus arrhythmia, left axis deviation/left anterior fascicular block, LVH, nonspecific ST changes, heart rate 73    ECHO 2012:   1. Normal LV size and function. 2. There were no regional wall motion abnormalities. 3. Left ventricular ejection fraction estimated by 2D at 60-65 percent. 4. Mild mitral valve regurgitation. 5. Trivial tricuspid regurgitation.  Labs: 06/27/15-creatinine 1.0, potassium 4.2, hemoglobin of 11.8  CT scan on 06/27/15 showed small to moderate hiatal hernia, emphysema, trivial/small pericardial effusion.   ASSESSMENT AND PLAN:  1. Left ventricular systolic dysfunction-previous EF 15%, now normal/now resolved with ejection fraction of 65%, doing well. Continue to encourage activity.  2. Non-small cell cancer of right lung, stage IA-Dr. Tammi Klippel, Dr. Melvyn Novas. Radiation. Formally known as BAC. Dr. Tammi Klippel, doing well. Curative. Dr. Earlie Server is watching. 3. Pulmonary fibrosis-Dr. Melvyn Novas. Changed Coreg to bystolic to bisoprolol. Pepcid. because of cough. Improved. Last hospitalization-September 2016 with inflammatory state.  4. Mitral regurgitation-previously characterized as severe (in the setting of transient cardiomyopathy likely secondary to malcoaptation) but now mild. Doing well. 5. Hypertension- Currently reasonable controlled.  Off coreg because of cough. Prior symptoms of orthostatic hypotension seemed to have resolved. She is doing better with decrease in medication.  6. Edema - QOD lasix, well controlled.  7. Left anterior fascicular block-chronic, unchanged, no syncope 8. 4 month follow up. (Daughter requests). Nell Range PA then me in 8 months.    Signed, Candee Furbish, MD [facility]  08/05/2016 9:07 AM

## 2016-10-12 IMAGING — CT CT CHEST W/ CM
2 of 4 series · 15 of 36 positions shown, 18 images · IV contrast (OMNIPAQUE)
Comparison: 06/27/2015

CLINICAL DATA: Non-small cell right lung cancer, restaging.
Radiation therapy completed in 6078.

EXAM:
CT CHEST WITH CONTRAST
TECHNIQUE: Multidetector CT imaging of the chest was performed during
intravenous contrast administration.
CONTRAST:  75mL OMNIPAQUE IOHEXOL 300 MG/ML  SOLN

[Series 2: rtn chest with st · axial · 0.66mm/px · z∈[-280,-26]mm · 12 of 61 slices shown, 15 images]
[im 5/61  mediastinal]
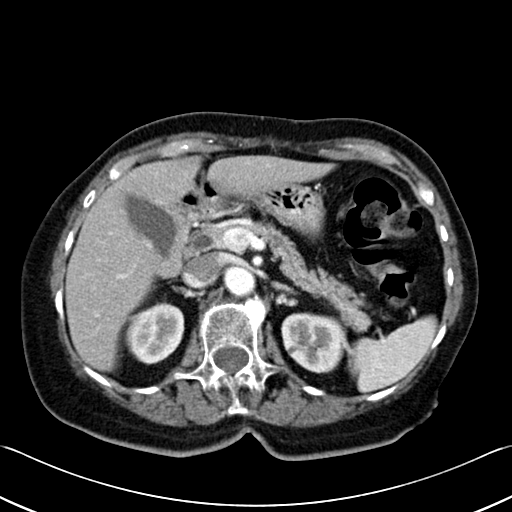
[im 5/61  lung]
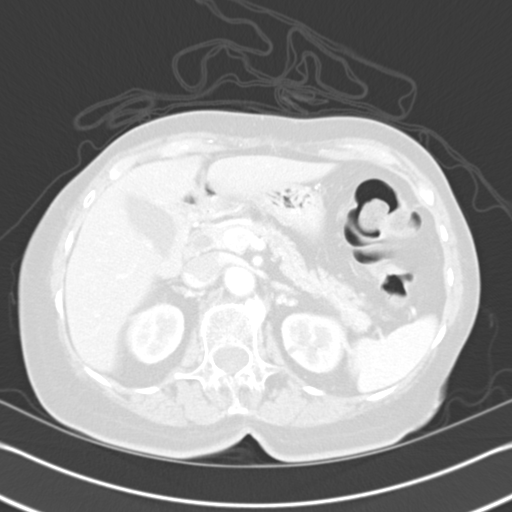
[im 10/61  lung]
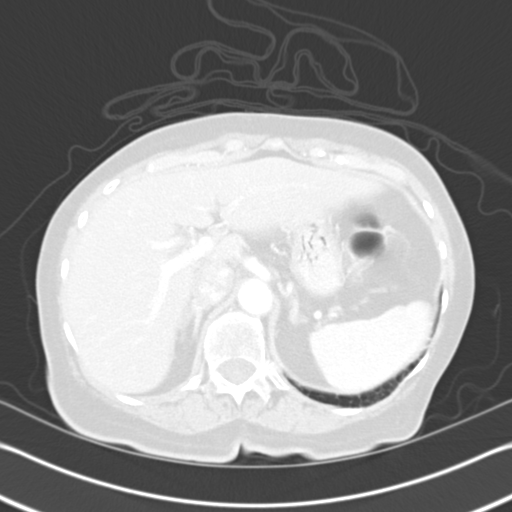
[im 14/61  lung]
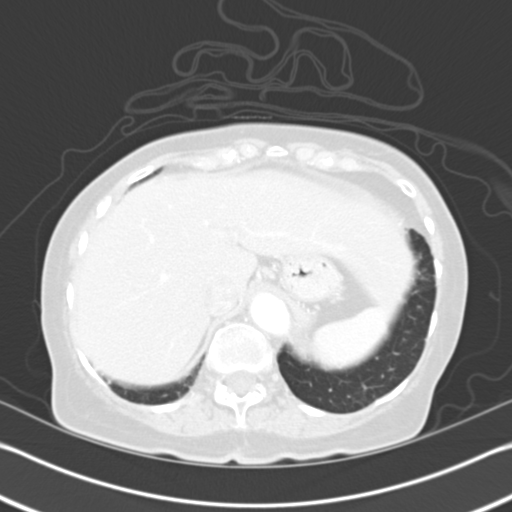
[im 19/61  lung]
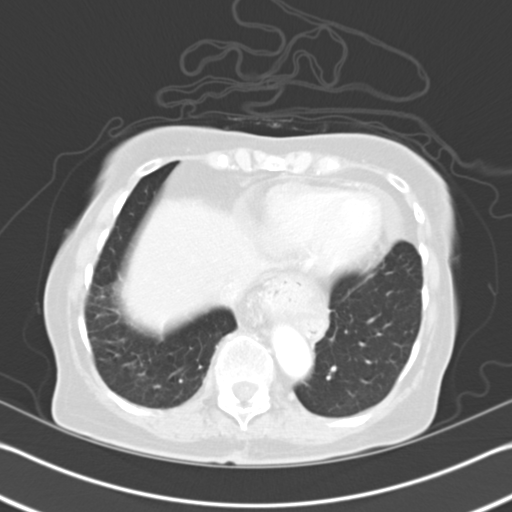
[im 24/61  mediastinal]
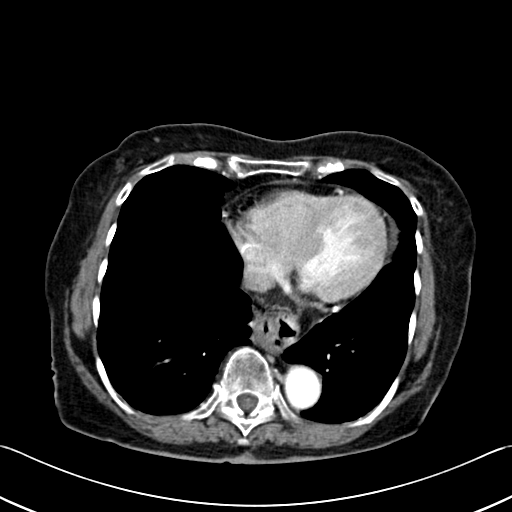
[im 24/61  lung]
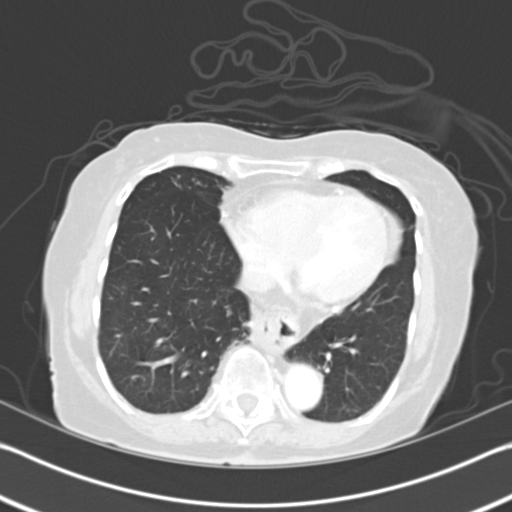
[im 28/61  lung]
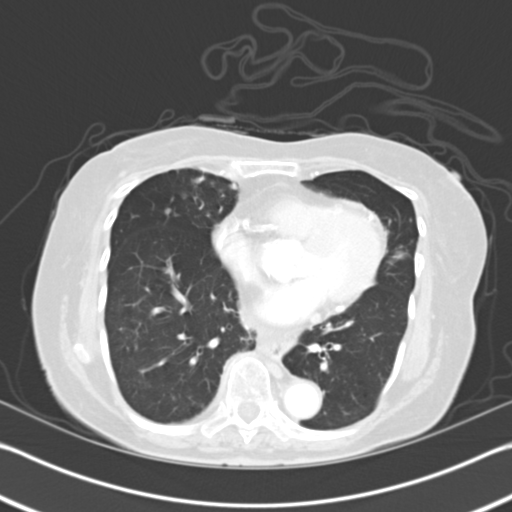
[im 33/61  lung]
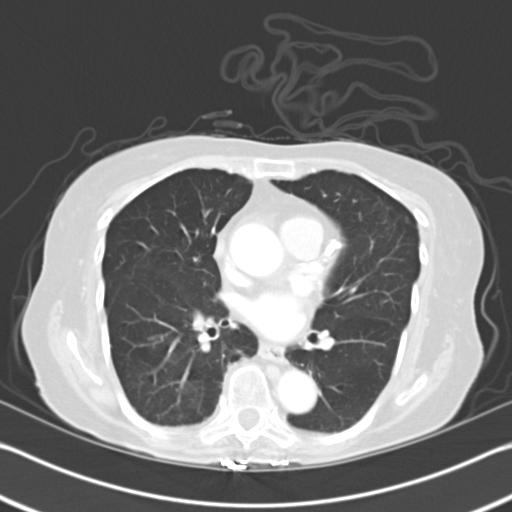
[im 37/61  lung]
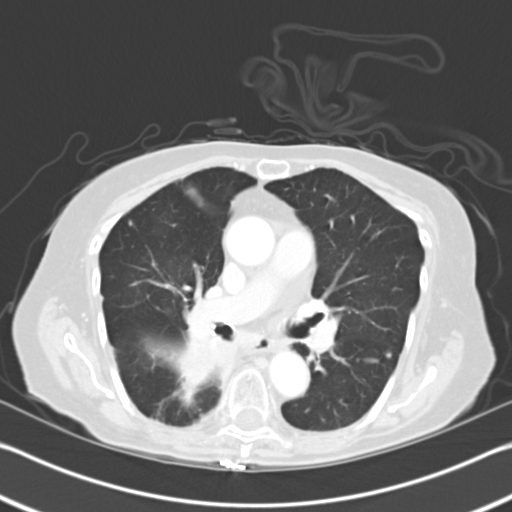
[im 42/61  mediastinal]
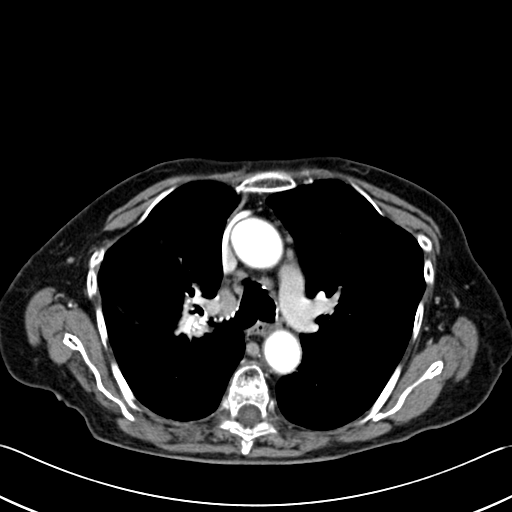
[im 42/61  lung]
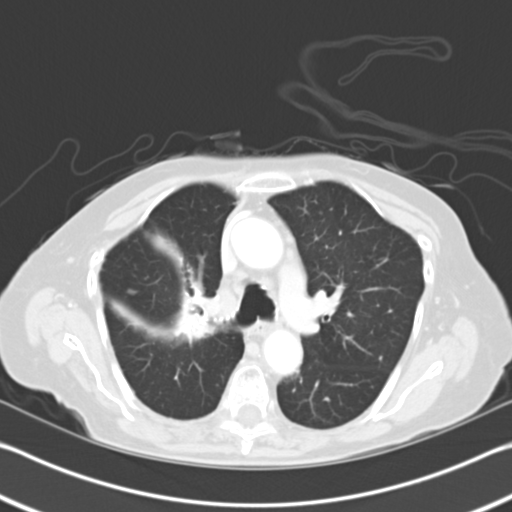
[im 47/61  lung]
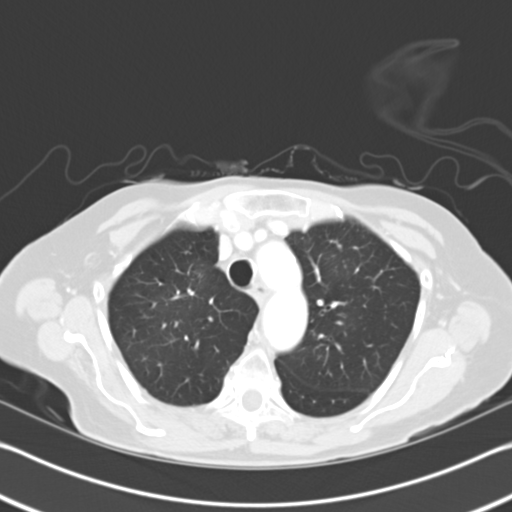
[im 51/61  lung]
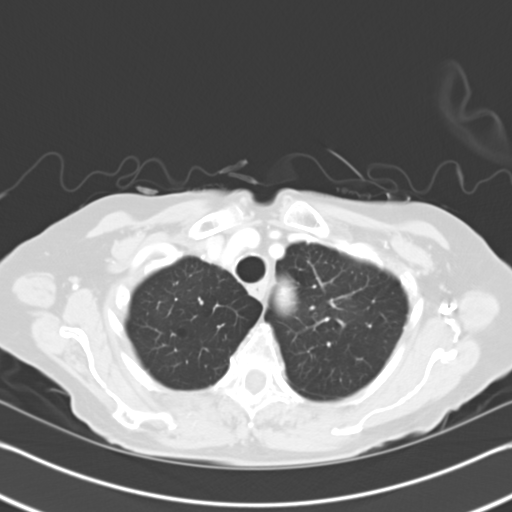
[im 56/61  lung]
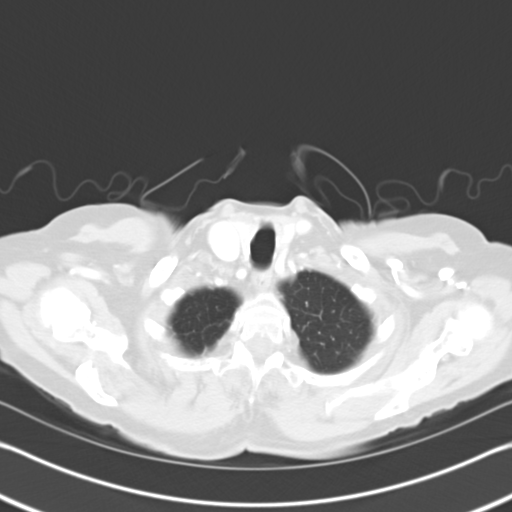

[Series 602: cor · coronal · 0.66mm/px · 3 of 74 slices shown]
[im 15/74  lung]
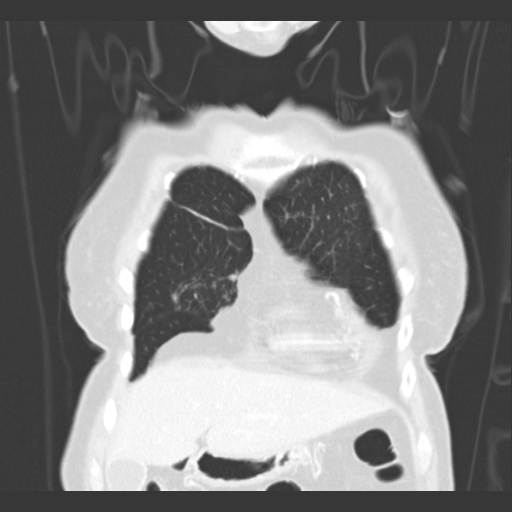
[im 30/74  lung]
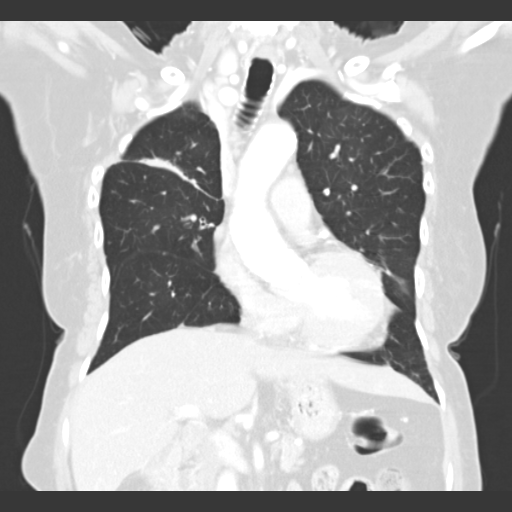
[im 44/74  lung]
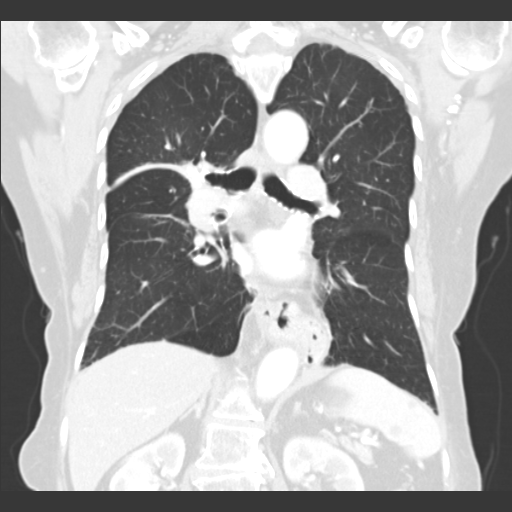

[15 of 36 positions shown; findings below may reference images not displayed]

FINDINGS: Mediastinum/Nodes: Atherosclerotic calcification of the aortic arch
and coronary arteries.

Stable small to moderate hiatal hernia. Stable small pericardial
effusion most notable along the cardiac apex and inferior margin. A
right paratracheal node measures 7 mm in short axis, unchanged, on
image 21 series 2. No pathologic adenopathy identified.

Lungs/Pleura: Emphysema. Bandlike atelectasis or scarring along the
minor fissure and posterior portion of the major fissure on the
right, confluent to the right hilum, unchanged. Stable chronic mild
nodularity in the right middle lobe, index nodule 4 mm in thickness
on image 35 series 5, unchanged. The pleural-based right lower lobe
nodule measures 1.0 by 1.1 cm, previously 1.2 by 1.2 cm. A small
nodule along the left major fissure measures 4 mm in diameter,
stable. Mild scarring in the lingula noted.

Upper abdomen: Upper normal sized common hepatic duct.

Musculoskeletal: No change in the fractures at T5 and T6.
IMPRESSION: 1. The right lower lobe pulmonary nodule has been chronically stable
and currently measures 1.1 by 1.0 cm. Not appreciably changed from
6115.
2. Stable scarring along the minor fissure and posterior portion of
the major fissure on the right. Stable scarring in the lingula.
3. Stable chronic nodularity in the right middle lobe and along the
left major fissure, has been present back through 6115.
4. No change in compression fractures at T5 and T6.
5. Coronary atherosclerosis.
6. Small to moderate sized hiatal hernia.
7. Emphysema.

## 2016-10-15 ENCOUNTER — Other Ambulatory Visit: Payer: Self-pay | Admitting: Internal Medicine

## 2016-10-15 DIAGNOSIS — N6310 Unspecified lump in the right breast, unspecified quadrant: Secondary | ICD-10-CM

## 2016-10-22 ENCOUNTER — Ambulatory Visit
Admission: RE | Admit: 2016-10-22 | Discharge: 2016-10-22 | Disposition: A | Discharge status: Home / Self Care | Payer: Medicare Other | Source: Ambulatory Visit | Attending: Internal Medicine | Admitting: Internal Medicine

## 2016-10-22 DIAGNOSIS — N6310 Unspecified lump in the right breast, unspecified quadrant: Secondary | ICD-10-CM

## 2016-11-01 ENCOUNTER — Encounter: Payer: Self-pay | Admitting: Nurse Practitioner

## 2016-11-13 ENCOUNTER — Ambulatory Visit: Payer: Self-pay | Admitting: Physician Assistant

## 2016-11-13 ENCOUNTER — Ambulatory Visit (INDEPENDENT_AMBULATORY_CARE_PROVIDER_SITE_OTHER): Payer: Medicare Other | Admitting: Nurse Practitioner

## 2016-11-13 ENCOUNTER — Telehealth: Payer: Self-pay | Admitting: *Deleted

## 2016-11-13 ENCOUNTER — Encounter: Payer: Self-pay | Admitting: Nurse Practitioner

## 2016-11-13 VITALS — BP 180/100 | HR 81 | Ht 65.0 in | Wt 142.4 lb

## 2016-11-13 DIAGNOSIS — I2510 Atherosclerotic heart disease of native coronary artery without angina pectoris: Secondary | ICD-10-CM

## 2016-11-13 DIAGNOSIS — I10 Essential (primary) hypertension: Secondary | ICD-10-CM

## 2016-11-13 DIAGNOSIS — I2583 Coronary atherosclerosis due to lipid rich plaque: Secondary | ICD-10-CM

## 2016-11-13 DIAGNOSIS — I38 Endocarditis, valve unspecified: Secondary | ICD-10-CM | POA: Diagnosis not present

## 2016-11-13 NOTE — Patient Instructions (Addendum)
We will be checking the following labs today - NONE   Medication Instructions:    Continue with your current medicines for now.     Testing/Procedures To Be Arranged:  N/A  Follow-Up:   See Dr. Marlou Porch as planned.     Other Special Instructions:   Monitor the blood pressure three times a day from now until Monday - call us with an update on Monday - we may need to add additional medicine - if so - I will then see you back in about a month - if not - we will see Dr. Marlou Porch back as planned.    We will request your last OV note from Dr. [name]     If you need a refill on your cardiac medications before your next appointment, please call your pharmacy.   Call the [place] office at [phone] if you have any questions, problems or concerns.

## 2016-11-13 NOTE — Progress Notes (Addendum)
CARDIOLOGY OFFICE NOTE  Date:  11/13/2016    Joan Hunter Date of Birth: [birth year] Medical Record #[id number]  PCP:  Kandice Hams, MD  Cardiologist:  [facility]    Chief Complaint  Patient presents with  . Coronary Artery Disease    4 month check - seen for Dr. Marlou Porch    History of Present Illness: Joan Hunter is a 82 y.o. female who presents today for an approximate 3 month check. Seen for Dr. Marlou Porch.   She has moderate coronary artery disease per prior catheterization in 2009, prior idiopathic cardiomyopathy (EF was 15%) now resolved, & mitral regurgitation.  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  Her other issues include COPD/bronchitis, pulmonary fibrosis, hiatal hernia, & HLD.   Last seen in August - felt to be stable.   Comes in today. Here with her daughter. BP high today. Apparently has been taken off Norvasc - by Dr. [name] - not clear why she was taken off - has been off maybe since October 10th. Does not really check her BP at home. She has had her other medicines today. No chest pain. Breathing is stable. She does tire easily but this is unchanged. She feels like she is doing ok. Says nothing hurts. Labs are checked by PCP. She does have a BP cuff at home. Not really using much salt.    Past Medical History:  Diagnosis Date  . Allergic rhinitis   . Arthritis   . CAD (coronary artery disease)    mod 2V CAD (60% LAD, 70-80% mid CX) by 01/2008 cath  . CHF (congestive heart failure) ([place])   . Diabetes mellitus    dx 10 plus yrs ago  . Dilated cardiomyopathy ([place])    now resolved, EF 60-65% echo 10/11/11 (Dr. Marlou Porch)  . Diverticulosis   . Hyperlipidemia   . Hypertension   . Lung mass   . MR (mitral regurgitation)   . Osteopenia     Past Surgical History:  Procedure Laterality Date  . ABDOMINAL HYSTERECTOMY    . APPENDECTOMY    . EYE  SURGERY     bil cataracts  . KNEE ARTHROSCOPY     ?  left  knee  . TONSILLECTOMY       Medications: Current Outpatient Prescriptions  Medication Sig Dispense Refill  . aspirin 81 MG tablet Take 81 mg by mouth daily.    Marland Kitchen atorvastatin (LIPITOR) 40 MG tablet Take 40 mg by mouth daily at 6 PM.     . bisoprolol (ZEBETA) 10 MG tablet Take 1 tablet (10 mg total) by mouth daily. 30 tablet 11  . furosemide (LASIX) 40 MG tablet Take 1 tablet (40 mg total) by mouth every other day. 15 tablet 6  . glipiZIDE (GLUCOTROL) 5 MG tablet Take 2.5 mg by mouth daily.    . pantoprazole (PROTONIX) 40 MG tablet Take 40 mg by mouth daily.      No current facility-administered medications for this visit.     Allergies: Allergies  Allergen Reactions  . Moxifloxacin     REACTION: hives  . Penicillins     REACTION: hives  . Prednisone     REACTION: hives    Social History: The patient  reports that she quit smoking about 44 years ago. Her smoking use included Cigarettes. She smoked 0.50 packs per day. She has never used smokeless tobacco. She reports that  she does not drink alcohol or use drugs.   Family History: The patient's family history includes Colon cancer in her mother; Heart disease in her brother.   Review of Systems: Please see the history of present illness.   Otherwise, the review of systems is positive for none.   All other systems are reviewed and negative.   Physical Exam: VS:  BP (!) 180/100   Pulse 81   Ht '5\' 5"'$  (1.651 m)   Wt 142 lb 6.4 oz (64.6 kg)   SpO2 97% Comment: at rest  BMI 23.70 kg/m  .  BMI Body mass index is 23.7 kg/m.  Wt Readings from Last 3 Encounters:  11/13/16 142 lb 6.4 oz (64.6 kg)  08/05/16 142 lb (64.4 kg)  04/01/16 144 lb (65.3 kg)    General: Pleasant. Elderly female who is alert and in no acute distress.  Using a cane.  HEENT: Normal.  Neck: Supple, no JVD, carotid bruits, or masses noted.  Cardiac: Regular rate and rhythm. Heart tones are  distant. No edema.  Respiratory:  Decreased breath sounds bilaterally with normal work of breathing.  GI: Soft and nontender.  MS: No deformity or atrophy. Gait and ROM intact.  Skin: Warm and dry. Color is normal.  Neuro:  Strength and sensation are intact and no gross focal deficits noted.  Psych: Alert, appropriate and with normal affect.   LABORATORY DATA:  EKG:  EKG is not ordered today.   Lab Results  Component Value Date   WBC 10.0 01/02/2016   HGB 12.5 01/02/2016   HCT 38.3 01/02/2016   PLT 243 01/02/2016   GLUCOSE 173 (H) 01/02/2016   CHOL 143 06/15/2012   TRIG 83 06/15/2012   HDL 62 06/15/2012   LDLCALC 64 06/15/2012   ALT <9 01/02/2016   AST 16 01/02/2016   NA 143 01/02/2016   K 3.1 (L) 01/02/2016   CL 98 (L) 05/01/2015   CREATININE 1.3 (H) 01/02/2016   BUN 12.4 01/02/2016   CO2 30 (H) 01/02/2016   TSH 0.724 02/09/2015   INR 1.14 02/09/2015   HGBA1C 6.6 (H) 02/09/2015    BNP (last 3 results) No results for input(s): BNP in the last 8760 hours.  ProBNP (last 3 results) No results for input(s): PROBNP in the last 8760 hours.   Other Studies Reviewed Today: ECHO 2012:   1. Normal LV size and function. 2. There were no regional wall motion abnormalities. 3. Left ventricular ejection fraction estimated by 2D at 60-65 percent. 4. Mild mitral valve regurgitation. 5. Trivial tricuspid regurgitation.  Assessment/Plan: 1. Left ventricular systolic dysfunction-previous EF 15%, now normal/now resolved with ejection fraction of 65% per last echo from 2012, doing well clinically. Very little medicine.  2. Non-small cell cancer of right lung, stage IA-treated with prior radiation - now followed by Dr. Earlie Server - sees him in January. 3. Pulmonary fibrosis-Dr. Melvyn Novas. Changed Coreg to bystolic to bisoprolol. Pepcid. because of cough. Improved. Last hospitalization-September 2016 with inflammatory state.  4. Mitral regurgitation-previously characterized as severe (in  the setting of transient cardiomyopathy likely secondary to malcoaptation) but now mild. Doing well. 5. Hypertension- apparently has had prior symptoms of orthostatic hypotension seemed to have resolved. She has had medicines cut back. Not clear if this is why she is not on Norvasc. Daughter agreeable to checking BP at home and calling us with an update - would at least like to get her below 160/90. Will request Dr. Lina Sar last not to figure out why  Norvasc was stopped. If BP remains up could restart Norvasc at half dose (unless had some side effect) or try low dose Hydralazine.  6. Edema - QOD lasix, well controlled.  7. Left anterior fascicular block-chronic, unchanged, no syncope  Current medicines are reviewed with the patient today.  The patient does not have concerns regarding medicines other than what has been noted above.  The following changes have been made:  See above.  Labs/ tests ordered today include:   No orders of the defined types were placed in this encounter.    Disposition:   FU with Dr. Marlou Porch as planned.  If I need to add additional BP medicine - I will see her back in a month. Labs with PCP.   Patient is agreeable to this plan and will call if any problems develop in the interim.   Signed: Burtis Junes, RN, ANP-C 11/13/2016 10:05 AM  [facility] [address] [place], [place]  [address] Phone: [phone] Fax: [phone]       Addendum: Received OV note from Dr. [name] - noted that Norvasc is not on her med list. No mention of stopping Norvasc.   I think we could consider restarting as needed.  Burtis Junes, RN, [facility] [address] [place], [place]  [address] [phone]

## 2016-11-13 NOTE — Telephone Encounter (Signed)
S/w Pam at Dr. Lina Sar office will fax over last pt's ov note.

## 2016-11-18 ENCOUNTER — Telehealth: Payer: Self-pay | Admitting: Cardiology

## 2016-11-18 NOTE — Telephone Encounter (Signed)
These look pretty good - would stay on current course of treatment. Continue to monitor.

## 2016-11-18 NOTE — Telephone Encounter (Signed)
Pt daughter, Larena Glassman, calling to give BP readings for her mother, taken over the weekend. Last seen by Truitt Merle, NP on 11/13/16.   11/30: BP- 128/78  HR- 75   BP: 128/91   HR: 75  12/1:  BP- 145/77  HR- 78   BP- 143/78  HR-68  12/2:  BP-118/76  HR-76   12/3:  BP- 140/94  HR- 85   BP- 143/82  HR- 83  12/4:  BP- 122/79  HR- 78  Please call to advise.

## 2016-11-18 NOTE — Telephone Encounter (Signed)
New message  Pt is returning L[place]'s call  Please call back

## 2016-11-18 NOTE — Telephone Encounter (Signed)
Pt's daughter, [name] notified.

## 2016-11-18 NOTE — Telephone Encounter (Signed)
LMTCB for daughter, Larena Glassman [phone]

## 2016-12-06 ENCOUNTER — Ambulatory Visit (INDEPENDENT_AMBULATORY_CARE_PROVIDER_SITE_OTHER): Payer: Medicare Other | Admitting: Neurology

## 2016-12-06 ENCOUNTER — Encounter: Payer: Self-pay | Admitting: Neurology

## 2016-12-06 VITALS — BP 150/86 | HR 85 | Ht 65.0 in | Wt 137.2 lb

## 2016-12-06 DIAGNOSIS — F03B18 Unspecified dementia, moderate, with other behavioral disturbance: Secondary | ICD-10-CM

## 2016-12-06 DIAGNOSIS — Z85118 Personal history of other malignant neoplasm of bronchus and lung: Secondary | ICD-10-CM

## 2016-12-06 DIAGNOSIS — F0391 Unspecified dementia with behavioral disturbance: Secondary | ICD-10-CM | POA: Diagnosis not present

## 2016-12-06 NOTE — Progress Notes (Signed)
NEUROLOGY CONSULTATION NOTE  Joan Hunter MRN: [id number] DOB: 20-Oct-1927  Referring provider: Dr. [name]  Primary care provider: Dr. [name]  Reason for consult:  Memory loss, hallucinations  Dear Dr [name]:  Thank you for your kind referral of Joan Hunter for consultation of the above symptoms. Although her history is well known to you, please allow me to reiterate it for the purpose of our medical record. The patient was accompanied to the clinic by her daughter who also provides collateral information. Records and images were personally reviewed where available.  HISTORY OF PRESENT ILLNESS: This is an 82 year old right-handed womant-handed woman with a history of diabetes, hypertension, CAD, lung cancer, presenting for evaluation of worsening memory and hallucinations. When asked about her memory, she states there are some things she remembers and some she does not. She lives with her daughter and states that she cooks (she does not). She denies misplacing things but daughter shakes her head. Her daughter administers medications, otherwise she would not take them. Her daughter states that memory changes started only around 2 months ago, prior to this it was "pretty good." She started having difficulties remembering the days of the week, asking her grandson why he was not in school, then a few minutes later asking him the same question. She used to fix her own medications, but over the past month, her daughter has to physically watch her take them. She used to live by herself but after 2 falls, her daughter has lived with her for the past 2 years. Daughter has always been in charge of bill payments. She stopped driving 5 years ago due to bad knees. She denied getting lost driving, her daughter states she can still give directions when someone else is driving.  Her daughter's main concern are the hallucinations. The patient thinks these are real and denies having any  hallucinations. Her daughter cites multiple instances, these started in June but have been worsening. She would talk about people across the street or someone sitting outside, a dog across the street in the car. She told her daughter she is in trouble with the people she works with, that they were downstairs. She has seen a cat on the bed, or something under the bed, something eating her shoes. She would ask her daughter if the man downstairs was gone. She said there was a green car in the driveway, which was actually a large tarp. She saw a little boy sitting up on the tree outside. She called her daughter "Momma" this morning. She denies any auditory hallucinations. Her daughter denies any personality changes, no paranoia associated with these hallucinations. The patient denies any headaches, dizziness, diplopia, dysarthria, dysphagia, neck/back pain, focal numbness/tingling/weakness, bowel/bladder dysfunction. No anosmia, tremors, no falls. They deny any significant head injuries. No family history of dementia.   PAST MEDICAL HISTORY: Past Medical History:  Diagnosis Date  . Allergic rhinitis   . Arthritis   . CAD (coronary artery disease)    mod 2V CAD (60% LAD, 70-80% mid CX) by 01/2008 cath  . CHF (congestive heart failure) ([facility])   . Diabetes mellitus    dx 10 plus yrs ago  . Dilated cardiomyopathy ([facility])    now resolved, EF 60-65% echo 10/11/11 (Dr. Marlou Porch)  . Diverticulosis   . Hyperlipidemia   . Hypertension   . Lung mass   . MR (mitral regurgitation)   . Osteopenia     PAST SURGICAL HISTORY: Past Surgical History:  Procedure  Laterality Date  . ABDOMINAL HYSTERECTOMY    . APPENDECTOMY    . EYE SURGERY     bil cataracts  . KNEE ARTHROSCOPY     ?  left  knee  . TONSILLECTOMY      MEDICATIONS: Current Outpatient Prescriptions on File Prior to Visit  Medication Sig Dispense Refill  . aspirin 81 MG tablet Take 81 mg by mouth daily.    Marland Kitchen atorvastatin (LIPITOR) 40 MG tablet  Take 40 mg by mouth daily at 6 PM.     . bisoprolol (ZEBETA) 10 MG tablet Take 1 tablet (10 mg total) by mouth daily. 30 tablet 11  . furosemide (LASIX) 40 MG tablet Take 1 tablet (40 mg total) by mouth every other day. 15 tablet 6  . glipiZIDE (GLUCOTROL) 5 MG tablet Take 2.5 mg by mouth daily.    . pantoprazole (PROTONIX) 40 MG tablet Take 40 mg by mouth daily.      No current facility-administered medications on file prior to visit.     ALLERGIES: Allergies  Allergen Reactions  . Moxifloxacin     REACTION: hives  . Penicillins     REACTION: hives  . Prednisone     REACTION: hives    FAMILY HISTORY: Family History  Problem Relation Age of Onset  . Colon cancer Mother   . Heart disease Brother     SOCIAL HISTORY: Social History   Social History  . Marital status: Widowed    Spouse name: N/A  . Number of children: N/A  . Years of education: N/A   Occupational History  . Not on file.   Social History Main Topics  . Smoking status: Former Smoker    Packs/day: 0.50    Types: Cigarettes    Quit date: 06/15/1972  . Smokeless tobacco: Never Used  . Alcohol use No  . Drug use: No  . Sexual activity: Not on file   Other Topics Concern  . Not on file   Social History Narrative  . No narrative on file    REVIEW OF SYSTEMS: Constitutional: No fevers, chills, or sweats, no generalized fatigue, change in appetite Eyes: No visual changes, double vision, eye pain Ear, nose and throat: No hearing loss, ear pain, nasal congestion, sore throat Cardiovascular: No chest pain, palpitations Respiratory:  No shortness of breath at rest or with exertion, wheezes GastrointestinaI: No nausea, vomiting, diarrhea, abdominal pain, fecal incontinence Genitourinary:  No dysuria, urinary retention or frequency Musculoskeletal:  No neck pain, back pain Integumentary: No rash, pruritus, skin lesions Neurological: as above Psychiatric: No depression, insomnia, anxiety Endocrine: No  palpitations, fatigue, diaphoresis, mood swings, change in appetite, change in weight, increased thirst Hematologic/Lymphatic:  No anemia, purpura, petechiae. Allergic/Immunologic: no itchy/runny eyes, nasal congestion, recent allergic reactions, rashes  PHYSICAL EXAM: Vitals:   12/06/16 1016  BP: (!) 150/86  Pulse: 85   General: No acute distress Head:  Normocephalic/atraumatic Eyes: Fundoscopic exam shows bilateral sharp discs, no vessel changes, exudates, or hemorrhages Neck: supple, no paraspinal tenderness, full range of motion Back: No paraspinal tenderness Heart: regular rate and rhythm Lungs: Clear to auscultation bilaterally. Vascular: No carotid bruits. Skin/Extremities: No rash, no edema Neurological Exam: Mental status: alert and oriented to person, place, and month/season, no dysarthria or aphasia, Fund of knowledge is reduced.  Recent and remote memory are impaired.  Attention and concentration are normal. Able to spell WORLD but only gave one letter correct spelling it backwards.  Able to name objects and repeat phrases.  4/5  MMSE - Mini Mental State Exam 12/06/2016  Orientation to time 2  Orientation to Place 4  Registration 3  Attention/ Calculation 1  Recall 0  Language- name 2 objects 2  Language- repeat 1  Language- follow 3 step command 2  Language- read & follow direction 1  Write a sentence 1  Copy design 0  Total score 17   Cranial nerves: CN I: not tested CN II: pupils equal, round and reactive to light, visual fields intact, fundi unremarkable. CN III, IV, VI:  full range of motion, no nystagmus, no ptosis CN V: facial sensation intact CN VII: upper and lower face symmetric CN VIII: hearing intact to finger rub CN IX, X: gag intact, uvula midline CN XI: sternocleidomastoid and trapezius muscles intact CN XII: tongue midline Bulk & Tone: normal, no cogwheeling, no fasciculations. Motor: 5/5 throughout with no pronator drift. Sensation: intact to  light touch, cold, pin, vibration and joint position sense.  No extinction to double simultaneous stimulation.  Romberg test negative Deep Tendon Reflexes: +1 throughout, no ankle clonus Plantar responses: downgoing bilaterally Cerebellar: no incoordination on finger to nose, heel to shin. No dysdiadochokinesia Gait: narrow-based and steady, able to tandem walk adequately. Tremor: none  IMPRESSION: This is an 82 year old right-handed woman with a history of diabetes, hypertension, CAD, lung cancer, presenting for evaluation of worsening memory and hallucinations. She has been living with her daughter for the past 2 years, who reports hallucinations started 6 months ago, but memory changes started only 2 months ago. Her MMSE today is 17/30, indicating moderate dementia. Lewy body dementia with the prominent hallucinations is a consideration. With fairly rapid progression of symptoms,  MRI brain with and without contrast will be ordered to assess for underlying structural abnormality, particularly with history of lung cancer. She is already taking Aricept, continue current dose. We discussed starting Depakote for the behavioral changes associated with dementia, her daughter would like to wait for MRI results first. Continue to monitor home safety, we discussed that she may need higher level of care at a later point. She does not drive. She will follow-up in 6 months and knows to call for any changes.   Thank you for allowing me to participate in the care of this patient. Please do not hesitate to call for any questions or concerns.   Ellouise Newer, M.D.  CC: Dr. [name]

## 2016-12-06 NOTE — Patient Instructions (Addendum)
1. Schedule MRI brain with and without contrast 2. Continue Aricept 3. After MRI brain, we will plan to start Depakote which may help with hallucinations 4. Continue to monitor home safety, she may need more help at home at some point 5. Follow-up in 6 months, call for any changes

## 2016-12-10 ENCOUNTER — Telehealth: Payer: Self-pay | Admitting: Neurology

## 2016-12-10 ENCOUNTER — Telehealth: Payer: Self-pay

## 2016-12-10 NOTE — Telephone Encounter (Signed)
Patient daughter [name] called and states patient needs a open MRI done at [facility] patient daughter phone number is [phone]

## 2016-12-10 NOTE — Telephone Encounter (Signed)
Notified patient's daughter MRI order was sent to [facility]. Advised her to give them a couple days and if she does not hear from them I will follow-up.

## 2016-12-10 NOTE — Telephone Encounter (Signed)
Entered in error

## 2016-12-13 ENCOUNTER — Encounter: Payer: Self-pay | Admitting: Neurology

## 2016-12-13 DIAGNOSIS — Z85118 Personal history of other malignant neoplasm of bronchus and lung: Secondary | ICD-10-CM | POA: Insufficient documentation

## 2016-12-13 DIAGNOSIS — F03B18 Unspecified dementia, moderate, with other behavioral disturbance: Secondary | ICD-10-CM | POA: Insufficient documentation

## 2016-12-13 DIAGNOSIS — F0391 Unspecified dementia with behavioral disturbance: Secondary | ICD-10-CM | POA: Insufficient documentation

## 2016-12-19 ENCOUNTER — Encounter: Payer: Self-pay | Admitting: Neurology

## 2016-12-26 ENCOUNTER — Other Ambulatory Visit (HOSPITAL_BASED_OUTPATIENT_CLINIC_OR_DEPARTMENT_OTHER): Payer: Medicare Other

## 2016-12-26 ENCOUNTER — Ambulatory Visit (HOSPITAL_COMMUNITY)
Admission: RE | Admit: 2016-12-26 | Discharge: 2016-12-26 | Disposition: A | Discharge status: Home / Self Care | Payer: Medicare Other | Source: Ambulatory Visit | Attending: Internal Medicine | Admitting: Internal Medicine

## 2016-12-26 ENCOUNTER — Other Ambulatory Visit: Payer: Self-pay | Admitting: *Deleted

## 2016-12-26 ENCOUNTER — Encounter (HOSPITAL_COMMUNITY): Payer: Self-pay

## 2016-12-26 DIAGNOSIS — K449 Diaphragmatic hernia without obstruction or gangrene: Secondary | ICD-10-CM | POA: Insufficient documentation

## 2016-12-26 DIAGNOSIS — C3491 Malignant neoplasm of unspecified part of right bronchus or lung: Secondary | ICD-10-CM | POA: Insufficient documentation

## 2016-12-26 DIAGNOSIS — C3431 Malignant neoplasm of lower lobe, right bronchus or lung: Secondary | ICD-10-CM | POA: Diagnosis not present

## 2016-12-26 DIAGNOSIS — I700 Atherosclerosis of aorta: Secondary | ICD-10-CM | POA: Insufficient documentation

## 2016-12-26 DIAGNOSIS — R918 Other nonspecific abnormal finding of lung field: Secondary | ICD-10-CM | POA: Insufficient documentation

## 2016-12-26 DIAGNOSIS — I2510 Atherosclerotic heart disease of native coronary artery without angina pectoris: Secondary | ICD-10-CM | POA: Diagnosis not present

## 2016-12-26 LAB — COMPREHENSIVE METABOLIC PANEL
ALT: 8 U/L (ref 0–55)
AST: 16 U/L (ref 5–34)
Albumin: 3.4 g/dL — ABNORMAL LOW (ref 3.5–5.0)
Alkaline Phosphatase: 105 U/L (ref 40–150)
Anion Gap: 10 mEq/L (ref 3–11)
BILIRUBIN TOTAL: 0.84 mg/dL (ref 0.20–1.20)
BUN: 16.7 mg/dL (ref 7.0–26.0)
CO2: 29 meq/L (ref 22–29)
Calcium: 9.3 mg/dL (ref 8.4–10.4)
Chloride: 105 mEq/L (ref 98–109)
Creatinine: 1 mg/dL (ref 0.6–1.1)
EGFR: 57 mL/min/{1.73_m2} — AB (ref >90)
GLUCOSE: 138 mg/dL (ref 70–140)
Potassium: 2.9 mEq/L — CL (ref 3.5–5.1)
SODIUM: 144 meq/L (ref 136–145)
TOTAL PROTEIN: 7.3 g/dL (ref 6.4–8.3)

## 2016-12-26 LAB — CBC WITH DIFFERENTIAL/PLATELET
BASO%: 0.2 % (ref 0.0–2.0)
Basophils Absolute: 0 10*3/uL (ref 0.0–0.1)
EOS%: 1.3 % (ref 0.0–7.0)
Eosinophils Absolute: 0.1 10*3/uL (ref 0.0–0.5)
HCT: 38.5 % (ref 34.8–46.6)
HGB: 12.5 g/dL (ref 11.6–15.9)
LYMPH%: 22 % (ref 14.0–49.7)
MCH: 28.3 pg (ref 25.1–34.0)
MCHC: 32.5 g/dL (ref 31.5–36.0)
MCV: 87.3 fL (ref 79.5–101.0)
MONO#: 0.9 10*3/uL (ref 0.1–0.9)
MONO%: 9.5 % (ref 0.0–14.0)
NEUT%: 67 % (ref 38.4–76.8)
NEUTROS ABS: 6.1 10*3/uL (ref 1.5–6.5)
Platelets: 194 10*3/uL (ref 145–400)
RBC: 4.41 10*6/uL (ref 3.70–5.45)
RDW: 14.9 % — AB (ref 11.2–14.5)
WBC: 9.1 10*3/uL (ref 3.9–10.3)
lymph#: 2 10*3/uL (ref 0.9–3.3)

## 2016-12-26 MED ORDER — POTASSIUM CHLORIDE CRYS ER 20 MEQ PO TBCR: 40.0000 meq | EXTENDED_RELEASE_TABLET | Freq: Every day | ORAL | 0 refills | Status: DC

## 2016-12-26 NOTE — Telephone Encounter (Signed)
Notified pt of rx and instructions

## 2016-12-31 ENCOUNTER — Telehealth: Payer: Self-pay

## 2016-12-31 MED ORDER — DIVALPROEX SODIUM ER 250 MG PO TB24: ORAL_TABLET | ORAL | 6 refills | Status: DC

## 2016-12-31 NOTE — Telephone Encounter (Signed)
Patients daughter [name] called asking for Dr. Delice Lesch to call her back to discuss her starting Depakote. She has some questions about side effects and pts heart. QB#[phone]. Please advise.

## 2016-12-31 NOTE — Telephone Encounter (Signed)
Spoke with daughter. Her MRI brain and it shows age-related changes, no evidence of tumor, stroke, or bleed. Discussed on previous visit potentially starting Depakote for behavioral changes. Side effects were discussed, she is agreeable to starting low dose Depakote ER '250mg'$  qhs. Rx sent to pharmacy.

## 2017-01-01 ENCOUNTER — Telehealth: Payer: Self-pay | Admitting: Internal Medicine

## 2017-01-01 NOTE — Telephone Encounter (Signed)
Pt called to r/s 1/18 appt due to weather. gavept new appt date/time

## 2017-01-02 ENCOUNTER — Ambulatory Visit: Payer: Self-pay | Admitting: Internal Medicine

## 2017-01-07 ENCOUNTER — Other Ambulatory Visit: Payer: Self-pay | Admitting: Cardiology

## 2017-01-09 ENCOUNTER — Ambulatory Visit (HOSPITAL_BASED_OUTPATIENT_CLINIC_OR_DEPARTMENT_OTHER): Payer: Medicare Other | Admitting: Internal Medicine

## 2017-01-09 ENCOUNTER — Telehealth: Payer: Self-pay | Admitting: Internal Medicine

## 2017-01-09 ENCOUNTER — Encounter: Payer: Self-pay | Admitting: Internal Medicine

## 2017-01-09 VITALS — BP 147/82 | HR 90 | Temp 98.2°F | Resp 14 | Ht 65.0 in | Wt 132.0 lb

## 2017-01-09 DIAGNOSIS — E876 Hypokalemia: Secondary | ICD-10-CM | POA: Diagnosis not present

## 2017-01-09 DIAGNOSIS — C3491 Malignant neoplasm of unspecified part of right bronchus or lung: Secondary | ICD-10-CM

## 2017-01-09 DIAGNOSIS — C3431 Malignant neoplasm of lower lobe, right bronchus or lung: Secondary | ICD-10-CM | POA: Diagnosis not present

## 2017-01-09 HISTORY — DX: Hypokalemia: E87.6

## 2017-01-09 MED ORDER — POTASSIUM CHLORIDE CRYS ER 20 MEQ PO TBCR: 40.0000 meq | EXTENDED_RELEASE_TABLET | Freq: Every day | ORAL | 0 refills | Status: DC

## 2017-01-09 NOTE — Progress Notes (Signed)
[facility] Telephone:([phone]   Fax:([phone]  OFFICE PROGRESS NOTE  Kandice Hams, MD [address] [place] [address]  DIAGNOSIS: Stage IA (T1b, N0, M0) non-small cell lung cancer consistent with adenocarcinoma involving the right lower lobe, diagnosed in September 2014.  PRIOR THERAPY: Status post curative stereotactic radiotherapy to the right lower lobe lung nodule under the care of Dr. Tammi Klippel completed on 10/29/2013.  CURRENT THERAPY: Observation.  INTERVAL HISTORY: Joan Hunter 83 y.o. female came to the clinic today for follow-up visit accompanied by her daughter. The patient is doing fine today with no specific complaints except for the baseline weakness and fatigue. She has no concerning respiratory symptoms, no chest pain, shortness breath, cough or hemoptysis. She has no fever or chills. She has hypokalemia and she quit taking potassium chloride as prescribed by her primary care physician. She is currently on Lasix. She had repeat CT scan of the chest performed recently and she is here for evaluation and discussion of her scan results.  MEDICAL HISTORY: Past Medical History:  Diagnosis Date  . Allergic rhinitis   . Arthritis   . CAD (coronary artery disease)    mod 2V CAD (60% LAD, 70-80% mid CX) by 01/2008 cath  . CHF (congestive heart failure) ([facility])   . Diabetes mellitus    dx 10 plus yrs ago  . Dilated cardiomyopathy ([facility])    now resolved, EF 60-65% echo 10/11/11 (Dr. Marlou Porch)  . Diverticulosis   . Hyperlipidemia   . Hypertension   . Lung mass   . MR (mitral regurgitation)   . Osteopenia     ALLERGIES:  is allergic to moxifloxacin; penicillins; and prednisone.  MEDICATIONS:  Current Outpatient Prescriptions  Medication Sig Dispense Refill  . aspirin 81 MG tablet Take 81 mg by mouth daily.    Marland Kitchen atorvastatin (LIPITOR) 40 MG tablet Take 40 mg by mouth daily at 6 PM.     . bisoprolol (ZEBETA) 10 MG tablet  Take 1 tablet (10 mg total) by mouth daily. 30 tablet 11  . divalproex (DEPAKOTE ER) 250 MG 24 hr tablet Take 1 tablet at night 30 tablet 6  . donepezil (ARICEPT) 5 MG tablet Take 5 mg by mouth at bedtime.    . famotidine (PEPCID) 20 MG tablet Take 20 mg by mouth 2 (two) times daily.    . furosemide (LASIX) 40 MG tablet Take 1 tablet (40 mg total) by mouth every other day. 15 tablet 9  . glipiZIDE (GLUCOTROL) 5 MG tablet Take 2.5 mg by mouth daily.    . pantoprazole (PROTONIX) 40 MG tablet Take 40 mg by mouth daily.     . potassium chloride SA (K-DUR,KLOR-CON) 20 MEQ tablet Take 2 tablets (40 mEq total) by mouth daily. 20 tablet 0   No current facility-administered medications for this visit.     SURGICAL HISTORY:  Past Surgical History:  Procedure Laterality Date  . ABDOMINAL HYSTERECTOMY    . APPENDECTOMY    . EYE SURGERY     bil cataracts  . KNEE ARTHROSCOPY     ?  left  knee  . TONSILLECTOMY      REVIEW OF SYSTEMS:  A comprehensive review of systems was negative except for: Constitutional: positive for fatigue Musculoskeletal: positive for back pain   PHYSICAL EXAMINATION: General appearance: alert, cooperative, fatigued and no distress Head: Normocephalic, without obvious abnormality, atraumatic Neck: no adenopathy, no JVD, supple, symmetrical, trachea midline and  thyroid not enlarged, symmetric, no tenderness/mass/nodules Lymph nodes: Cervical, supraclavicular, and axillary nodes normal. Resp: clear to auscultation bilaterally Back: symmetric, no curvature. ROM normal. No CVA tenderness. Cardio: regular rate and rhythm, S1, S2 normal, no murmur, click, rub or gallop GI: soft, non-tender; bowel sounds normal; no masses,  no organomegaly Extremities: extremities normal, atraumatic, no cyanosis or edema  ECOG PERFORMANCE STATUS: 1 - Symptomatic but completely ambulatory  Blood pressure (!) 147/82, pulse 90, temperature 98.2 F (36.8 C), temperature source Oral, resp. rate  14, height '5\' 5"'$  (1.651 m), weight 132 lb (59.9 kg), SpO2 97 %.  LABORATORY DATA: Lab Results  Component Value Date   WBC 9.1 12/26/2016   HGB 12.5 12/26/2016   HCT 38.5 12/26/2016   MCV 87.3 12/26/2016   PLT 194 12/26/2016      Chemistry      Component Value Date/Time   NA 144 12/26/2016 1022   K 2.9 (LL) 12/26/2016 1022   CL 98 (L) 05/01/2015 1900   CO2 29 12/26/2016 1022   BUN 16.7 12/26/2016 1022   CREATININE 1.0 12/26/2016 1022      Component Value Date/Time   CALCIUM 9.3 12/26/2016 1022   ALKPHOS 105 12/26/2016 1022   AST 16 12/26/2016 1022   ALT 8 12/26/2016 1022   BILITOT 0.84 12/26/2016 1022       RADIOGRAPHIC STUDIES: Ct Chest Wo Contrast  Result Date: 12/26/2016 CLINICAL DATA:  Followup lung cancer. EXAM: CT CHEST WITHOUT CONTRAST TECHNIQUE: Multidetector CT imaging of the chest was performed following the standard protocol without IV contrast. COMPARISON:  01/02/2016 FINDINGS: Cardiovascular: Heart size appears normal. There is no pericardial effusion. Aortic atherosclerosis noted. Calcifications within the RCA, LAD and left circumflex coronary artery noted. Mediastinum/Nodes: The trachea appears patent and is midline. Small to moderate hiatal hernia noted no mediastinal or hilar adenopathy identified. Lungs/Pleura: No pleural fluid identified. Right midlung fibrotic changes are identified and appear unchanged from previous exam. Small pulmonary nodules appear similar to previous exam. Index nodule within the left lower lobe is stable measuring 3 mm, image 58 of series 5. The a adjacent ovoid nodular density measures 11 mm and is also stable (when allowing for differences in technique). The pulmonary nodule within the posterior right lung base measures 1.1 by 1.1 cm, image 115 of series 5. Previously this measured the same. Index nodule in the right middle lobe measures 4 mm, image 92 of series 5. Unchanged from previous study. Three nodules within the right middle lobe  were present on study from 01/27/2015 and are also likely benign. No new or progressive pulmonary nodularity identified. Upper Abdomen: The adrenal glands appear normal. There is no acute abnormality identified within the abdomen. Musculoskeletal: There is degenerative disc disease noted within the thoracic spine. No aggressive lytic or sclerotic bone lesions. T5 and T6 fracture deformities are stable compared with the previous exam. IMPRESSION: 1. No acute cardiopulmonary abnormalities. 2. Similar appearance of bilateral pulmonary nodularity including the dominant nodule within the right lung base measuring 11 mm. 3. Bandlike area of fibrotic changes within the right midlung is unchanged from previous study. 4. Similar appearance of T5 and T6 fractures. 5. Hiatal hernia 6. Aortic atherosclerosis and coronary artery calcification. Electronically Signed   By: Kerby Moors M.D.   On: 12/26/2016 14:00   ASSESSMENT AND PLAN:  This is a very pleasant 83 years old African-American female with stage IA non-small cell lung cancer. She status post curative SBRT to the right lower lobe nodule  completed in November 2014. The patient is currently on observation. Her recent CT scan of the chest showed no clear evidence for disease recurrence. I discussed the scan results with the patient and her daughter. I recommended for her to continue on observation with repeat CT scan of the chest in one year. For the hypokalemia, I will send a prescription for potassium chloride 40 mEq by mouth daily for the next 10 days. I also advised the patient to contact her primary care physician for close monitoring of her potassium level especially with her treatment with Lasix. She was advised to call immediately if she has any concerning symptoms in the interval. The patient voices understanding of current disease status and treatment options and is in agreement with the current care plan.  All questions were answered. The patient  knows to call the clinic with any problems, questions or concerns. We can certainly see the patient much sooner if necessary. I spent 10 minutes counseling the patient face to face. The total time spent in the appointment was 15 minutes. Disclaimer: This note was dictated with voice recognition software. Similar sounding words can inadvertently be transcribed and may not be corrected upon review.

## 2017-01-09 NOTE — Telephone Encounter (Signed)
Appointment scheduled per 01/09/17 los. Patient was given a copy of the AVS report and appointment schedule per 01/09/17 los. °

## 2017-02-04 ENCOUNTER — Other Ambulatory Visit: Payer: Self-pay | Admitting: Internal Medicine

## 2017-02-10 ENCOUNTER — Ambulatory Visit: Payer: Self-pay | Admitting: Neurology

## 2017-02-17 ENCOUNTER — Other Ambulatory Visit: Payer: Self-pay | Admitting: Internal Medicine

## 2017-04-01 ENCOUNTER — Ambulatory Visit: Payer: Self-pay | Admitting: Cardiology

## 2017-04-15 ENCOUNTER — Ambulatory Visit: Payer: Self-pay | Admitting: Cardiology

## 2017-04-16 ENCOUNTER — Encounter: Payer: Self-pay | Admitting: Cardiology

## 2017-04-16 ENCOUNTER — Ambulatory Visit (INDEPENDENT_AMBULATORY_CARE_PROVIDER_SITE_OTHER): Payer: Medicare Other | Admitting: Cardiology

## 2017-04-16 VITALS — BP 114/70 | HR 104 | Ht 65.0 in | Wt 136.0 lb

## 2017-04-16 DIAGNOSIS — I2583 Coronary atherosclerosis due to lipid rich plaque: Secondary | ICD-10-CM

## 2017-04-16 DIAGNOSIS — J8410 Pulmonary fibrosis, unspecified: Secondary | ICD-10-CM

## 2017-04-16 DIAGNOSIS — R0602 Shortness of breath: Secondary | ICD-10-CM | POA: Diagnosis not present

## 2017-04-16 DIAGNOSIS — I700 Atherosclerosis of aorta: Secondary | ICD-10-CM

## 2017-04-16 DIAGNOSIS — I2510 Atherosclerotic heart disease of native coronary artery without angina pectoris: Secondary | ICD-10-CM | POA: Diagnosis not present

## 2017-04-16 NOTE — Progress Notes (Signed)
[facility]. [address], [place]  [address] Phone: [phone] Fax:  [phone]  Date:  04/16/2017   ID:  Joan Hunter, DOB August 06, 1927, MRN [id number]  PCP:  Kandice Hams, MD   History of Present Illness: Joan Hunter is a 83 y.o. female with moderate coronary artery disease prior catheterization in 2009, prior idiopathic cardiomyopathy (EF was 15%) now resolved 55%, mitral regurgitation mild here for followup. 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 Dr. Tammi Klippel. Radiation only.   Dr. Melvyn Novas changed Coreg to Bystolic to bisoprolol. COPD/bronchitis. Treating hiatal hernia with Protonix as well.   Continuing with atorvastatin, hyperlipidemia.  She experienced some dizziness when standing and her medications were previously reduced. This has improved.  Her daughter, [name], helps her out significantly but she also make sure that she gets up and moves around adequately.   I encouraged her to use her walker. She has a split-level house. Concerned about stair use.   04/16/17-she has been having some more deconditioning, or shortness of breath with activity. Her daughter still has to motivate her to get up she states. She denies any chest pain. No orthopnea. No edema. No increase in weight.    Wt Readings from Last 3 Encounters:  04/16/17 136 lb (61.7 kg)  01/09/17 132 lb (59.9 kg)  12/06/16 137 lb 3 oz (62.2 kg)     Past Medical History:  Diagnosis Date  . Allergic rhinitis   . Arthritis   . CAD (coronary artery disease)    mod 2V CAD (60% LAD, 70-80% mid CX) by 01/2008 cath  . CHF (congestive heart failure) ([place])   . Diabetes mellitus    dx 10 plus yrs ago  . Dilated cardiomyopathy ([place])    now resolved, EF 60-65% echo 10/11/11 (Dr. Marlou Porch)  . Diverticulosis   . Hyperlipidemia   . Hypertension   . Hypokalemia 01/09/2017  . Lung mass   . MR (mitral  regurgitation)   . Osteopenia     Past Surgical History:  Procedure Laterality Date  . ABDOMINAL HYSTERECTOMY    . APPENDECTOMY    . EYE SURGERY     bil cataracts  . KNEE ARTHROSCOPY     ?  left  knee  . TONSILLECTOMY      Current Outpatient Prescriptions  Medication Sig Dispense Refill  . amLODipine (NORVASC) 5 MG tablet Take 5 mg by mouth at bedtime.    Marland Kitchen aspirin 81 MG tablet Take 81 mg by mouth daily.    Marland Kitchen atorvastatin (LIPITOR) 40 MG tablet Take 40 mg by mouth daily at 6 PM.     . bisoprolol (ZEBETA) 10 MG tablet Take 1 tablet (10 mg total) by mouth daily. 30 tablet 11  . divalproex (DEPAKOTE ER) 250 MG 24 hr tablet Take 250 mg by mouth at bedtime.    . donepezil (ARICEPT) 5 MG tablet Take 5 mg by mouth at bedtime.    . furosemide (LASIX) 40 MG tablet Take 1 tablet (40 mg total) by mouth every other day. 15 tablet 9  . glipiZIDE (GLUCOTROL) 5 MG tablet Take 2.5 mg by mouth daily.    . pantoprazole (PROTONIX) 40 MG tablet Take 40 mg by mouth daily.     . potassium chloride SA (K-DUR,KLOR-CON) 20 MEQ tablet Take 2 tablets (40 mEq total) by mouth daily. 20 tablet 0  . PROAIR HFA 108 (90  Base) MCG/ACT inhaler Inhale 2 puffs into the lungs every 6 (six) hours as needed for wheezing or shortness of breath.  6  . traMADol (ULTRAM) 50 MG tablet Take 50 mg by mouth 2 (two) times daily as needed for pain. for pain  0   No current facility-administered medications for this visit.     Allergies:    Allergies  Allergen Reactions  . Moxifloxacin     REACTION: hives  . Penicillins     REACTION: hives  . Prednisone     REACTION: hives    Social History:  The patient  reports that she quit smoking about 44 years ago. Her smoking use included Cigarettes. She smoked 0.50 packs per day. She has never used smokeless tobacco. She reports that she does not drink alcohol or use drugs.   ROS:  Unless specified above, all other ROS negative.   PHYSICAL EXAM: VS:  BP 114/70   Pulse (!) 104    Ht '5\' 5"'$  (1.651 m)   Wt 136 lb (61.7 kg)   BMI 22.63 kg/m  GEN: Thin, well developed, in no acute distress  HEENT: normal  Neck: no JVD, carotid bruits, or masses Cardiac: Mildly tachy reg ; no murmurs, rubs, or gallops,no edema  Respiratory:  clear to auscultation bilaterally, normal work of breathing GI: soft, nontender, nondistended, + BS MS: no deformity or atrophy  Skin: warm and dry, no rash Neuro:  Alert and Oriented x 3, Strength and sensation are intact Psych: euthymic mood, full affect   EKG:  EKG ordered today 04/16/17 shows sinus tachycardia rate 104 with left anterior fascicular block, nonspecific ST-T wave changes. Personally viewed 04/01/16-sinus rhythm, 88, left anterior fascicular block, borderline LVH, nonspecific ST-T wave changes personally viewed-no significant change from prior-12/02/13-will rhythm, sinus arrhythmia, left axis deviation/left anterior fascicular block, LVH, nonspecific ST changes, heart rate 73    ECHO 2012:   1. Normal LV size and function. 2. There were no regional wall motion abnormalities. 3. Left ventricular ejection fraction estimated by 2D at 60-65 percent. 4. Mild mitral valve regurgitation. 5. Trivial tricuspid regurgitation.  Labs: Albumin 3.4, hemoglobin 12.5, creatinine 1.0  CT scan on 12/26/16-no recurrence of lung cancer. Aortic atherosclerosis and coronary artery calcification noted. Pulmonary fibrotic changes noted.   ASSESSMENT AND PLAN:   History of dilated cardiomyopathy  - Ejection fraction previously 15%, now normal at 65%. Excellent.  Non-small cell cancer of right lung, stage IA  - Radiation only   - Dr. Lovenia Kim signs of disease recurrence. Reviewed his office note from 01/09/17.  Pulmonary fibrosis   -  Dr. Melvyn Novas  bisoprolol. Improved.   Mitral regurgitation   - After cardiomyopathy resolved, mitral regurgitation also now mild.   Left anterior fascicular block   - Conduction disorder. No changes    Essential hypertension  - When she saw Truitt Merle last on 11/13/16 her blood pressure was severely elevated. However shortly after that visit, her blood pressures returned to normal. Excellent. I reviewed readings.  Aortic atherosclerosis  - Continue with secondary prevention.  6 month follow up. (Daughter requests).   Signed, Candee Furbish, MD [facility]  04/16/2017 4:15 PM

## 2017-04-16 NOTE — Patient Instructions (Signed)
Medication Instructions:  Your physician recommends that you continue on your current medications as directed. Please refer to the Current Medication list given to you today.   Labwork: None  Testing/Procedures: Your physician has requested that you have an echocardiogram. Echocardiography is a painless test that uses sound waves to create images of your heart. It provides your doctor with information about the size and shape of your heart and how well your heart's chambers and valves are working. This procedure takes approximately one hour. There are no restrictions for this procedure.  Follow-Up: Your physician wants you to follow-up in: 6 months with Dr. Marlou Porch. You will receive a reminder letter in the mail two months in advance. If you don't receive a letter, please call our office to schedule the follow-up appointment.   Any Other Special Instructions Will Be Listed Below (If Applicable).     If you need a refill on your cardiac medications before your next appointment, please call your pharmacy.

## 2017-05-01 ENCOUNTER — Ambulatory Visit (HOSPITAL_COMMUNITY): Payer: Medicare Other | Attending: Cardiovascular Disease

## 2017-05-01 ENCOUNTER — Other Ambulatory Visit: Payer: Self-pay

## 2017-05-01 DIAGNOSIS — I371 Nonrheumatic pulmonary valve insufficiency: Secondary | ICD-10-CM | POA: Insufficient documentation

## 2017-05-01 DIAGNOSIS — I358 Other nonrheumatic aortic valve disorders: Secondary | ICD-10-CM | POA: Diagnosis not present

## 2017-05-01 DIAGNOSIS — I361 Nonrheumatic tricuspid (valve) insufficiency: Secondary | ICD-10-CM | POA: Diagnosis not present

## 2017-05-01 DIAGNOSIS — I348 Other nonrheumatic mitral valve disorders: Secondary | ICD-10-CM | POA: Diagnosis not present

## 2017-05-01 DIAGNOSIS — R0602 Shortness of breath: Secondary | ICD-10-CM

## 2017-05-01 DIAGNOSIS — I5030 Unspecified diastolic (congestive) heart failure: Secondary | ICD-10-CM | POA: Diagnosis not present

## 2017-05-01 LAB — ECHOCARDIOGRAM COMPLETE
EERAT: 9.55
EWDT: 229 ms
FS: 35 % (ref 28–44)
IV/PV OW: 0.67
LA diam end sys: 25 mm
LA vol index: 19.6 mL/m2
LA vol: 33 mL
LADIAMINDEX: 1.49 cm/m2
LASIZE: 25 mm
LAVOLA4C: 30 mL
LDCA: 3.14 cm2
LV E/e' medial: 9.55
LV E/e'average: 9.55
LV PW d: 16.4 mm — AB (ref 0.6–1.1)
LV TDI E'LATERAL: 6.62
LV TDI E'MEDIAL: 4.14
LV e' LATERAL: 6.62 cm/s
LVOT diameter: 20 mm
MV Dec: 229
MV pk A vel: 93.8 m/s
MV pk E vel: 63.2 m/s

## 2017-05-06 ENCOUNTER — Telehealth: Payer: Self-pay | Admitting: Cardiology

## 2017-05-06 NOTE — Telephone Encounter (Signed)
Pt's daughter is aware of echo results.

## 2017-05-06 NOTE — Telephone Encounter (Signed)
Follow Up:   Returning Pam's call from yesterday,concerning pt's Echo results please.

## 2017-06-05 ENCOUNTER — Ambulatory Visit (INDEPENDENT_AMBULATORY_CARE_PROVIDER_SITE_OTHER): Payer: Medicare Other | Admitting: Neurology

## 2017-06-05 ENCOUNTER — Encounter: Payer: Self-pay | Admitting: Neurology

## 2017-06-05 VITALS — BP 154/92 | HR 88 | Resp 20

## 2017-06-05 DIAGNOSIS — F0391 Unspecified dementia with behavioral disturbance: Secondary | ICD-10-CM

## 2017-06-05 DIAGNOSIS — F03B18 Unspecified dementia, moderate, with other behavioral disturbance: Secondary | ICD-10-CM

## 2017-06-05 MED ORDER — DIVALPROEX SODIUM ER 250 MG PO TB24: ORAL_TABLET | ORAL | 11 refills | Status: DC

## 2017-06-05 MED ORDER — DONEPEZIL HCL 10 MG PO TABS: ORAL_TABLET | ORAL | 3 refills | Status: DC
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
## 2017-06-05 NOTE — Patient Instructions (Addendum)
1. Increase Depakote ER 250mg : Take 2 tablets at night 2. Increase Donepezil to 10mg  daily: With your current bottle of donepezil 5mg : take 2 tablets at night, then once done, your new bottle will be for donepezil 10mg , take 1 tablet at night 3. Follow-up in 6 months, call for any changes

## 2017-11-10 ENCOUNTER — Other Ambulatory Visit: Payer: Self-pay

## 2017-11-10 MED ORDER — FUROSEMIDE 40 MG PO TABS
40.0000 mg | ORAL_TABLET | ORAL | 2 refills | Status: DC
Start: 2017-11-10 — End: 2018-11-04

## 2017-11-10 NOTE — Telephone Encounter (Signed)
Rx for Lasix refilled for 90 days

## 2017-11-24 ENCOUNTER — Ambulatory Visit: Payer: Medicare Other | Admitting: Cardiology

## 2017-11-25 ENCOUNTER — Ambulatory Visit: Payer: Self-pay | Admitting: Neurology

## 2017-12-01 ENCOUNTER — Encounter: Payer: Self-pay | Admitting: Neurology

## 2017-12-01 ENCOUNTER — Ambulatory Visit (INDEPENDENT_AMBULATORY_CARE_PROVIDER_SITE_OTHER): Payer: Medicare Other | Admitting: Neurology

## 2017-12-01 DIAGNOSIS — F0391 Unspecified dementia with behavioral disturbance: Secondary | ICD-10-CM

## 2017-12-01 DIAGNOSIS — F03B18 Unspecified dementia, moderate, with other behavioral disturbance: Secondary | ICD-10-CM

## 2017-12-01 MED ORDER — DIVALPROEX SODIUM ER 250 MG PO TB24: ORAL_TABLET | ORAL | 3 refills | Status: DC

## 2017-12-01 NOTE — Progress Notes (Signed)
NEUROLOGY FOLLOW UP OFFICE NOTE  Joan Hunter [id number] 81/26/1928  HISTORY OF PRESENT ILLNESS: I had the pleasure of seeing Joan Hunter in follow-up in the neurology clinic on 12/01/2017.  The patient was last seen 6 months ago for moderate dementia, likely Lewy Body dementia with significant hallucinations. She is again accompanied by her daughter who helps provides the history today. MRI brain showed moderate to severe chronic microvascular disease. She had side effects on higher dose of Depakote ("she was talking real crazy"), her daughter has been giving her Depakote 250mg  qhs which she is tolerating better. Sleep is good at night, but her daughter reports she sleeps a lot during the day as well. She continues to have hallucinations about the man and his family downstairs. It does not scare her, but she cusses at them saying why are they here. Yesterday she told her daughter that she pinched her nose while laying in bed, but her daughter was not home that time. She has not wandered out of the house any longer, her daughter locks the doors when she has to go out to the store. For longer periods, the patient stays with her granddaughter or other family members. She uses adult pads and one time wrapped them all up in toilet paper and hid them in the closet. Her daughter has to repeatedly remind her what to do. She has to remind her to flush the toilet or take a bath. She is able to dress herself. She denies any headaches, dizziness, focal numbness/tingling/weakness, no falls.   HPI 12/06/2016: This is an 83 yo RH woman with a history of diabetes, hypertension, CAD, lung cancer, with worsening memory and hallucinations. When asked about her memory, she states there are some things she remembers and some she does not. She lives with her daughter and states that she cooks (she does not). She denies misplacing things but daughter shakes her head. Her daughter administers medications, otherwise  she would not take them. Her daughter states that memory changes started only around 2 months ago, prior to this it was "pretty good." She started having difficulties remembering the days of the week, asking her grandson why he was not in school, then a few minutes later asking him the same question. She used to fix her own medications, but over the past month, her daughter has to physically watch her take them. She used to live by herself but after 2 falls, her daughter has lived with her for the past 2 years. Daughter has always been in charge of bill payments. She stopped driving 5 years ago due to bad knees. She denied getting lost driving, her daughter states she can still give directions when someone else is driving.  Her daughter's main concern are the hallucinations. The patient thinks these are real and denies having any hallucinations. Her daughter cites multiple instances, these started in June but have been worsening. She would talk about people across the street or someone sitting outside, a dog across the street in the car. She told her daughter she is in trouble with the people she works with, that they were downstairs. She has seen a cat on the bed, or something under the bed, something eating her shoes. She would ask her daughter if the man downstairs was gone. She said there was a green car in the driveway, which was actually a large tarp. She saw a little boy sitting up on the tree outside. She called her daughter "Momma" this morning. She  denies any auditory hallucinations. Her daughter denies any personality changes, no paranoia associated with these hallucinations. The patient denies any headaches, dizziness, diplopia, dysarthria, dysphagia, neck/back pain, focal numbness/tingling/weakness, bowel/bladder dysfunction. No anosmia, tremors, no falls. They deny any significant head injuries. No family history of dementia.  PAST MEDICAL HISTORY: Past Medical History:  Diagnosis Date  .  Allergic rhinitis   . Arthritis   . CAD (coronary artery disease)    mod 2V CAD (60% LAD, 70-80% mid CX) by 01/2008 cath  . CHF (congestive heart failure) ([place])   . Diabetes mellitus    dx 10 plus yrs ago  . Dilated cardiomyopathy ([place])    now resolved, EF 60-65% echo 10/11/11 (Dr. Marlou Porch)  . Diverticulosis   . Hyperlipidemia   . Hypertension   . Hypokalemia 01/09/2017  . Lung mass   . MR (mitral regurgitation)   . Osteopenia     MEDICATIONS: Current Outpatient Medications on File Prior to Visit  Medication Sig Dispense Refill  . amLODipine (NORVASC) 5 MG tablet Take 5 mg by mouth at bedtime.    Marland Kitchen aspirin 81 MG tablet Take 81 mg by mouth daily.    Marland Kitchen atorvastatin (LIPITOR) 40 MG tablet Take 40 mg by mouth daily at 6 PM.     . bisoprolol (ZEBETA) 10 MG tablet Take 1 tablet (10 mg total) by mouth daily. 30 tablet 11  . divalproex (DEPAKOTE ER) 250 MG 24 hr tablet Take 2 tablets at night 60 tablet 11  . donepezil (ARICEPT) 10 MG tablet Take 1 tablet daily 90 tablet 3  . furosemide (LASIX) 40 MG tablet Take 1 tablet (40 mg total) by mouth every other day. 45 tablet 2  . glipiZIDE (GLUCOTROL) 5 MG tablet Take 2.5 mg by mouth daily.    . mirtazapine (REMERON) 15 MG tablet Take 15 mg by mouth at bedtime. Pt taking 1/2 tabf at bedtime    . pantoprazole (PROTONIX) 40 MG tablet Take 40 mg by mouth daily.     . potassium chloride SA (K-DUR,KLOR-CON) 20 MEQ tablet Take 2 tablets (40 mEq total) by mouth daily. 20 tablet 0  . PROAIR HFA 108 (90 Base) MCG/ACT inhaler Inhale 2 puffs into the lungs every 6 (six) hours as needed for wheezing or shortness of breath.  6  . traMADol (ULTRAM) 50 MG tablet Take 50 mg by mouth 2 (two) times daily as needed for pain. for pain  0   No current facility-administered medications on file prior to visit.     ALLERGIES: Allergies  Allergen Reactions  . Moxifloxacin     REACTION: hives  . Penicillins     REACTION: hives  . Prednisone     REACTION: hives     FAMILY HISTORY: Family History  Problem Relation Age of Onset  . Colon cancer Mother   . Heart disease Brother     SOCIAL HISTORY: Social History   Socioeconomic History  . Marital status: Widowed    Spouse name: Not on file  . Number of children: Not on file  . Years of education: Not on file  . Highest education level: Not on file  Social Needs  . Financial resource strain: Not on file  . Food insecurity - worry: Not on file  . Food insecurity - inability: Not on file  . Transportation needs - medical: Not on file  . Transportation needs - non-medical: Not on file  Occupational History  . Not on file  Tobacco Use  .  Smoking status: Former Smoker    Packs/day: 0.50    Types: Cigarettes    Last attempt to quit: 06/15/1972    Years since quitting: 45.4  . Smokeless tobacco: Never Used  Substance and Sexual Activity  . Alcohol use: No  . Drug use: No  . Sexual activity: Not on file  Other Topics Concern  . Not on file  Social History Narrative  . Not on file    REVIEW OF SYSTEMS: Constitutional: No fevers, chills, or sweats, no generalized fatigue, change in appetite Eyes: No visual changes, double vision, eye pain Ear, nose and throat: No hearing loss, ear pain, nasal congestion, sore throat Cardiovascular: No chest pain, palpitations Respiratory:  No shortness of breath at rest or with exertion, wheezes GastrointestinaI: No nausea, vomiting, diarrhea, abdominal pain, fecal incontinence Genitourinary:  No dysuria, urinary retention or frequency Musculoskeletal:  No neck pain, back pain Integumentary: No rash, pruritus, skin lesions Neurological: as above Psychiatric: No depression, insomnia, anxiety Endocrine: No palpitations, fatigue, diaphoresis, mood swings, change in appetite, change in weight, increased thirst Hematologic/Lymphatic:  No anemia, purpura, petechiae. Allergic/Immunologic: no itchy/runny eyes, nasal congestion, recent allergic reactions,  rashes  PHYSICAL EXAM: Vitals:   12/01/17 1212  BP: 122/84  Pulse: 82  Resp: 18   General: No acute distress Head:  Normocephalic/atraumatic Neck: supple, no paraspinal tenderness, full range of motion Heart:  Regular rate and rhythm Lungs:  Clear to auscultation bilaterally Back: No paraspinal tenderness Skin/Extremities: No rash, no edema Neurological Exam: alert and oriented to person, place, date but not year. No aphasia or dysarthria. Fund of knowledge is reduced. Recent and remote memory are impaired. 0/3 delayed recall.Attention and concentration are normal, refused to spell WORLD backward. Able to name objects and repeat phrases. Cranial nerves: Pupils equal, round, reactive to light.  Extraocular movements intact with no nystagmus. Visual fields full. Facial sensation intact. No facial asymmetry. Tongue, uvula, palate midline.  Motor: Cogwheeling bilaterally, muscle strength 5/5 throughout with no pronator drift.  Sensation to light touch intact.  No extinction to double simultaneous stimulation.  Deep tendon reflexes +1 throughout, toes downgoing.  Finger to nose testing intact.  Gait narrow-based with walker, no ataxia.  IMPRESSION: This is an 83 yo RH woman with a history of diabetes, hypertension, CAD, lung cancer, and moderate dementia with behavioral disturbance. Her daughter reports symptoms started with hallucinations, suggestive of Lewy body dementia. MRI brain did not show any acute changes, there was moderate to severe chronic microvascular disease.She is taking Donepezil 10mg  daily. She is also on low dose Depakote ER 250mg  qhs, higher doses caused side effects. She continues to have hallucinations, we discussed treatment options and side effects (Seroquel, black box cardiac warning), her daughter agrees to hold off on these types of medications for now due to concern for side effects. Family provides 24/7 care, continue to monitor home safety, we again discussed that she may  need higher level of care at a later point. They will be set up with [facility] through the [employer] to help with local resources and support groups. She does not drive. She will follow-up in 6 months and knows to call for any changes  Thank you for allowing me to participate in her care.  Please do not hesitate to call for any questions or concerns.  The duration of this appointment visit was 25 minutes of face-to-face time with the patient.  Greater than 50% of this time was spent in counseling, explanation of diagnosis, planning  of further management, and coordination of care.   Ellouise Newer, M.D.   CC: Dr. [name]

## 2017-12-01 NOTE — Patient Instructions (Signed)
1. Continue Donepezil 10mg  daily 2. Continue Depakote ER 250mg  at bedtime 3. We will get you set up with [facility] through the [employer] to help with local resources and support groups 4. Follow-up in 6 months, call for any changes  FALL PRECAUTIONS: Be cautious when walking. Scan the area for obstacles that may increase the risk of trips and falls. When getting up in the mornings, sit up at the edge of the bed for a few minutes before getting out of bed. Consider elevating the bed at the head end to avoid drop of blood pressure when getting up. Walk always in a well-lit room (use night lights in the walls). Avoid area rugs or power cords from appliances in the middle of the walkways. Use a walker or a cane if necessary and consider physical therapy for balance exercise. Get your eyesight checked regularly.  FINANCIAL OVERSIGHT: Supervision, especially oversight when making financial decisions or transactions is also recommended.  HOME SAFETY: Consider the safety of the kitchen when operating appliances like stoves, microwave oven, and blender. Consider having supervision and share cooking responsibilities until no longer able to participate in those. Accidents with firearms and other hazards in the house should be identified and addressed as well.  DRIVING: Regarding driving, in patients with progressive memory problems, driving will be impaired. We advise to have someone else do the driving if trouble finding directions or if minor accidents are reported. Independent driving assessment is available to determine safety of driving.  ABILITY TO BE LEFT ALONE: If patient is unable to contact 911 operator, consider using [facility], or when the need is there, arrange for someone to stay with patients. Smoking is a fire hazard, consider supervision or cessation. Risk of wandering should be assessed by caregiver and if detected at any point, supervision and safe proof recommendations should  be instituted.  MEDICATION SUPERVISION: Inability to self-administer medication needs to be constantly addressed. Implement a mechanism to ensure safe administration of the medications.  RECOMMENDATIONS FOR ALL PATIENTS WITH MEMORY PROBLEMS: 1. Continue to exercise (Recommend 30 minutes of walking everyday, or 3 hours every week) 2. Increase social interactions - continue going to [employer] and enjoy social gatherings with friends and family 3. Eat healthy, avoid fried foods and eat more fruits and vegetables 4. Maintain adequate blood pressure, blood sugar, and blood cholesterol level. Reducing the risk of stroke and cardiovascular disease also helps promoting better memory. 5. Avoid stressful situations. Live a simple life and avoid aggravations. Organize your time and prepare for the next day in anticipation. 6. Sleep well, avoid any interruptions of sleep and avoid any distractions in the bedroom that may interfere with adequate sleep quality 7. Avoid sugar, avoid sweets as there is a strong link between excessive sugar intake, diabetes, and cognitive impairment We discussed the Mediterranean diet, which has been shown to help patients reduce the risk of progressive memory disorders and reduces cardiovascular risk. This includes eating fish, eat fruits and green leafy vegetables, nuts like almonds and hazelnuts, walnuts, and also use olive oil. Avoid fast foods and fried foods as much as possible. Avoid sweets and sugar as sugar use has been linked to worsening of memory function.  There is always a concern of gradual progression of memory problems. If this is the case, then we may need to adjust level of care according to patient needs. Support, both to the patient and caregiver, should then be put into place.

## 2017-12-25 ENCOUNTER — Ambulatory Visit: Payer: Self-pay | Admitting: Internal Medicine

## 2017-12-25 ENCOUNTER — Inpatient Hospital Stay: Payer: Medicare Other | Attending: Internal Medicine

## 2017-12-25 ENCOUNTER — Ambulatory Visit (HOSPITAL_COMMUNITY)
Admission: RE | Admit: 2017-12-25 | Discharge: 2017-12-25 | Disposition: A | Discharge status: Home / Self Care | Payer: Medicare Other | Source: Ambulatory Visit | Attending: Internal Medicine | Admitting: Internal Medicine

## 2017-12-25 DIAGNOSIS — E119 Type 2 diabetes mellitus without complications: Secondary | ICD-10-CM | POA: Insufficient documentation

## 2017-12-25 DIAGNOSIS — I700 Atherosclerosis of aorta: Secondary | ICD-10-CM | POA: Insufficient documentation

## 2017-12-25 DIAGNOSIS — Z79899 Other long term (current) drug therapy: Secondary | ICD-10-CM | POA: Insufficient documentation

## 2017-12-25 DIAGNOSIS — Z90710 Acquired absence of both cervix and uterus: Secondary | ICD-10-CM | POA: Insufficient documentation

## 2017-12-25 DIAGNOSIS — J40 Bronchitis, not specified as acute or chronic: Secondary | ICD-10-CM | POA: Diagnosis not present

## 2017-12-25 DIAGNOSIS — Z9049 Acquired absence of other specified parts of digestive tract: Secondary | ICD-10-CM | POA: Insufficient documentation

## 2017-12-25 DIAGNOSIS — E785 Hyperlipidemia, unspecified: Secondary | ICD-10-CM | POA: Insufficient documentation

## 2017-12-25 DIAGNOSIS — I509 Heart failure, unspecified: Secondary | ICD-10-CM | POA: Insufficient documentation

## 2017-12-25 DIAGNOSIS — R918 Other nonspecific abnormal finding of lung field: Secondary | ICD-10-CM | POA: Insufficient documentation

## 2017-12-25 DIAGNOSIS — Z7982 Long term (current) use of aspirin: Secondary | ICD-10-CM | POA: Diagnosis not present

## 2017-12-25 DIAGNOSIS — R590 Localized enlarged lymph nodes: Secondary | ICD-10-CM | POA: Diagnosis not present

## 2017-12-25 DIAGNOSIS — I340 Nonrheumatic mitral (valve) insufficiency: Secondary | ICD-10-CM | POA: Diagnosis not present

## 2017-12-25 DIAGNOSIS — C3491 Malignant neoplasm of unspecified part of right bronchus or lung: Secondary | ICD-10-CM

## 2017-12-25 DIAGNOSIS — I10 Essential (primary) hypertension: Secondary | ICD-10-CM | POA: Diagnosis not present

## 2017-12-25 DIAGNOSIS — M129 Arthropathy, unspecified: Secondary | ICD-10-CM | POA: Diagnosis not present

## 2017-12-25 DIAGNOSIS — I2510 Atherosclerotic heart disease of native coronary artery without angina pectoris: Secondary | ICD-10-CM | POA: Insufficient documentation

## 2017-12-25 DIAGNOSIS — K449 Diaphragmatic hernia without obstruction or gangrene: Secondary | ICD-10-CM | POA: Insufficient documentation

## 2017-12-25 DIAGNOSIS — Z85118 Personal history of other malignant neoplasm of bronchus and lung: Secondary | ICD-10-CM | POA: Diagnosis not present

## 2017-12-25 DIAGNOSIS — E876 Hypokalemia: Secondary | ICD-10-CM

## 2017-12-25 DIAGNOSIS — R5383 Other fatigue: Secondary | ICD-10-CM | POA: Insufficient documentation

## 2017-12-25 DIAGNOSIS — R05 Cough: Secondary | ICD-10-CM | POA: Diagnosis not present

## 2017-12-25 DIAGNOSIS — I429 Cardiomyopathy, unspecified: Secondary | ICD-10-CM | POA: Diagnosis not present

## 2017-12-25 DIAGNOSIS — Z7984 Long term (current) use of oral hypoglycemic drugs: Secondary | ICD-10-CM | POA: Diagnosis not present

## 2017-12-25 DIAGNOSIS — M8580 Other specified disorders of bone density and structure, unspecified site: Secondary | ICD-10-CM | POA: Diagnosis not present

## 2017-12-25 LAB — COMPREHENSIVE METABOLIC PANEL
ALBUMIN: 3.5 g/dL (ref 3.5–5.0)
ALK PHOS: 101 U/L (ref 40–150)
ALT: 11 U/L (ref 0–55)
ANION GAP: 12 — AB (ref 3–11)
AST: 16 U/L (ref 5–34)
BILIRUBIN TOTAL: 0.5 mg/dL (ref 0.2–1.2)
BUN: 20 mg/dL (ref 7–26)
CALCIUM: 9 mg/dL (ref 8.4–10.4)
CO2: 24 mmol/L (ref 22–29)
CREATININE: 1.11 mg/dL — AB (ref 0.60–1.10)
Chloride: 108 mmol/L (ref 98–109)
GFR calc Af Amer: 50 mL/min — ABNORMAL LOW (ref >60)
GFR calc non Af Amer: 43 mL/min — ABNORMAL LOW (ref >60)
GLUCOSE: 106 mg/dL (ref 70–140)
Potassium: 4 mmol/L (ref 3.3–4.7)
SODIUM: 144 mmol/L (ref 136–145)
TOTAL PROTEIN: 7.9 g/dL (ref 6.4–8.3)

## 2017-12-25 LAB — CBC WITH DIFFERENTIAL/PLATELET
BASOS ABS: 0 10*3/uL (ref 0.0–0.1)
BASOS PCT: 0 %
EOS ABS: 0.1 10*3/uL (ref 0.0–0.5)
Eosinophils Relative: 2 %
HCT: 41.8 % (ref 34.8–46.6)
Hemoglobin: 13.1 g/dL (ref 11.6–15.9)
Lymphocytes Relative: 23 %
Lymphs Abs: 1.4 10*3/uL (ref 0.9–3.3)
MCH: 28.4 pg (ref 25.1–34.0)
MCHC: 31.3 g/dL — ABNORMAL LOW (ref 31.5–36.0)
MCV: 90.5 fL (ref 79.5–101.0)
MONO ABS: 0.6 10*3/uL (ref 0.1–0.9)
MONOS PCT: 9 %
NEUTROS ABS: 4 10*3/uL (ref 1.5–6.5)
Neutrophils Relative %: 66 %
Platelets: 192 10*3/uL (ref 145–400)
RBC: 4.62 MIL/uL (ref 3.70–5.45)
RDW: 14.6 % (ref 11.2–16.1)
WBC: 6.1 10*3/uL (ref 3.9–10.3)

## 2017-12-25 MED ORDER — IOPAMIDOL (ISOVUE-300) INJECTION 61%
100.0000 mL | Freq: Once | INTRAVENOUS | Status: AC | PRN
  Administered 2017-12-25: 75 mL via INTRAVENOUS

## 2017-12-25 MED ORDER — IOPAMIDOL (ISOVUE-300) INJECTION 61%
INTRAVENOUS | Status: AC
  Filled 2017-12-25: qty 100

## 2018-01-01 ENCOUNTER — Encounter: Payer: Self-pay | Admitting: Internal Medicine

## 2018-01-01 ENCOUNTER — Telehealth: Payer: Self-pay | Admitting: Internal Medicine

## 2018-01-01 ENCOUNTER — Inpatient Hospital Stay (HOSPITAL_BASED_OUTPATIENT_CLINIC_OR_DEPARTMENT_OTHER): Payer: Medicare Other | Admitting: Internal Medicine

## 2018-01-01 DIAGNOSIS — I509 Heart failure, unspecified: Secondary | ICD-10-CM | POA: Diagnosis not present

## 2018-01-01 DIAGNOSIS — E876 Hypokalemia: Secondary | ICD-10-CM | POA: Diagnosis not present

## 2018-01-01 DIAGNOSIS — M8580 Other specified disorders of bone density and structure, unspecified site: Secondary | ICD-10-CM

## 2018-01-01 DIAGNOSIS — Z85118 Personal history of other malignant neoplasm of bronchus and lung: Secondary | ICD-10-CM | POA: Diagnosis not present

## 2018-01-01 DIAGNOSIS — I10 Essential (primary) hypertension: Secondary | ICD-10-CM

## 2018-01-01 DIAGNOSIS — E785 Hyperlipidemia, unspecified: Secondary | ICD-10-CM

## 2018-01-01 DIAGNOSIS — Z7982 Long term (current) use of aspirin: Secondary | ICD-10-CM

## 2018-01-01 DIAGNOSIS — E119 Type 2 diabetes mellitus without complications: Secondary | ICD-10-CM

## 2018-01-01 DIAGNOSIS — Z79899 Other long term (current) drug therapy: Secondary | ICD-10-CM

## 2018-01-01 DIAGNOSIS — R5383 Other fatigue: Secondary | ICD-10-CM

## 2018-01-01 DIAGNOSIS — R05 Cough: Secondary | ICD-10-CM

## 2018-01-01 DIAGNOSIS — I2510 Atherosclerotic heart disease of native coronary artery without angina pectoris: Secondary | ICD-10-CM | POA: Diagnosis not present

## 2018-01-01 DIAGNOSIS — M129 Arthropathy, unspecified: Secondary | ICD-10-CM | POA: Diagnosis not present

## 2018-01-01 DIAGNOSIS — I340 Nonrheumatic mitral (valve) insufficiency: Secondary | ICD-10-CM

## 2018-01-01 DIAGNOSIS — J40 Bronchitis, not specified as acute or chronic: Secondary | ICD-10-CM | POA: Diagnosis not present

## 2018-01-01 DIAGNOSIS — I700 Atherosclerosis of aorta: Secondary | ICD-10-CM

## 2018-01-01 DIAGNOSIS — C3490 Malignant neoplasm of unspecified part of unspecified bronchus or lung: Secondary | ICD-10-CM

## 2018-01-01 DIAGNOSIS — I429 Cardiomyopathy, unspecified: Secondary | ICD-10-CM

## 2018-01-01 DIAGNOSIS — K449 Diaphragmatic hernia without obstruction or gangrene: Secondary | ICD-10-CM

## 2018-01-01 NOTE — Progress Notes (Signed)
[facility] [phone]   [phone]  OFFICE PROGRESS NOTE  [name], MD [address] [place] [address]  DIAGNOSIS: Stage IA (T1b, N0, M0) non-small cell lung cancer consistent with adenocarcinoma involving the right lower lobe, diagnosed in September 2014.  PRIOR THERAPY: Status post curative stereotactic radiotherapy to the right lower lobe lung nodule under the care of Dr. Tammi Klippel completed on 10/29/2013.  CURRENT THERAPY: Observation.  INTERVAL HISTORY: Joan Hunter 84 y.o. female returns to the clinic today for annual follow-up visit accompanied by her daughter.  The patient is feeling fine today with no specific complaints except for recent bronchitis and she is currently on Z-Pak.  She did not take her blood pressure medication this morning and her blood pressure and heart rate are elevated.  The patient denied having any chest pain, shortness of breath or hemoptysis.  She denied having any recent weight loss or night sweats.  She had a repeat CT scan of the chest performed recently and she is here for evaluation and discussion of her scan results.  MEDICAL HISTORY: Past Medical History:  Diagnosis Date  . Allergic rhinitis   . Arthritis   . CAD (coronary artery disease)    mod 2V CAD (60% LAD, 70-80% mid CX) by 01/2008 cath  . CHF (congestive heart failure) ([facility] [place])   . Diabetes mellitus    dx 10 plus yrs ago  . Dilated cardiomyopathy ([place])    now resolved, EF 60-65% echo 10/11/11 (Dr. Marlou Porch)  . Diverticulosis   . Hyperlipidemia   . Hypertension   . Hypokalemia 01/09/2017  . Lung mass   . MR (mitral regurgitation)   . Osteopenia     ALLERGIES:  is allergic to moxifloxacin; penicillins; and prednisone.  MEDICATIONS:  Current Outpatient Medications  Medication Sig Dispense Refill  . amLODipine (NORVASC) 5 MG tablet Take 5 mg by mouth at bedtime.    Marland Kitchen aspirin 81 MG tablet Take 81 mg by mouth daily.     Marland Kitchen atorvastatin (LIPITOR) 40 MG tablet Take 40 mg by mouth daily at 6 PM.     . bisoprolol (ZEBETA) 10 MG tablet Take 1 tablet (10 mg total) by mouth daily. 30 tablet 11  . divalproex (DEPAKOTE ER) 250 MG 24 hr tablet Take 1 tablet at night 90 tablet 3  . donepezil (ARICEPT) 10 MG tablet Take 1 tablet daily 90 tablet 3  . furosemide (LASIX) 40 MG tablet Take 1 tablet (40 mg total) by mouth every other day. 45 tablet 2  . glipiZIDE (GLUCOTROL) 5 MG tablet Take 2.5 mg by mouth daily.    . mirtazapine (REMERON) 15 MG tablet Take 15 mg by mouth at bedtime. Pt taking 1/2 tabf at bedtime    . pantoprazole (PROTONIX) 40 MG tablet Take 40 mg by mouth daily.     . potassium chloride SA (K-DUR,KLOR-CON) 20 MEQ tablet Take 2 tablets (40 mEq total) by mouth daily. 20 tablet 0  . PROAIR HFA 108 (90 Base) MCG/ACT inhaler Inhale 2 puffs into the lungs every 6 (six) hours as needed for wheezing or shortness of breath.  6  . traMADol (ULTRAM) 50 MG tablet Take 50 mg by mouth 2 (two) times daily as needed for pain. for pain  0   No current facility-administered medications for this visit.     SURGICAL HISTORY:  Past Surgical History:  Procedure Laterality Date  . ABDOMINAL HYSTERECTOMY    .  APPENDECTOMY    . EYE SURGERY     bil cataracts  . KNEE ARTHROSCOPY     ?  left  knee  . TONSILLECTOMY      REVIEW OF SYSTEMS:  A comprehensive review of systems was negative except for: Constitutional: positive for fatigue Respiratory: positive for cough   PHYSICAL EXAMINATION: General appearance: alert, cooperative, fatigued and no distress Head: Normocephalic, without obvious abnormality, atraumatic Neck: no adenopathy, no JVD, supple, symmetrical, trachea midline and thyroid not enlarged, symmetric, no tenderness/mass/nodules Lymph nodes: Cervical, supraclavicular, and axillary nodes normal. Resp: clear to auscultation bilaterally Back: symmetric, no curvature. ROM normal. No CVA tenderness. Cardio: regular  rate and rhythm, S1, S2 normal, no murmur, click, rub or gallop GI: soft, non-tender; bowel sounds normal; no masses,  no organomegaly Extremities: extremities normal, atraumatic, no cyanosis or edema  ECOG PERFORMANCE STATUS: 1 - Symptomatic but completely ambulatory  Blood pressure (!) 160/97, pulse (!) 132, temperature 98.3 F (36.8 C), temperature source Oral, resp. rate 18, height 5\' 5"  (1.651 m), weight 137 lb 9.6 oz (62.4 kg), SpO2 97 %.  LABORATORY DATA: Lab Results  Component Value Date   WBC 6.1 12/25/2017   HGB 13.1 12/25/2017   HCT 41.8 12/25/2017   MCV 90.5 12/25/2017   PLT 192 12/25/2017      Chemistry      Component Value Date/Time   NA 144 12/25/2017 1100   NA 144 12/26/2016 1022   K 4.0 12/25/2017 1100   K 2.9 (LL) 12/26/2016 1022   CL 108 12/25/2017 1100   CO2 24 12/25/2017 1100   CO2 29 12/26/2016 1022   BUN 20 12/25/2017 1100   BUN 16.7 12/26/2016 1022   CREATININE 1.11 (H) 12/25/2017 1100   CREATININE 1.0 12/26/2016 1022      Component Value Date/Time   CALCIUM 9.0 12/25/2017 1100   CALCIUM 9.3 12/26/2016 1022   ALKPHOS 101 12/25/2017 1100   ALKPHOS 105 12/26/2016 1022   AST 16 12/25/2017 1100   AST 16 12/26/2016 1022   ALT 11 12/25/2017 1100   ALT 8 12/26/2016 1022   BILITOT 0.5 12/25/2017 1100   BILITOT 0.84 12/26/2016 1022       RADIOGRAPHIC STUDIES: Ct Chest W Contrast  Result Date: 12/25/2017 CLINICAL DATA:  84 year old female with history of right-sided lung cancer diagnosed in 2013 status post radiation therapy which is now complete. Follow-up study. EXAM: CT CHEST WITH CONTRAST TECHNIQUE: Multidetector CT imaging of the chest was performed during intravenous contrast administration. CONTRAST:  21mL ISOVUE-300 IOPAMIDOL (ISOVUE-300) INJECTION 61% COMPARISON:  Multiple priors, most recently chest CT 12/26/2016. FINDINGS: Cardiovascular: Heart size is normal. There is no significant pericardial fluid, thickening or pericardial  calcification. There is aortic atherosclerosis, as well as atherosclerosis of the great vessels of the mediastinum and the coronary arteries, including calcified atherosclerotic plaque in the left main, left anterior descending, left circumflex and right coronary arteries. Calcifications of the aortic valve. Mediastinum/Nodes: Mildly enlarged right hilar lymph node (axial image 62 of series 2), which is definitively new compared to prior study 01/02/2016, but difficult to assess on prior noncontrast CT 12/26/2016. No other mediastinal or hilar lymphadenopathy is noted. Large hiatal hernia. No axillary lymphadenopathy. Lungs/Pleura: There continues to be extensive architectural distortion throughout the central aspect of the right lung, with a persistent mass-like area measuring 3.2 x 3.8 cm (axial image 54 of series 2) which is very similar to the prior study from 12/26/2016, presumably an area of chronic postradiation mass-like  fibrosis. Several scattered small pulmonary nodules are noted, generally similar in size, number and distribution to the prior examination with the largest cluster of these in the medial segment of the right middle lobe. Some of these have decreased in size, most notable for a 5 mm right middle lobe nodule (axial image 74 of series 5) which previously measured 7 mm. The largest previously noted nodule in the periphery of the right lower lobe is stable in size measuring 12 x 11 mm, and is very similar on numerous prior examinations dating back to at least 2014, presumably benign lesion. No acute consolidative airspace disease. No pleural effusions. Upper Abdomen: Aortic atherosclerosis. Musculoskeletal: There are no aggressive appearing lytic or blastic lesions noted in the visualized portions of the skeleton. Chronic compression of the anterior aspect of T5 vertebral body with 70% loss of anterior vertebral body height, and chronic compression of superior endplate of T6 with acute kyphotic  deformity at this level, similar to prior studies. IMPRESSION: 1. Interval development of mildly enlarged right hilar lymph node. This is nonspecific but warrants continued attention on follow-up studies. Chronic area in the perihilar of postradiation aspect of the right lung is stable compared to prior studies. Mass-like fibrosis 2. Multiple small pulmonary nodules scattered throughout the lungs bilaterally generally stable or slightly decreased in size compared to prior examinations, suggesting benign etiologies. 3. Large hiatal hernia. 4. Aortic atherosclerosis, in addition to left main and 3 vessel coronary artery disease. 5. There are calcifications of the aortic valve. Echocardiographic correlation for evaluation of potential valvular dysfunction may be warranted if clinically indicated. Aortic Atherosclerosis (ICD10-I70.0). Electronically Signed   By: Vinnie Langton M.D.   On: 12/25/2017 15:33   ASSESSMENT AND PLAN:  This is a very pleasant 84 years old African-American female with stage IA non-small cell lung cancer. She status post curative SBRT to the right lower lobe nodule completed in November 2014. The patient is currently on observation. The patient is doing fine today except for the recent bronchitis and she is currently on treatment with Z-Pak. Her recent CT scan of the chest showed no concerning findings for disease progression except for mildly enlarged right hilar lymph node.  This could be secondary to her recent inflammatory process but disease recurrence could not be excluded. I recommended for the patient to continue on observation with repeat CT scan of the chest in 6 months for reevaluation of this area. She was also advised to call immediately if she has any concerning symptoms in the interval. The patient voices understanding of current disease status and treatment options and is in agreement with the current care plan. All questions were answered. The patient knows to call  the clinic with any problems, questions or concerns. We can certainly see the patient much sooner if necessary. I spent 10 minutes counseling the patient face to face. The total time spent in the appointment was 15 minutes. Disclaimer: This note was dictated with voice recognition software. Similar sounding words can inadvertently be transcribed and may not be corrected upon review.

## 2018-01-01 NOTE — Telephone Encounter (Signed)
Scheduled appt per 11/7 los - Gave patient AVS and calender per los.  

## 2018-01-13 ENCOUNTER — Encounter: Payer: Self-pay | Admitting: Cardiology

## 2018-01-13 ENCOUNTER — Ambulatory Visit: Payer: Medicare Other | Admitting: Cardiology

## 2018-01-13 VITALS — BP 178/118 | HR 72 | Ht 65.0 in | Wt 143.4 lb

## 2018-01-13 DIAGNOSIS — I2510 Atherosclerotic heart disease of native coronary artery without angina pectoris: Secondary | ICD-10-CM

## 2018-01-13 DIAGNOSIS — I2583 Coronary atherosclerosis due to lipid rich plaque: Secondary | ICD-10-CM

## 2018-01-13 DIAGNOSIS — J8410 Pulmonary fibrosis, unspecified: Secondary | ICD-10-CM | POA: Diagnosis not present

## 2018-01-13 DIAGNOSIS — I340 Nonrheumatic mitral (valve) insufficiency: Secondary | ICD-10-CM

## 2018-01-13 DIAGNOSIS — I10 Essential (primary) hypertension: Secondary | ICD-10-CM

## 2018-01-13 MED ORDER — BISOPROLOL FUMARATE 10 MG PO TABS: 10.0000 mg | ORAL_TABLET | Freq: Every day | ORAL | 6 refills | Status: DC

## 2018-01-13 MED ORDER — AMLODIPINE BESYLATE 5 MG PO TABS: 5.0000 mg | ORAL_TABLET | Freq: Every day | ORAL | 6 refills | Status: DC

## 2018-01-13 NOTE — Progress Notes (Signed)
[facility]. [address], [place]  [address] Phone: [phone] Fax:  [phone]  Date:  01/13/2018   ID:  Joan Hunter, DOB 1927/12/07, MRN [id number]  PCP:  Joan Carol, MD   History of Present Illness: Joan Hunter is a 84 y.o. female with moderate coronary artery disease prior catheterization in 2009, prior idiopathic cardiomyopathy (EF was 15%) now resolved 55%, mitral regurgitation mild here for followup. 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 Dr. Tammi Hunter. Radiation only.   Dr. Melvyn Hunter changed Coreg to Bystolic to bisoprolol. COPD/bronchitis. Treating hiatal hernia with Protonix as well.   Continuing with atorvastatin, hyperlipidemia.  She experienced some dizziness when standing and her medications were previously reduced. This has improved.  Her daughter, Joan Hunter, helps her out significantly but she also make sure that she gets up and moves around adequately.   I encouraged her to use her walker. She has a split-level house. Concerned about stair use.   04/16/17-she has been having some more deconditioning, or shortness of breath with activity. Her daughter still has to motivate her to get up she states. She denies any chest pain. No orthopnea. No edema. No increase in weight.   01/13/17-mild shortness of breath with activity, no significant change.  She is trying to maintain her weight.  Mild shortness of breath with activity, she is trying to maintain her weight.  No significant change.  Her heart rate seems to be increased today.  Her daughter does not think that she has been taking her bisoprolol or amlodipine.  Her blood pressure is quite elevated as well. Heart rate 116 sinus tach   Wt Readings from Last 3 Encounters:  01/13/18 143 lb 6.4 oz (65 kg)  01/01/18 137 lb 9.6 oz (62.4 kg)  12/01/17 141 lb (64 kg)     Past Medical History:  Diagnosis Date  .  Allergic rhinitis   . Arthritis   . CAD (coronary artery disease)    mod 2V CAD (60% LAD, 70-80% mid CX) by 01/2008 cath  . CHF (congestive heart failure) ([facility])   . Diabetes mellitus    dx 10 plus yrs ago  . Dilated cardiomyopathy ([facility])    now resolved, EF 60-65% echo 10/11/11 (Dr. Marlou Hunter)  . Diverticulosis   . Hyperlipidemia   . Hypertension   . Hypokalemia 01/09/2017  . Lung mass   . MR (mitral regurgitation)   . Osteopenia     Past Surgical History:  Procedure Laterality Date  . ABDOMINAL HYSTERECTOMY    . APPENDECTOMY    . EYE SURGERY     bil cataracts  . KNEE ARTHROSCOPY     ?  left  knee  . TONSILLECTOMY      Current Outpatient Medications  Medication Sig Dispense Refill  . aspirin 81 MG tablet Take 81 mg by mouth daily.    Marland Kitchen atorvastatin (LIPITOR) 40 MG tablet Take 40 mg by mouth daily at 6 PM.     . divalproex (DEPAKOTE ER) 250 MG 24 hr tablet Take 1 tablet at night 90 tablet 3  . donepezil (ARICEPT) 10 MG tablet Take 1 tablet daily 90 tablet 3  . furosemide (LASIX) 40 MG tablet Take 1 tablet (40 mg total) by mouth every other day. 45 tablet 2  . glipiZIDE (GLUCOTROL) 5 MG tablet Take 2.5 mg by mouth daily.    . pantoprazole (PROTONIX) 40  MG tablet Take 40 mg by mouth daily.     . potassium chloride SA (K-DUR,KLOR-CON) 20 MEQ tablet Take 2 tablets (40 mEq total) by mouth daily. 20 tablet 0  . PROAIR HFA 108 (90 Base) MCG/ACT inhaler Inhale 2 puffs into the lungs every 6 (six) hours as needed for wheezing or shortness of breath.  6  . amLODipine (NORVASC) 5 MG tablet Take 1 tablet (5 mg total) by mouth at bedtime. 30 tablet 6  . bisoprolol (ZEBETA) 10 MG tablet Take 1 tablet (10 mg total) by mouth daily. 30 tablet 6   No current facility-administered medications for this visit.     Allergies:    Allergies  Allergen Reactions  . Moxifloxacin     REACTION: hives  . Penicillins     REACTION: hives  . Prednisone     REACTION: hives    Social History:  The  patient  reports that she quit smoking about 45 years ago. Her smoking use included cigarettes. She smoked 0.50 packs per day. she has never used smokeless tobacco. She reports that she does not drink alcohol or use drugs.   ROS:  Unless specified above, all other ROS negative.   PHYSICAL EXAM: VS:  BP (!) 178/118 (BP Location: Left Arm, Patient Position: Sitting, Cuff Size: Normal)   Pulse 72   Ht 5\' 5"  (1.651 m)   Wt 143 lb 6.4 oz (65 kg)   SpO2 96%   BMI 23.86 kg/m  GEN: Thin, well developed, in no acute distress  HEENT: normal  Neck: no JVD, carotid bruits, or masses Cardiac: Tachy Reg ; no murmurs, rubs, or gallops,no edema  Respiratory:  clear to auscultation bilaterally, normal work of breathing GI: soft, nontender, nondistended, + BS MS: no deformity or atrophy  Skin: warm and dry, no rash Neuro:  Alert and Oriented x 3, Strength and sensation are intact Psych: euthymic mood, full affect   EKG:  EKG ordered today 01/13/18 shows sinus tachycardia 116 with occasional PAC, left anterior fascicular block, LVH.  Personally reviewed.  04/16/17 shows sinus tachycardia rate 104 with left anterior fascicular block, nonspecific ST-T wave changes. Personally viewed 04/01/16-sinus rhythm, 88, left anterior fascicular block, borderline LVH, nonspecific ST-T wave changes personally viewed-no significant change from prior-12/02/13-will rhythm, sinus arrhythmia, left axis deviation/left anterior fascicular block, LVH, nonspecific ST changes, heart rate 73  ECHO 04/2017: - Left ventricle: The cavity size was normal. Systolic function was   normal. The estimated ejection fraction was in the range of 55%   to 60%. Wall motion was normal; there were no regional wall   motion abnormalities. Doppler parameters are consistent with   abnormal left ventricular relaxation (grade 1 diastolic   dysfunction).    ECHO 2012:   1. Normal LV size and function. 2. There were no regional wall motion  abnormalities. 3. Left ventricular ejection fraction estimated by 2D at 60-65 percent. 4. Mild mitral valve regurgitation. 5. Trivial tricuspid regurgitation.  Labs: Albumin 3.4, hemoglobin 12.5, creatinine 1.0  CT scan on 12/26/16-no recurrence of lung cancer. Aortic atherosclerosis and coronary artery calcification noted. Pulmonary fibrotic changes noted.   ASSESSMENT AND PLAN:   History of dilated cardiomyopathy  - Ejection fraction previously 15%, now normal at 65%. Excellent.  Restarting bisoprolol because of tachycardia.  Non-small cell cancer of right lung, stage IA  - Radiation only   - Dr. Lovenia Kim signs of disease recurrence. Reviewed his office note from 01/09/17.  Pulmonary fibrosis   -  Dr. Melvyn Hunter  Bisoprolol restarted. Improved.   Mitral regurgitation   - After cardiomyopathy resolved, mitral regurgitation also now mild.   Left anterior fascicular block   - Conduction disorder. No changes   Essential hypertension  - When she saw Truitt Merle last on 11/13/16 her blood pressure was severely elevated. However shortly after that visit, her blood pressures returned to normal. Excellent. I reviewed readings.  Once again today, her blood pressure is quite elevated.  She does not believe that she has been taking her bisoprolol or amlodipine.  We will restart these medications.  Aortic atherosclerosis  - Continue with secondary prevention.  3 month follow up. (Daughter requests).   Signed, Candee Furbish, MD [facility]  01/13/2018 5:12 PM

## 2018-01-13 NOTE — Patient Instructions (Addendum)
Medication Instructions:  Please restart Amlodipine and Bisoprolol as ordered. Continue all other medications as listed.  Follow-Up: Follow up in 3 months with Dr. Marlou Porch.   If you need a refill on your cardiac medications before your next appointment, please call your pharmacy.  Thank you for choosing [facility]!!

## 2018-02-18 ENCOUNTER — Telehealth: Payer: Self-pay

## 2018-02-18 NOTE — Telephone Encounter (Signed)
Will have Dr Marlou Porch to review for orders.

## 2018-02-18 NOTE — Telephone Encounter (Signed)
Pt daughter Larena Glassman called wanting a new medcation because the bisoprolol is back ordered.

## 2018-02-21 NOTE — Telephone Encounter (Signed)
Toprol 50mg  po QD Candee Furbish, MD

## 2018-02-23 MED ORDER — METOPROLOL SUCCINATE ER 50 MG PO TB24: 50.0000 mg | ORAL_TABLET | Freq: Every day | ORAL | 3 refills | Status: DC

## 2018-02-23 NOTE — Addendum Note (Signed)
Addended by: Shellia Cleverly on: 02/23/2018 08:58 AM   Modules accepted: Orders

## 2018-02-23 NOTE — Telephone Encounter (Signed)
Left message for daughter [name] that Bisoprolol is being d/ced and RX for Metoprolol succinate 50 mg daily has been sent into pt's pharmacy.  Requested she c/b if further questions/concerns.

## 2018-04-07 ENCOUNTER — Encounter: Payer: Self-pay | Admitting: Cardiology

## 2018-04-21 ENCOUNTER — Encounter: Payer: Self-pay | Admitting: Cardiology

## 2018-04-21 ENCOUNTER — Ambulatory Visit: Payer: Medicare Other | Admitting: Cardiology

## 2018-04-21 VITALS — BP 128/76 | HR 122 | Ht 65.0 in | Wt 141.2 lb

## 2018-04-21 DIAGNOSIS — I2510 Atherosclerotic heart disease of native coronary artery without angina pectoris: Secondary | ICD-10-CM | POA: Diagnosis not present

## 2018-04-21 DIAGNOSIS — I2583 Coronary atherosclerosis due to lipid rich plaque: Secondary | ICD-10-CM

## 2018-04-21 DIAGNOSIS — I340 Nonrheumatic mitral (valve) insufficiency: Secondary | ICD-10-CM | POA: Diagnosis not present

## 2018-04-21 DIAGNOSIS — I700 Atherosclerosis of aorta: Secondary | ICD-10-CM | POA: Diagnosis not present

## 2018-04-21 DIAGNOSIS — I10 Essential (primary) hypertension: Secondary | ICD-10-CM | POA: Diagnosis not present

## 2018-04-21 MED ORDER — METOPROLOL SUCCINATE ER 100 MG PO TB24: 100.0000 mg | ORAL_TABLET | Freq: Every day | ORAL | 0 refills | Status: DC

## 2018-04-21 NOTE — Patient Instructions (Addendum)
Medication Instructions:  Your physician has recommended you make the following change in your medication:  INCREASE: Toprol to 100 mg one time a day   If you need a refill on your cardiac medications, please contact your pharmacy first.  Labwork: None ordered   Testing/Procedures: None ordered   Follow-Up: Your physician recommends that you schedule a follow-up appointment in: 3 months with Dr. Marlou Porch  Any Other Special Instructions Will Be Listed Below (If Applicable).   Thank you for choosing [facility]    [phone]  If you need a refill on your cardiac medications before your next appointment, please call your pharmacy.

## 2018-04-21 NOTE — Progress Notes (Signed)
[facility]. [address], [place]  [address] Phone: [phone] Fax:  [phone]  Date:  04/21/2018   ID:  Joan Hunter, DOB 06/11/1927, MRN [id number]  PCP:  [name], MD   History of Present Illness: Joan Hunter is a 84 y.o. female with moderate coronary artery disease prior catheterization in 2009, prior idiopathic cardiomyopathy (EF was 15%) now resolved 55%, mitral regurgitation mild here for followup.  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 Dr. Tammi Klippel. Radiation only.   Dr. Melvyn Novas changed Coreg to Bystolic to bisoprolol, but she is now back on Toprol. COPD/bronchitis. Treating hiatal hernia with Protonix as well.   Continuing with atorvastatin, hyperlipidemia.  She experienced some dizziness when standing and her medications were previously reduced. This has improved.  Her daughter, [name], helps her out significantly but she also make sure that she gets up and moves around adequately.   I encouraged her to use her walker. She has a split-level house. Concerned about stair use.   04/16/17-she has been having some more deconditioning, or shortness of breath with activity. Her daughter still has to motivate her to get up she states. She denies any chest pain. No orthopnea. No edema. No increase in weight.   01/13/17-mild shortness of breath with activity, no significant change.  She is trying to maintain her weight.  Mild shortness of breath with activity, she is trying to maintain her weight.  No significant change.  Her heart rate seems to be increased today.  Her daughter does not think that she has been taking her bisoprolol or amlodipine.  Her blood pressure is quite elevated as well. Heart rate 116 sinus tach  04/21/18 -still having some sinus tachycardia despite metoprolol 50.  We are increasing to 100.  She has had some wheezing when walking.  Mild cough.   Has not seen Dr. Melvyn Novas in quite some time.  She is seeing Dr. [name].  No syncope, no orthopnea, no PND, no bleeding.   Wt Readings from Last 3 Encounters:  04/21/18 141 lb 3.2 oz (64 kg)  01/13/18 143 lb 6.4 oz (65 kg)  01/01/18 137 lb 9.6 oz (62.4 kg)     Past Medical History:  Diagnosis Date  . Allergic rhinitis   . Arthritis   . CAD (coronary artery disease)    mod 2V CAD (60% LAD, 70-80% mid CX) by 01/2008 cath  . CHF (congestive heart failure) ([place])   . Diabetes mellitus    dx 10 plus yrs ago  . Dilated cardiomyopathy ([place])    now resolved, EF 60-65% echo 10/11/11 (Dr. Marlou Porch)  . Diverticulosis   . Hyperlipidemia   . Hypertension   . Hypokalemia 01/09/2017  . Lung mass   . MR (mitral regurgitation)   . Osteopenia     Past Surgical History:  Procedure Laterality Date  . ABDOMINAL HYSTERECTOMY    . APPENDECTOMY    . EYE SURGERY     bil cataracts  . KNEE ARTHROSCOPY     ?  left  knee  . TONSILLECTOMY      Current Outpatient Medications  Medication Sig Dispense Refill  . amLODipine (NORVASC) 5 MG tablet Take 1 tablet (5 mg total) by mouth at bedtime. 30 tablet 6  . aspirin 81 MG tablet Take 81 mg by mouth daily.    Marland Kitchen atorvastatin (LIPITOR) 40 MG tablet Take 40 mg  by mouth daily at 6 PM.     . divalproex (DEPAKOTE ER) 250 MG 24 hr tablet Take 1 tablet at night (Patient taking differently: Take 250 mg by mouth daily. ) 90 tablet 3  . donepezil (ARICEPT) 10 MG tablet Take 1 tablet daily (Patient taking differently: Take 10 mg by mouth at bedtime. ) 90 tablet 3  . furosemide (LASIX) 40 MG tablet Take 1 tablet (40 mg total) by mouth every other day. 45 tablet 2  . glipiZIDE (GLUCOTROL) 5 MG tablet Take 2.5 mg by mouth daily.    . pantoprazole (PROTONIX) 40 MG tablet Take 40 mg by mouth daily.     . potassium chloride SA (K-DUR,KLOR-CON) 20 MEQ tablet Take 2 tablets (40 mEq total) by mouth daily. 20 tablet 0  . PROAIR HFA 108 (90 Base) MCG/ACT inhaler Inhale 2 puffs into  the lungs every 6 (six) hours as needed for wheezing or shortness of breath.  6  . metoprolol succinate (TOPROL-XL) 100 MG 24 hr tablet Take 1 tablet (100 mg total) by mouth daily. Take with or immediately following a meal. 90 tablet 0   No current facility-administered medications for this visit.     Allergies:    Allergies  Allergen Reactions  . Moxifloxacin     REACTION: hives  . Penicillins     REACTION: hives  . Prednisone     REACTION: hives    Social History:  The patient  reports that she quit smoking about 45 years ago. Her smoking use included cigarettes. She smoked 0.50 packs per day. She has never used smokeless tobacco. She reports that she does not drink alcohol or use drugs.   ROS:  Unless specified above, all other ROS negative.   PHYSICAL EXAM: VS:  BP 128/76   Pulse (!) 122   Ht 5\' 5"  (1.651 m)   Wt 141 lb 3.2 oz (64 kg)   SpO2 96%   BMI 23.50 kg/m  GEN: Well nourished, well developed, in no acute distress, thin, using walker  HEENT: normal  Neck: no JVD, carotid bruits, or masses Cardiac: Tachy reg; no murmurs, rubs, or gallops,no edema  Respiratory:  clear to auscultation bilaterally, normal work of breathing GI: soft, nontender, nondistended, + BS MS: no deformity or atrophy  Skin: warm and dry, no rash Neuro:  Alert and Oriented x 3, Strength and sensation are intact Psych: euthymic mood, full affect    EKG:  EKG ordered today 01/13/18 shows sinus tachycardia 116 with occasional PAC, left anterior fascicular block, LVH.  Personally reviewed.  04/16/17 shows sinus tachycardia rate 104 with left anterior fascicular block, nonspecific ST-T wave changes. Personally viewed 04/01/16-sinus rhythm, 88, left anterior fascicular block, borderline LVH, nonspecific ST-T wave changes personally viewed-no significant change from prior-12/02/13-will rhythm, sinus arrhythmia, left axis deviation/left anterior fascicular block, LVH, nonspecific ST changes, heart rate  73  ECHO 04/2017: - Left ventricle: The cavity size was normal. Systolic function was   normal. The estimated ejection fraction was in the range of 55%   to 60%. Wall motion was normal; there were no regional wall   motion abnormalities. Doppler parameters are consistent with   abnormal left ventricular relaxation (grade 1 diastolic   dysfunction).    ECHO 2012:   1. Normal LV size and function. 2. There were no regional wall motion abnormalities. 3. Left ventricular ejection fraction estimated by 2D at 60-65 percent. 4. Mild mitral valve regurgitation. 5. Trivial tricuspid regurgitation.  Labs: Albumin 3.4,  hemoglobin 12.5, creatinine 1.0  CT scan on 12/26/16-no recurrence of lung cancer. Aortic atherosclerosis and coronary artery calcification noted. Pulmonary fibrotic changes noted.   ASSESSMENT AND PLAN:   History of dilated cardiomyopathy  - Ejection fraction previously 15%, now normal at 65%. Excellent.  Back on Toprol because of tachycardia.  Increasing dose to 100.  Non-small cell cancer of right lung, stage IA  - Radiation only   - Dr. Lovenia Kim signs of disease recurrence. Reviewed his office note from 01/09/18.  Pulmonary fibrosis   -  Dr. Melvyn Novas  Bisoprolol restarted. Improved.  She has not seen him in quite some time.  Mitral regurgitation   - After cardiomyopathy resolved, mitral regurgitation also now mild.  Doing well, functional MR previously  Left anterior fascicular block   - Conduction disorder. No changes, no syncope  Essential hypertension  -She is now back on Toprol XL 50 but her sinus tachycardia remains.  I will increase her metoprolol to 100 mg a day.  Blood pressures have been somewhat labile in the past.  Bisoprolol was discontinued.  Aortic atherosclerosis  - Continue with secondary prevention.  No changes  3 month follow up. (Daughter requests).   Signed, Candee Furbish, MD [facility]  04/21/2018 10:04 AM

## 2018-06-02 ENCOUNTER — Ambulatory Visit: Payer: Medicare Other | Admitting: Neurology

## 2018-06-02 ENCOUNTER — Other Ambulatory Visit: Payer: Self-pay

## 2018-06-02 ENCOUNTER — Encounter: Payer: Self-pay | Admitting: Neurology

## 2018-06-02 VITALS — BP 124/62 | HR 117

## 2018-06-02 DIAGNOSIS — F0391 Unspecified dementia with behavioral disturbance: Secondary | ICD-10-CM | POA: Diagnosis not present

## 2018-06-02 DIAGNOSIS — F03B18 Unspecified dementia, moderate, with other behavioral disturbance: Secondary | ICD-10-CM

## 2018-06-02 NOTE — Patient Instructions (Signed)
1. Continue current medications 2. Follow-up in 6 months, call for any changes  FALL PRECAUTIONS: Be cautious when walking. Scan the area for obstacles that may increase the risk of trips and falls. When getting up in the mornings, sit up at the edge of the bed for a few minutes before getting out of bed. Consider elevating the bed at the head end to avoid drop of blood pressure when getting up. Walk always in a well-lit room (use night lights in the walls). Avoid area rugs or power cords from appliances in the middle of the walkways. Use a walker or a cane if necessary and consider physical therapy for balance exercise. Get your eyesight checked regularly.  FINANCIAL OVERSIGHT: Supervision, especially oversight when making financial decisions or transactions is also recommended.  HOME SAFETY: Consider the safety of the kitchen when operating appliances like stoves, microwave oven, and blender. Consider having supervision and share cooking responsibilities until no longer able to participate in those. Accidents with firearms and other hazards in the house should be identified and addressed as well.  ABILITY TO BE LEFT ALONE: If patient is unable to contact 911 operator, consider using LifeLine, or when the need is there, arrange for someone to stay with patients. Smoking is a fire hazard, consider supervision or cessation. Risk of wandering should be assessed by caregiver and if detected at any point, supervision and safe proof recommendations should be instituted.  MEDICATION SUPERVISION: Inability to self-administer medication needs to be constantly addressed. Implement a mechanism to ensure safe administration of the medications.  RECOMMENDATIONS FOR ALL PATIENTS WITH MEMORY PROBLEMS: 1. Continue to exercise (Recommend 30 minutes of walking everyday, or 3 hours every week) 2. Increase social interactions - continue going to [place] and enjoy social gatherings with friends and family 3. Eat healthy,  avoid fried foods and eat more fruits and vegetables 4. Maintain adequate blood pressure, blood sugar, and blood cholesterol level. Reducing the risk of stroke and cardiovascular disease also helps promoting better memory. 5. Avoid stressful situations. Live a simple life and avoid aggravations. Organize your time and prepare for the next day in anticipation. 6. Sleep well, avoid any interruptions of sleep and avoid any distractions in the bedroom that may interfere with adequate sleep quality 7. Avoid sugar, avoid sweets as there is a strong link between excessive sugar intake, diabetes, and cognitive impairment The Mediterranean diet has been shown to help patients reduce the risk of progressive memory disorders and reduces cardiovascular risk. This includes eating fish, eat fruits and green leafy vegetables, nuts like almonds and hazelnuts, walnuts, and also use olive oil. Avoid fast foods and fried foods as much as possible. Avoid sweets and sugar as sugar use has been linked to worsening of memory function.  There is always a concern of gradual progression of memory problems. If this is the case, then we may need to adjust level of care according to patient needs. Support, both to the patient and caregiver, should then be put into place.

## 2018-06-02 NOTE — Progress Notes (Signed)
NEUROLOGY FOLLOW UP OFFICE NOTE  Joan Hunter [id number] 08-Mar-1927  HISTORY OF PRESENT ILLNESS: I had the pleasure of seeing Joan Hunter in follow-up in the neurology clinic on 06/09/2018. The patient was last seen 6 months ago for moderate dementia, likely Lewy Body dementia with significant hallucinations. She is again accompanied by her daughter who helps provides the history today. MRI brain showed moderate to severe chronic microvascular disease. She had side effects on higher dose of Depakote ("she was talking real crazy"), and has been tolerating Depakote 250mg  qhs better. Her daughter reports that she is not reporting any visual hallucinations currently. She continues to be forgetful, but no significant change since last visit. No wandering behavior. She is able to dress herself. Her daughter helps with baths. No side effects on Donepezil 10mg  daily. She denies any headaches, dizziness, focal numbness/tingling/weakness, no falls.   HPI 12/06/2016: This is an 84 yo RH woman with a history of diabetes, hypertension, CAD, lung cancer, with worsening memory and hallucinations. When asked about her memory, she states there are some things she remembers and some she does not. She lives with her daughter and states that she cooks (she does not). She denies misplacing things but daughter shakes her head. Her daughter administers medications, otherwise she would not take them. Her daughter states that memory changes started only around 2 months ago, prior to this it was "pretty good." She started having difficulties remembering the days of the week, asking her grandson why he was not in school, then a few minutes later asking him the same question. She used to fix her own medications, but over the past month, her daughter has to physically watch her take them. She used to live by herself but after 2 falls, her daughter has lived with her for the past 2 years. Daughter has always been in charge  of bill payments. She stopped driving 5 years ago due to bad knees. She denied getting lost driving, her daughter states she can still give directions when someone else is driving.  Her daughter's main concern are the hallucinations. The patient thinks these are real and denies having any hallucinations. Her daughter cites multiple instances, these started in June but have been worsening. She would talk about people across the street or someone sitting outside, a dog across the street in the car. She told her daughter she is in trouble with the people she works with, that they were downstairs. She has seen a cat on the bed, or something under the bed, something eating her shoes. She would ask her daughter if the man downstairs was gone. She said there was a green car in the driveway, which was actually a large tarp. She saw a little boy sitting up on the tree outside. She called her daughter "Momma" this morning. She denies any auditory hallucinations. Her daughter denies any personality changes, no paranoia associated with these hallucinations. The patient denies any headaches, dizziness, diplopia, dysarthria, dysphagia, neck/back pain, focal numbness/tingling/weakness, bowel/bladder dysfunction. No anosmia, tremors, no falls. They deny any significant head injuries. No family history of dementia.  PAST MEDICAL HISTORY: Past Medical History:  Diagnosis Date  . Allergic rhinitis   . Arthritis   . CAD (coronary artery disease)    mod 2V CAD (60% LAD, 70-80% mid CX) by 01/2008 cath  . CHF (congestive heart failure) ([place])   . Diabetes mellitus    dx 10 plus yrs ago  . Dilated cardiomyopathy ([place])    now  resolved, EF 60-65% echo 10/11/11 (Dr. Marlou Porch)  . Diverticulosis   . Hyperlipidemia   . Hypertension   . Hypokalemia 01/09/2017  . Lung mass   . MR (mitral regurgitation)   . Osteopenia     MEDICATIONS: Current Outpatient Medications on File Prior to Visit  Medication Sig Dispense Refill  .  amLODipine (NORVASC) 5 MG tablet Take 1 tablet (5 mg total) by mouth at bedtime. 30 tablet 6  . aspirin 81 MG tablet Take 81 mg by mouth daily.    Marland Kitchen atorvastatin (LIPITOR) 40 MG tablet Take 40 mg by mouth daily at 6 PM.     . divalproex (DEPAKOTE ER) 250 MG 24 hr tablet Take 1 tablet at night (Patient taking differently: Take 250 mg by mouth daily. ) 90 tablet 3  . donepezil (ARICEPT) 10 MG tablet Take 1 tablet daily (Patient taking differently: Take 10 mg by mouth at bedtime. ) 90 tablet 3  . furosemide (LASIX) 40 MG tablet Take 1 tablet (40 mg total) by mouth every other day. 45 tablet 2  . glipiZIDE (GLUCOTROL) 5 MG tablet Take 2.5 mg by mouth daily.    . metoprolol succinate (TOPROL-XL) 100 MG 24 hr tablet Take 1 tablet (100 mg total) by mouth daily. Take with or immediately following a meal. 90 tablet 0  . pantoprazole (PROTONIX) 40 MG tablet Take 40 mg by mouth daily.     . potassium chloride SA (K-DUR,KLOR-CON) 20 MEQ tablet Take 2 tablets (40 mEq total) by mouth daily. 20 tablet 0  . PROAIR HFA 108 (90 Base) MCG/ACT inhaler Inhale 2 puffs into the lungs every 6 (six) hours as needed for wheezing or shortness of breath.  6   No current facility-administered medications on file prior to visit.     ALLERGIES: Allergies  Allergen Reactions  . Moxifloxacin     REACTION: hives  . Penicillins     REACTION: hives  . Prednisone     REACTION: hives    FAMILY HISTORY: Family History  Problem Relation Age of Onset  . Colon cancer Mother   . Heart disease Brother     SOCIAL HISTORY: Social History   Socioeconomic History  . Marital status: Widowed    Spouse name: Not on file  . Number of children: Not on file  . Years of education: Not on file  . Highest education level: Not on file  Occupational History  . Not on file  Social Needs  . Financial resource strain: Not on file  . Food insecurity:    Worry: Not on file    Inability: Not on file  . Transportation needs:     Medical: Not on file    Non-medical: Not on file  Tobacco Use  . Smoking status: Former Smoker    Packs/day: 0.50    Types: Cigarettes    Last attempt to quit: 06/15/1972    Years since quitting: 45.9  . Smokeless tobacco: Never Used  Substance and Sexual Activity  . Alcohol use: No  . Drug use: No  . Sexual activity: Not on file  Lifestyle  . Physical activity:    Days per week: Not on file    Minutes per session: Not on file  . Stress: Not on file  Relationships  . Social connections:    Talks on phone: Not on file    Gets together: Not on file    Attends religious service: Not on file    Active member of club  or organization: Not on file    Attends meetings of clubs or organizations: Not on file    Relationship status: Not on file  . Intimate partner violence:    Fear of current or ex partner: Not on file    Emotionally abused: Not on file    Physically abused: Not on file    Forced sexual activity: Not on file  Other Topics Concern  . Not on file  Social History Narrative  . Not on file    REVIEW OF SYSTEMS: Constitutional: No fevers, chills, or sweats, no generalized fatigue, change in appetite Eyes: No visual changes, double vision, eye pain Ear, nose and throat: No hearing loss, ear pain, nasal congestion, sore throat Cardiovascular: No chest pain, palpitations Respiratory:  No shortness of breath at rest or with exertion, wheezes GastrointestinaI: No nausea, vomiting, diarrhea, abdominal pain, fecal incontinence Genitourinary:  No dysuria, urinary retention or frequency Musculoskeletal:  No neck pain, back pain Integumentary: No rash, pruritus, skin lesions Neurological: as above Psychiatric: No depression, insomnia, anxiety Endocrine: No palpitations, fatigue, diaphoresis, mood swings, change in appetite, change in weight, increased thirst Hematologic/Lymphatic:  No anemia, purpura, petechiae. Allergic/Immunologic: no itchy/runny eyes, nasal congestion,  recent allergic reactions, rashes  PHYSICAL EXAM: Vitals:   06/02/18 1022  BP: 124/62  Pulse: (!) 117  SpO2: 98%   General: No acute distress, she is not very cooperative in the office today Head:  Normocephalic/atraumatic Neck: supple, no paraspinal tenderness, full range of motion Heart:  Regular rate and rhythm Lungs:  Clear to auscultation bilaterally Back: No paraspinal tenderness Skin/Extremities: No rash, no edema Neurological Exam: alert and oriented to person, place. No aphasia or dysarthria. Fund of knowledge is reduced. Recent and remote memory are impaired. 0/3 delayed recall.Attention and concentration are normal, refused to spell WORLD backward. Able to name objects and repeat phrases. CDT 4/5 MMSE - Mini Mental State Exam 06/02/2018 06/08/2017 12/06/2016  Not completed: - Unable to complete -  Orientation to time 0 - 2  Orientation to Place 3 - 4  Registration 3 - 3  Attention/ Calculation 0 - 1  Recall 0 - 0  Language- name 2 objects 2 - 2  Language- repeat 1 - 1  Language- follow 3 step command 3 - 2  Language- read & follow direction 1 - 1  Write a sentence 1 - 1  Copy design 0 - 0  Total score 14 - 17    Cranial nerves: Pupils equal, round, reactive to light.  Extraocular movements intact with no nystagmus. Visual fields full. Facial sensation intact. No facial asymmetry. Tongue, uvula, palate midline.  Motor: Cogwheeling bilaterally, muscle strength 5/5 throughout with no pronator drift.  Sensation to light touch intact.  No extinction to double simultaneous stimulation.  Deep tendon reflexes +1 throughout, toes downgoing.  Finger to nose testing intact.  Gait narrow-based with walker, no ataxia.  IMPRESSION: This is an 84 yo RH woman with a history of diabetes, hypertension, CAD, lung cancer, and moderate dementia with behavioral disturbance. Her daughter reports symptoms started with hallucinations, suggestive of Lewy body dementia. MRI brain did not show any  acute changes, there was moderate to severe chronic microvascular disease. MMSE today 14/30. She is taking Donepezil 10mg  daily. She is also on low dose Depakote ER 250mg  qhs, higher doses caused side effects. Hallucinations have quieted down, continue current medications. Continue 24/7 care. She does not drive. She will follow-up in 6 months and knows to call for any changes  Thank you for allowing me to participate in her care.  Please do not hesitate to call for any questions or concerns.  The duration of this appointment visit was 30 minutes of face-to-face time with the patient.  Greater than 50% of this time was spent in counseling, explanation of diagnosis, planning of further management, and coordination of care.   Ellouise Newer, M.D.   CC: Dr. [name]

## 2018-06-30 ENCOUNTER — Inpatient Hospital Stay: Payer: Medicare Other | Attending: Internal Medicine

## 2018-06-30 ENCOUNTER — Ambulatory Visit (HOSPITAL_COMMUNITY)
Admission: RE | Admit: 2018-06-30 | Discharge: 2018-06-30 | Disposition: A | Discharge status: Home / Self Care | Payer: Medicare Other | Source: Ambulatory Visit | Attending: Internal Medicine | Admitting: Internal Medicine

## 2018-06-30 DIAGNOSIS — I2510 Atherosclerotic heart disease of native coronary artery without angina pectoris: Secondary | ICD-10-CM | POA: Insufficient documentation

## 2018-06-30 DIAGNOSIS — I700 Atherosclerosis of aorta: Secondary | ICD-10-CM | POA: Insufficient documentation

## 2018-06-30 DIAGNOSIS — C3490 Malignant neoplasm of unspecified part of unspecified bronchus or lung: Secondary | ICD-10-CM | POA: Diagnosis present

## 2018-06-30 DIAGNOSIS — I10 Essential (primary) hypertension: Secondary | ICD-10-CM | POA: Diagnosis not present

## 2018-06-30 DIAGNOSIS — M8580 Other specified disorders of bone density and structure, unspecified site: Secondary | ICD-10-CM | POA: Diagnosis not present

## 2018-06-30 DIAGNOSIS — R590 Localized enlarged lymph nodes: Secondary | ICD-10-CM | POA: Diagnosis not present

## 2018-06-30 DIAGNOSIS — R918 Other nonspecific abnormal finding of lung field: Secondary | ICD-10-CM | POA: Diagnosis not present

## 2018-06-30 DIAGNOSIS — E119 Type 2 diabetes mellitus without complications: Secondary | ICD-10-CM | POA: Insufficient documentation

## 2018-06-30 DIAGNOSIS — K449 Diaphragmatic hernia without obstruction or gangrene: Secondary | ICD-10-CM | POA: Diagnosis not present

## 2018-06-30 DIAGNOSIS — Z79899 Other long term (current) drug therapy: Secondary | ICD-10-CM | POA: Insufficient documentation

## 2018-06-30 DIAGNOSIS — E039 Hypothyroidism, unspecified: Secondary | ICD-10-CM | POA: Insufficient documentation

## 2018-06-30 DIAGNOSIS — M129 Arthropathy, unspecified: Secondary | ICD-10-CM | POA: Diagnosis not present

## 2018-06-30 DIAGNOSIS — C3431 Malignant neoplasm of lower lobe, right bronchus or lung: Secondary | ICD-10-CM | POA: Insufficient documentation

## 2018-06-30 DIAGNOSIS — I420 Dilated cardiomyopathy: Secondary | ICD-10-CM | POA: Diagnosis not present

## 2018-06-30 DIAGNOSIS — E785 Hyperlipidemia, unspecified: Secondary | ICD-10-CM | POA: Insufficient documentation

## 2018-06-30 DIAGNOSIS — I509 Heart failure, unspecified: Secondary | ICD-10-CM | POA: Insufficient documentation

## 2018-06-30 DIAGNOSIS — Z7982 Long term (current) use of aspirin: Secondary | ICD-10-CM | POA: Diagnosis not present

## 2018-06-30 LAB — CMP (CANCER CENTER ONLY)
ALT: 12 U/L (ref 0–44)
ANION GAP: 11 (ref 5–15)
AST: 18 U/L (ref 15–41)
Albumin: 3.6 g/dL (ref 3.5–5.0)
Alkaline Phosphatase: 92 U/L (ref 38–126)
BILIRUBIN TOTAL: 0.4 mg/dL (ref 0.3–1.2)
BUN: 29 mg/dL — ABNORMAL HIGH (ref 8–23)
CHLORIDE: 104 mmol/L (ref 98–111)
CO2: 29 mmol/L (ref 22–32)
Calcium: 9.3 mg/dL (ref 8.9–10.3)
Creatinine: 1.24 mg/dL — ABNORMAL HIGH (ref 0.44–1.00)
GFR, EST AFRICAN AMERICAN: 43 mL/min — AB (ref >60)
GFR, Estimated: 37 mL/min — ABNORMAL LOW (ref >60)
Glucose, Bld: 153 mg/dL — ABNORMAL HIGH (ref 70–99)
POTASSIUM: 3.7 mmol/L (ref 3.5–5.1)
Sodium: 144 mmol/L (ref 135–145)
TOTAL PROTEIN: 7.9 g/dL (ref 6.5–8.1)

## 2018-06-30 LAB — CBC WITH DIFFERENTIAL (CANCER CENTER ONLY)
BASOS ABS: 0.1 10*3/uL (ref 0.0–0.1)
Basophils Relative: 1 %
EOS PCT: 1 %
Eosinophils Absolute: 0.1 10*3/uL (ref 0.0–0.5)
HEMATOCRIT: 38.1 % (ref 34.8–46.6)
Hemoglobin: 12.6 g/dL (ref 11.6–15.9)
LYMPHS ABS: 2 10*3/uL (ref 0.9–3.3)
LYMPHS PCT: 26 %
MCH: 29.1 pg (ref 25.1–34.0)
MCHC: 33.1 g/dL (ref 31.5–36.0)
MCV: 87.9 fL (ref 79.5–101.0)
MONO ABS: 0.8 10*3/uL (ref 0.1–0.9)
MONOS PCT: 10 %
Neutro Abs: 4.8 10*3/uL (ref 1.5–6.5)
Neutrophils Relative %: 62 %
PLATELETS: 198 10*3/uL (ref 145–400)
RBC: 4.33 MIL/uL (ref 3.70–5.45)
RDW: 15.9 % — AB (ref 11.2–14.5)
WBC Count: 7.7 10*3/uL (ref 3.9–10.3)

## 2018-07-02 ENCOUNTER — Telehealth: Payer: Self-pay | Admitting: Internal Medicine

## 2018-07-02 ENCOUNTER — Encounter: Payer: Self-pay | Admitting: Internal Medicine

## 2018-07-02 ENCOUNTER — Inpatient Hospital Stay: Payer: Medicare Other | Admitting: Internal Medicine

## 2018-07-02 VITALS — BP 151/77 | HR 131 | Temp 98.0°F | Resp 22 | Ht 65.0 in | Wt 140.9 lb

## 2018-07-02 DIAGNOSIS — R590 Localized enlarged lymph nodes: Secondary | ICD-10-CM

## 2018-07-02 DIAGNOSIS — M129 Arthropathy, unspecified: Secondary | ICD-10-CM

## 2018-07-02 DIAGNOSIS — I10 Essential (primary) hypertension: Secondary | ICD-10-CM

## 2018-07-02 DIAGNOSIS — I420 Dilated cardiomyopathy: Secondary | ICD-10-CM

## 2018-07-02 DIAGNOSIS — I700 Atherosclerosis of aorta: Secondary | ICD-10-CM

## 2018-07-02 DIAGNOSIS — E119 Type 2 diabetes mellitus without complications: Secondary | ICD-10-CM

## 2018-07-02 DIAGNOSIS — E785 Hyperlipidemia, unspecified: Secondary | ICD-10-CM

## 2018-07-02 DIAGNOSIS — C3431 Malignant neoplasm of lower lobe, right bronchus or lung: Secondary | ICD-10-CM | POA: Diagnosis not present

## 2018-07-02 DIAGNOSIS — I509 Heart failure, unspecified: Secondary | ICD-10-CM

## 2018-07-02 DIAGNOSIS — I2510 Atherosclerotic heart disease of native coronary artery without angina pectoris: Secondary | ICD-10-CM

## 2018-07-02 DIAGNOSIS — K449 Diaphragmatic hernia without obstruction or gangrene: Secondary | ICD-10-CM

## 2018-07-02 DIAGNOSIS — C3490 Malignant neoplasm of unspecified part of unspecified bronchus or lung: Secondary | ICD-10-CM

## 2018-07-02 DIAGNOSIS — E039 Hypothyroidism, unspecified: Secondary | ICD-10-CM

## 2018-07-02 DIAGNOSIS — M8580 Other specified disorders of bone density and structure, unspecified site: Secondary | ICD-10-CM

## 2018-07-02 DIAGNOSIS — C3491 Malignant neoplasm of unspecified part of right bronchus or lung: Secondary | ICD-10-CM

## 2018-07-02 DIAGNOSIS — Z7982 Long term (current) use of aspirin: Secondary | ICD-10-CM

## 2018-07-02 DIAGNOSIS — Z79899 Other long term (current) drug therapy: Secondary | ICD-10-CM

## 2018-07-02 NOTE — Progress Notes (Signed)
[place] [phone]   [phone]  OFFICE PROGRESS NOTE  [name], MD [address] [place] [address]  DIAGNOSIS: Stage IA (T1b, N0, M0) non-small cell lung cancer consistent with adenocarcinoma involving the right lower lobe, diagnosed in September 2014.  PRIOR THERAPY: Status post curative stereotactic radiotherapy to the right lower lobe lung nodule under the care of Dr. Tammi Klippel completed on 10/29/2013.  CURRENT THERAPY: Observation.  INTERVAL HISTORY: Joan Hunter 84 y.o. female returns to the clinic today for six-month follow-up visit accompanied by her daughter.  The patient is feeling fine today with no concerning complaints except for shortness of breath with exertion.  She denied having any chest pain, cough or hemoptysis.  She has no weight loss or night sweats.  She has no nausea, vomiting, diarrhea or constipation.  She denied having any fever or chills.  She is here today for evaluation and repeat CT scan of the chest for restaging of her disease.  MEDICAL HISTORY: Past Medical History:  Diagnosis Date  . Allergic rhinitis   . Arthritis   . CAD (coronary artery disease)    mod 2V CAD (60% LAD, 70-80% mid CX) by 01/2008 cath  . CHF (congestive heart failure) ([place])   . Diabetes mellitus    dx 10 plus yrs ago  . Dilated cardiomyopathy ([place])    now resolved, EF 60-65% echo 10/11/11 (Dr. Marlou Porch)  . Diverticulosis   . Hyperlipidemia   . Hypertension   . Hypokalemia 01/09/2017  . Lung mass   . MR (mitral regurgitation)   . Osteopenia     ALLERGIES:  is allergic to moxifloxacin; penicillins; and prednisone.  MEDICATIONS:  Current Outpatient Medications  Medication Sig Dispense Refill  . amLODipine (NORVASC) 5 MG tablet Take 1 tablet (5 mg total) by mouth at bedtime. 30 tablet 6  . aspirin 81 MG tablet Take 81 mg by mouth daily.    Marland Kitchen atorvastatin (LIPITOR) 40 MG tablet Take 40 mg by mouth daily  at 6 PM.     . divalproex (DEPAKOTE ER) 250 MG 24 hr tablet Take 1 tablet at night (Patient taking differently: Take 250 mg by mouth daily. ) 90 tablet 3  . donepezil (ARICEPT) 10 MG tablet Take 1 tablet daily (Patient taking differently: Take 10 mg by mouth at bedtime. ) 90 tablet 3  . furosemide (LASIX) 40 MG tablet Take 1 tablet (40 mg total) by mouth every other day. 45 tablet 2  . glipiZIDE (GLUCOTROL) 5 MG tablet Take 2.5 mg by mouth daily.    Marland Kitchen levalbuterol (XOPENEX HFA) 45 MCG/ACT inhaler INHALE 2 PUFFS EVERY 4-6 HRS AS NEEDED  3  . metoprolol succinate (TOPROL-XL) 100 MG 24 hr tablet Take 1 tablet (100 mg total) by mouth daily. Take with or immediately following a meal. 90 tablet 0  . mirtazapine (REMERON) 15 MG tablet Take 15 mg by mouth at bedtime.    . pantoprazole (PROTONIX) 40 MG tablet Take 40 mg by mouth daily.     . potassium chloride SA (K-DUR,KLOR-CON) 20 MEQ tablet Take 2 tablets (40 mEq total) by mouth daily. 20 tablet 0  . PROAIR HFA 108 (90 Base) MCG/ACT inhaler Inhale 2 puffs into the lungs every 6 (six) hours as needed for wheezing or shortness of breath.  6   No current facility-administered medications for this visit.     SURGICAL HISTORY:  Past Surgical History:  Procedure Laterality Date  .  ABDOMINAL HYSTERECTOMY    . APPENDECTOMY    . EYE SURGERY     bil cataracts  . KNEE ARTHROSCOPY     ?  left  knee  . TONSILLECTOMY      REVIEW OF SYSTEMS:  A comprehensive review of systems was negative except for: Respiratory: positive for dyspnea on exertion   PHYSICAL EXAMINATION: General appearance: alert, cooperative and no distress Head: Normocephalic, without obvious abnormality, atraumatic Neck: no adenopathy, no JVD, supple, symmetrical, trachea midline and thyroid not enlarged, symmetric, no tenderness/mass/nodules Lymph nodes: Cervical, supraclavicular, and axillary nodes normal. Resp: clear to auscultation bilaterally Back: symmetric, no curvature. ROM  normal. No CVA tenderness. Cardio: regular rate and rhythm, S1, S2 normal, no murmur, click, rub or gallop GI: soft, non-tender; bowel sounds normal; no masses,  no organomegaly Extremities: extremities normal, atraumatic, no cyanosis or edema  ECOG PERFORMANCE STATUS: 1 - Symptomatic but completely ambulatory  Blood pressure (!) 151/77, pulse (!) 131, temperature 98 F (36.7 C), temperature source Oral, resp. rate (!) 22, height 5\' 5"  (1.651 m), weight 140 lb 14.4 oz (63.9 kg), SpO2 97 %.  LABORATORY DATA: Lab Results  Component Value Date   WBC 7.7 06/30/2018   HGB 12.6 06/30/2018   HCT 38.1 06/30/2018   MCV 87.9 06/30/2018   PLT 198 06/30/2018      Chemistry      Component Value Date/Time   NA 144 06/30/2018 1103   NA 144 12/26/2016 1022   K 3.7 06/30/2018 1103   K 2.9 (LL) 12/26/2016 1022   CL 104 06/30/2018 1103   CO2 29 06/30/2018 1103   CO2 29 12/26/2016 1022   BUN 29 (H) 06/30/2018 1103   BUN 16.7 12/26/2016 1022   CREATININE 1.24 (H) 06/30/2018 1103   CREATININE 1.0 12/26/2016 1022      Component Value Date/Time   CALCIUM 9.3 06/30/2018 1103   CALCIUM 9.3 12/26/2016 1022   ALKPHOS 92 06/30/2018 1103   ALKPHOS 105 12/26/2016 1022   AST 18 06/30/2018 1103   AST 16 12/26/2016 1022   ALT 12 06/30/2018 1103   ALT 8 12/26/2016 1022   BILITOT 0.4 06/30/2018 1103   BILITOT 0.84 12/26/2016 1022       RADIOGRAPHIC STUDIES: Ct Chest Wo Contrast  Result Date: 06/30/2018 CLINICAL DATA:  Non-small cell lung cancer of the RIGHT lower lobe. Diagnosed 2014. status post definitive stereotactic radiotherapy. EXAM: CT CHEST WITHOUT CONTRAST TECHNIQUE: Multidetector CT imaging of the chest was performed following the standard protocol without IV contrast. COMPARISON:  12/25/2017 FINDINGS: Cardiovascular: Coronary artery calcification and aortic atherosclerotic calcification. Mediastinum/Nodes: No axillary or supraclavicular adenopathy. Mediastinal adenopathy. Large hiatal  hernia post heart. Lungs/Pleura: Mass posterior to the RIGHT hilar structures measures 3.2 by 3.1 cm compared to 3.7 x 3.2 cm on prior for no change. Mass less well-defined on this noncontrast exam. RIGHT hilar lymph node measures 15 mm (image 66/2) compared to 16 mm. In the RIGHT lower lobe, 12 x 13 mm nodule (image 104/7) compares to 11 x 12 mm on most recent comparison CT. Nodule measured 11 x 11 mm on CT 12/26/2016. But 12 x 12 mm on 06/27/2015 for overall no significant change. Several branching nodules in the medial RIGHT middle lobe are also unchanged. Pleural thickening along the fissures in the RIGHT upper lobe and bronchiectasis is stable. There multiple small subpleural nodules scattered in LEFT and RIGHT and unchanged Upper Abdomen: Limited view of the liver, kidneys, pancreas are unremarkable. Normal adrenal glands.  Musculoskeletal: No aggressive osseous lesion. Stable compression fracture in the midthoracic spine with focal kyphosis IMPRESSION: 1. Stable perihilar mass in the RIGHT lower lobe. 2. Stable enlarged RIGHT hilar lymph node. 3. Multiple scattered bilateral pulmonary nodules unchanged in size. Dominant nodule in the RIGHT lower lobe stable over multiple comparison exams. 4. Large hiatal hernia. 5. Coronary artery calcification and Aortic Atherosclerosis (ICD10-I70.0). Electronically Signed   By: Suzy Bouchard M.D.   On: 06/30/2018 16:35   ASSESSMENT AND PLAN:  This is a very pleasant 84 years old African-American female with stage IA non-small cell lung cancer. She status post curative SBRT to the right lower lobe nodule completed in November 2014. The patient is currently on observation. The patient is doing fine today except for the recent bronchitis and she is currently on treatment with Z-Pak. Her recent CT scan of the chest showed no concerning findings for disease progression except for mildly enlarged right hilar lymph node.  This could be secondary to her recent inflammatory  process but disease recurrence could not be excluded. I recommended for the patient to continue on observation with repeat CT scan of the chest in 6 months for reevaluation of this area. She was also advised to call immediately if she has any concerning symptoms in the interval. The patient voices understanding of current disease status and treatment options and is in agreement with the current care plan. All questions were answered. The patient knows to call the clinic with any problems, questions or concerns. We can certainly see the patient much sooner if necessary. I spent 10 minutes counseling the patient face to face. The total time spent in the appointment was 15 minutes. Disclaimer: This note was dictated with voice recognition software. Similar sounding words can inadvertently be transcribed and may not be corrected upon review.

## 2018-07-02 NOTE — Telephone Encounter (Signed)
Gave patient avs and calendar of upcoming jan appts.

## 2018-07-27 ENCOUNTER — Other Ambulatory Visit: Payer: Self-pay | Admitting: Cardiology

## 2018-07-28 ENCOUNTER — Ambulatory Visit: Payer: Medicare Other | Admitting: Cardiology

## 2018-08-04 ENCOUNTER — Ambulatory Visit: Payer: Medicare Other | Admitting: Cardiology

## 2018-08-04 ENCOUNTER — Encounter: Payer: Self-pay | Admitting: Cardiology

## 2018-08-04 VITALS — BP 110/68 | HR 99 | Ht 65.0 in | Wt 141.8 lb

## 2018-08-04 DIAGNOSIS — I2583 Coronary atherosclerosis due to lipid rich plaque: Secondary | ICD-10-CM

## 2018-08-04 DIAGNOSIS — I10 Essential (primary) hypertension: Secondary | ICD-10-CM | POA: Diagnosis not present

## 2018-08-04 DIAGNOSIS — I2510 Atherosclerotic heart disease of native coronary artery without angina pectoris: Secondary | ICD-10-CM | POA: Diagnosis not present

## 2018-08-04 DIAGNOSIS — I340 Nonrheumatic mitral (valve) insufficiency: Secondary | ICD-10-CM | POA: Diagnosis not present

## 2018-08-04 NOTE — Patient Instructions (Signed)
Medication Instructions:  The current medical regimen is effective;  continue present plan and medications.  Follow-Up: Follow up in 3 months with Dr Marlou Porch.  If you need a refill on your cardiac medications before your next appointment, please call your pharmacy.  Thank you for choosing [facility]!!

## 2018-08-04 NOTE — Progress Notes (Signed)
Joan Hunter. [address] [place], [place]  [address] Phone: [phone] Fax:  [phone]  Date:  08/04/2018   ID:  [name], DOB 1927-03-01, MRN [id number]  PCP:  [name], MD   History of Present Illness: Joan Hunter is a 84 y.o. female with moderate coronary artery disease prior catheterization in 2009, prior idiopathic cardiomyopathy (EF was 15%) now resolved 55%, mitral regurgitation mild here for followup.  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 Dr. Tammi Klippel. Radiation only.   Dr. Melvyn Novas changed Coreg to Bystolic to bisoprolol, but she is now back on Toprol. COPD/bronchitis. Treating hiatal hernia with Protonix as well.   Continuing with atorvastatin, hyperlipidemia.  She experienced some dizziness when standing and her medications were previously reduced. This has improved.  Her daughter, Joan Hunter, helps her out significantly but she also make sure that she gets up and moves around adequately.   I encouraged her to use her walker. She has a split-level house. Concerned about stair use.   04/16/17-she has been having some more deconditioning, or shortness of breath with activity. Her daughter still has to motivate her to get up she states. She denies any chest pain. No orthopnea. No edema. No increase in weight.   01/13/17-mild shortness of breath with activity, no significant change.  She is trying to maintain her weight.  Mild shortness of breath with activity, she is trying to maintain her weight.  No significant change.  Her heart rate seems to be increased today.  Her daughter does not think that she has been taking her bisoprolol or amlodipine.  Her blood pressure is quite elevated as well. Heart rate 116 sinus tach  04/21/18 -still having some sinus tachycardia despite metoprolol 50.  We are increasing to 100.  She has had some wheezing when walking.  Mild cough.   Has not seen Dr. Melvyn Novas in quite some time.  She is seeing Dr. [name].  No syncope, no orthopnea, no PND, no bleeding.  08/04/2018-no chest pain fevers chills nausea vomiting syncope bleeding.  Still encouraging motion, movement.  Feels well.   Wt Readings from Last 3 Encounters:  08/04/18 141 lb 12.8 oz (64.3 kg)  07/02/18 140 lb 14.4 oz (63.9 kg)  04/21/18 141 lb 3.2 oz (64 kg)     Past Medical History:  Diagnosis Date  . Allergic rhinitis   . Arthritis   . CAD (coronary artery disease)    mod 2V CAD (60% LAD, 70-80% mid CX) by 01/2008 cath  . CHF (congestive heart failure) ([place])   . Diabetes mellitus    dx 10 plus yrs ago  . Dilated cardiomyopathy ([facility])    now resolved, EF 60-65% echo 10/11/11 (Dr. Marlou Porch)  . Diverticulosis   . Hyperlipidemia   . Hypertension   . Hypokalemia 01/09/2017  . Lung mass   . MR (mitral regurgitation)   . Osteopenia     Past Surgical History:  Procedure Laterality Date  . ABDOMINAL HYSTERECTOMY    . APPENDECTOMY    . EYE SURGERY     bil cataracts  . KNEE ARTHROSCOPY     ?  left  knee  . TONSILLECTOMY      Current Outpatient Medications  Medication Sig Dispense Refill  . amLODipine (NORVASC) 5 MG tablet Take 1 tablet (5 mg total) by mouth at bedtime. 30 tablet 6  . aspirin 81 MG tablet  Take 81 mg by mouth daily.    Marland Kitchen atorvastatin (LIPITOR) 40 MG tablet Take 40 mg by mouth daily at 6 PM.     . divalproex (DEPAKOTE ER) 250 MG 24 hr tablet Take 250 mg by mouth daily.    Marland Kitchen donepezil (ARICEPT) 10 MG tablet Take 1 tablet daily 90 tablet 3  . furosemide (LASIX) 40 MG tablet Take 1 tablet (40 mg total) by mouth every other day. 45 tablet 2  . glipiZIDE (GLUCOTROL) 5 MG tablet Take 2.5 mg by mouth daily.    Marland Kitchen levalbuterol (XOPENEX HFA) 45 MCG/ACT inhaler INHALE 2 PUFFS EVERY 4-6 HRS AS NEEDED  3  . metoprolol succinate (TOPROL-XL) 100 MG 24 hr tablet TAKE 1 TABLET (100 MG TOTAL) BY MOUTH DAILY. TAKE WITH OR IMMEDIATELY FOLLOWING A MEAL. 90 tablet 2    . mirtazapine (REMERON) 15 MG tablet Take 15 mg by mouth at bedtime.    . pantoprazole (PROTONIX) 40 MG tablet Take 40 mg by mouth daily.     . potassium chloride SA (K-DUR,KLOR-CON) 20 MEQ tablet Take 2 tablets (40 mEq total) by mouth daily. 20 tablet 0  . PROAIR HFA 108 (90 Base) MCG/ACT inhaler Inhale 2 puffs into the lungs every 6 (six) hours as needed for wheezing or shortness of breath.  6   No current facility-administered medications for this visit.     Allergies:    Allergies  Allergen Reactions  . Moxifloxacin     REACTION: hives  . Penicillins     REACTION: hives  . Prednisone     REACTION: hives    Social History:  The patient  reports that she quit smoking about 46 years ago. Her smoking use included cigarettes. She smoked 0.50 packs per day. She has never used smokeless tobacco. She reports that she does not drink alcohol or use drugs.   ROS:  Unless specified above all other negative  PHYSICAL EXAM: VS:  BP 110/68   Pulse 99   Ht 5\' 5"  (1.651 m)   Wt 141 lb 12.8 oz (64.3 kg)   SpO2 96%   BMI 23.60 kg/m  GEN: Well nourished, well developed, in no acute distress  HEENT: normal  Neck: no JVD, carotid bruits, or masses Cardiac: RRR; no murmurs, rubs, or gallops,no edema  Respiratory:  clear to auscultation bilaterally, normal work of breathing GI: soft, nontender, nondistended, + BS MS: no deformity or atrophy  Skin: warm and dry, no rash Neuro:  Alert and Oriented x 3, Strength and sensation are intact Psych: euthymic mood, full affect     EKG:  EKG ordered today 01/13/18 shows sinus tachycardia 116 with occasional PAC, left anterior fascicular block, LVH.  Personally reviewed.  04/16/17 shows sinus tachycardia rate 104 with left anterior fascicular block, nonspecific ST-T wave changes. Personally viewed 04/01/16-sinus rhythm, 88, left anterior fascicular block, borderline LVH, nonspecific ST-T wave changes personally viewed-no significant change from  prior-12/02/13-will rhythm, sinus arrhythmia, left axis deviation/left anterior fascicular block, LVH, nonspecific ST changes, heart rate 73  ECHO 04/2017: - Left ventricle: The cavity size was normal. Systolic function was   normal. The estimated ejection fraction was in the range of 55%   to 60%. Wall motion was normal; there were no regional wall   motion abnormalities. Doppler parameters are consistent with   abnormal left ventricular relaxation (grade 1 diastolic   dysfunction).    ECHO 2012:   1. Normal LV size and function. 2. There were no regional wall  motion abnormalities. 3. Left ventricular ejection fraction estimated by 2D at 60-65 percent. 4. Mild mitral valve regurgitation. 5. Trivial tricuspid regurgitation.  Labs: Albumin 3.4, hemoglobin 12.5, creatinine 1.0  CT scan on 12/26/16-no recurrence of lung cancer. Aortic atherosclerosis and coronary artery calcification noted. Pulmonary fibrotic changes noted.   ASSESSMENT AND PLAN:   History of dilated cardiomyopathy  - Ejection fraction previously 15%, now normal at 65%. Excellent.  Back on Toprol because of tachycardia.  Doing well on Toprol 100.  Non-small cell cancer of right lung, stage IA  - Radiation only   - Dr. Lovenia Kim signs of disease recurrence. Reviewed his office note from 01/09/18.  Excellent.  Pulmonary fibrosis   -  Dr. Melvyn Novas   She has not seen him in quite some time.  Seems stable.  Mitral regurgitation   - After cardiomyopathy resolved, mitral regurgitation also now mild.  Doing well, functional MR previously, no changes  Left anterior fascicular block   - Conduction disorder. No changes, no syncope.  Stable.  Essential hypertension  -Continue with Toprol-XL 100 mg.  Heart rate improved bisoprolol was discontinued.  Aortic atherosclerosis  - Continue with secondary prevention.  No changes.  Continue with secondary risk factor prevention  3 month follow up again. (Daughter  requests).   Signed, Candee Furbish, MD [facility]  08/04/2018 3:56 PM

## 2018-09-27 ENCOUNTER — Other Ambulatory Visit: Payer: Self-pay | Admitting: Cardiology

## 2018-10-31 ENCOUNTER — Other Ambulatory Visit: Payer: Self-pay | Admitting: Neurology

## 2018-10-31 DIAGNOSIS — F03B18 Unspecified dementia, moderate, with other behavioral disturbance: Secondary | ICD-10-CM

## 2018-10-31 DIAGNOSIS — F0391 Unspecified dementia with behavioral disturbance: Secondary | ICD-10-CM

## 2018-11-03 ENCOUNTER — Ambulatory Visit: Payer: Medicare Other | Admitting: Cardiology

## 2018-11-03 ENCOUNTER — Encounter: Payer: Self-pay | Admitting: Cardiology

## 2018-11-03 VITALS — BP 132/90 | HR 102 | Ht 65.0 in | Wt 144.0 lb

## 2018-11-03 DIAGNOSIS — I2510 Atherosclerotic heart disease of native coronary artery without angina pectoris: Secondary | ICD-10-CM | POA: Diagnosis not present

## 2018-11-03 DIAGNOSIS — I340 Nonrheumatic mitral (valve) insufficiency: Secondary | ICD-10-CM | POA: Diagnosis not present

## 2018-11-03 DIAGNOSIS — I2583 Coronary atherosclerosis due to lipid rich plaque: Secondary | ICD-10-CM

## 2018-11-03 DIAGNOSIS — I700 Atherosclerosis of aorta: Secondary | ICD-10-CM

## 2018-11-03 DIAGNOSIS — I10 Essential (primary) hypertension: Secondary | ICD-10-CM

## 2018-11-03 NOTE — Patient Instructions (Signed)
Medication Instructions:  The current medical regimen is effective;  continue present plan and medications.  If you need a refill on your cardiac medications before your next appointment, please call your pharmacy.   Follow-Up: Follow up in 3 months with Dr Marlou Porch.  Thank you for choosing [facility]!!

## 2018-11-03 NOTE — Progress Notes (Signed)
Joan Hunter. [address], [address] [place], [place]  [address] Phone: [phone] Fax:  [phone]  Date:  11/03/2018   ID:  Joan Hunter, DOB [birth year], MRN [id number]  PCP:  Joan Carol, MD   History of Present Illness: Joan Hunter is a 84 y.o. female with moderate coronary artery disease prior catheterization in 2009, prior idiopathic cardiomyopathy (EF was 15%) now resolved 55%, mitral regurgitation mild here for followup.  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 Dr. Tammi Hunter. Radiation only.   Dr. Melvyn Hunter changed Coreg to Bystolic to bisoprolol, but she is now back on Toprol. COPD/bronchitis. Treating hiatal hernia with Protonix as well.   Continuing with atorvastatin, hyperlipidemia.  She experienced some dizziness when standing and her medications were previously reduced. This has improved.  Her daughter, Joan Hunter, helps her out significantly but she also make sure that she gets up and moves around adequately.   I encouraged her to use her walker. She has a split-level house. Concerned about stair use.   04/16/17-she has been having some more deconditioning, or shortness of breath with activity. Her daughter still has to motivate her to get up she states. She denies any chest pain. No orthopnea. No edema. No increase in weight.   01/13/17-mild shortness of breath with activity, no significant change.  She is trying to maintain her weight.  Mild shortness of breath with activity, she is trying to maintain her weight.  No significant change.  Her heart rate seems to be increased today.  Her daughter does not think that she has been taking her bisoprolol or amlodipine.  Her blood pressure is quite elevated as well. Heart rate 116 sinus tach  04/21/18 -still having some sinus tachycardia despite metoprolol 50.  We are increasing to 100.  She has had some wheezing when walking.  Mild cough.   Has not seen Dr. Melvyn Hunter in quite some time.  She is seeing Dr. Delfina Hunter.  No syncope, no orthopnea, no PND, no bleeding.  08/04/2018-no chest pain fevers chills nausea vomiting syncope bleeding.  Still encouraging motion, movement.  Feels well.  11/03/2018-chief complaint follow-up for shortness of breath prior cardiomyopathy.  Overall she been doing quite well.  Still ambulating with walker.  Here with her daughter.  No chest pain fevers chills nausea vomiting syncope.  Weight has been fairly stable.   Wt Readings from Last 3 Encounters:  11/03/18 144 lb (65.3 kg)  08/04/18 141 lb 12.8 oz (64.3 kg)  07/02/18 140 lb 14.4 oz (63.9 kg)     Past Medical History:  Diagnosis Date  . Allergic rhinitis   . Arthritis   . CAD (coronary artery disease)    mod 2V CAD (60% LAD, 70-80% mid CX) by 01/2008 cath  . CHF (congestive heart failure) (Joan Hunter)   . Diabetes mellitus    dx 10 plus yrs ago  . Dilated cardiomyopathy ([place])    now resolved, EF 60-65% echo 10/11/11 (Dr. Marlou Hunter)  . Diverticulosis   . Hyperlipidemia   . Hypertension   . Hypokalemia 01/09/2017  . Lung mass   . MR (mitral regurgitation)   . Osteopenia     Past Surgical History:  Procedure Laterality Date  . ABDOMINAL HYSTERECTOMY    . APPENDECTOMY    . EYE SURGERY     bil cataracts  . KNEE ARTHROSCOPY     ?  left  knee  .  TONSILLECTOMY      Current Outpatient Medications  Medication Sig Dispense Refill  . amLODipine (NORVASC) 5 MG tablet TAKE 1 TABLET BY MOUTH EVERYDAY AT BEDTIME 30 tablet 11  . aspirin 81 MG tablet Take 81 mg by mouth daily.    Marland Kitchen atorvastatin (LIPITOR) 40 MG tablet Take 40 mg by mouth daily at 6 PM.     . divalproex (DEPAKOTE ER) 250 MG 24 hr tablet Take 250 mg by mouth daily.    Marland Kitchen donepezil (ARICEPT) 10 MG tablet TAKE 1 TABLET BY MOUTH EVERY DAY 90 tablet 1  . furosemide (LASIX) 40 MG tablet Take 1 tablet (40 mg total) by mouth every other day. 45 tablet 2  . glipiZIDE (GLUCOTROL) 5 MG tablet Take 2.5 mg  by mouth daily.    Marland Kitchen levalbuterol (XOPENEX HFA) 45 MCG/ACT inhaler INHALE 2 PUFFS EVERY 4-6 HRS AS NEEDED  3  . metoprolol succinate (TOPROL-XL) 100 MG 24 hr tablet TAKE 1 TABLET (100 MG TOTAL) BY MOUTH DAILY. TAKE WITH OR IMMEDIATELY FOLLOWING A MEAL. 90 tablet 2  . mirtazapine (REMERON) 15 MG tablet Take 15 mg by mouth at bedtime.    . pantoprazole (PROTONIX) 40 MG tablet Take 40 mg by mouth daily.     . potassium chloride SA (K-DUR,KLOR-CON) 20 MEQ tablet Take 2 tablets (40 mEq total) by mouth daily. 20 tablet 0  . PROAIR HFA 108 (90 Base) MCG/ACT inhaler Inhale 2 puffs into the lungs every 6 (six) hours as needed for wheezing or shortness of breath.  6   No current facility-administered medications for this visit.     Allergies:    Allergies  Allergen Reactions  . Moxifloxacin     REACTION: hives  . Penicillins     REACTION: hives  . Prednisone     REACTION: hives    Social History:  The patient  reports that she quit smoking about 46 years ago. Her smoking use included cigarettes. She smoked 0.50 packs per day. She has never used smokeless tobacco. She reports that she does not drink alcohol or use drugs.   ROS:  Unless specified above all other negative  PHYSICAL EXAM: VS:  BP 132/90   Pulse (!) 102   Ht 5\' 5"  (1.651 m)   Wt 144 lb (65.3 kg)   SpO2 99%   BMI 23.96 kg/m  GEN: Well nourished, well developed, in no acute distress  HEENT: normal  Neck: no JVD, carotid bruits, or masses Cardiac: RRR; 1/6 S murmur,no rubs, or gallops,no edema  Respiratory:  clear to auscultation bilaterally, normal work of breathing GI: soft, nontender, nondistended, + BS MS: no deformity or atrophy  Skin: warm and dry, no rash Neuro:  Alert and Oriented x 3, Strength and sensation are intact Psych: euthymic mood, full affect      EKG:   01/13/18 shows sinus tachycardia 116 with occasional PAC, left anterior fascicular block, LVH.  Personally reviewed.  04/16/17 shows sinus tachycardia  rate 104 with left anterior fascicular block, nonspecific ST-T wave changes. Personally viewed 04/01/16-sinus rhythm, 88, left anterior fascicular block, borderline LVH, nonspecific ST-T wave changes personally viewed-no significant change from prior-12/02/13-will rhythm, sinus arrhythmia, left axis deviation/left anterior fascicular block, LVH, nonspecific ST changes, heart rate 73  ECHO 04/2017: - Left ventricle: The cavity size was normal. Systolic function was   normal. The estimated ejection fraction was in the range of 55%   to 60%. Wall motion was normal; there were no regional wall  motion abnormalities. Doppler parameters are consistent with   abnormal left ventricular relaxation (grade 1 diastolic   dysfunction).    ECHO 2012:   1. Normal LV size and function. 2. There were no regional wall motion abnormalities. 3. Left ventricular ejection fraction estimated by 2D at 60-65 percent. 4. Mild mitral valve regurgitation. 5. Trivial tricuspid regurgitation.  Labs: Albumin 3.4, hemoglobin 12.5, creatinine 1.0  CT scan on 12/26/16-no recurrence of lung cancer. Aortic atherosclerosis and coronary artery calcification noted. Pulmonary fibrotic changes noted.   ASSESSMENT AND PLAN:   History of dilated cardiomyopathy  - Ejection fraction previously 15%, now normal at 65%. Excellent.  Back on Toprol because of tachycardia.  Doing well on Toprol 100.  No changes made today.  Continues to do well.  Non-small cell cancer of right lung, stage IA  - Radiation only   - Dr. Lovenia Kim signs of disease recurrence. Reviewed his office note from 01/09/18.  Excellent.  Notes reviewed.  No changes since her July visit.  Pulmonary fibrosis   -  Dr. Melvyn Hunter   She has not seen him in quite some time.  Seems stable.  Thankfully no changes  Mitral regurgitation   - After cardiomyopathy resolved, mitral regurgitation also now mild.  Doing well, functional MR previously, no changes.  Continue to monitor  clinically.  Overall feeling well.  Left anterior fascicular block   - Conduction disorder. No changes, no syncope.  Stable.  Essential hypertension  -Continue with Toprol-XL 100 mg.  Heart rate improved bisoprolol was discontinued.  Pulses about where it was previously at 99  Aortic atherosclerosis  - Continue with secondary prevention.  No changes.  Continue with secondary risk factor prevention.  Continue with good blood pressure control.  3 month follow up again. (Daughter requests).   Signed, Candee Furbish, MD [facility]  11/03/2018 2:57 PM

## 2018-11-04 ENCOUNTER — Other Ambulatory Visit: Payer: Self-pay | Admitting: Cardiology

## 2018-11-27 ENCOUNTER — Other Ambulatory Visit: Payer: Self-pay | Admitting: Neurology

## 2018-11-27 DIAGNOSIS — F03B18 Unspecified dementia, moderate, with other behavioral disturbance: Secondary | ICD-10-CM

## 2018-11-27 DIAGNOSIS — F0391 Unspecified dementia with behavioral disturbance: Secondary | ICD-10-CM

## 2018-11-30 ENCOUNTER — Other Ambulatory Visit: Payer: Self-pay

## 2018-11-30 DIAGNOSIS — F0391 Unspecified dementia with behavioral disturbance: Secondary | ICD-10-CM

## 2018-11-30 DIAGNOSIS — F03B18 Unspecified dementia, moderate, with other behavioral disturbance: Secondary | ICD-10-CM

## 2018-11-30 MED ORDER — DONEPEZIL HCL 10 MG PO TABS: ORAL_TABLET | ORAL | 1 refills | Status: DC

## 2019-01-05 ENCOUNTER — Other Ambulatory Visit: Payer: Medicare Other

## 2019-01-05 ENCOUNTER — Ambulatory Visit (HOSPITAL_COMMUNITY)
Admission: RE | Admit: 2019-01-05 | Discharge: 2019-01-05 | Disposition: A | Discharge status: Home / Self Care | Payer: Medicare Other | Source: Ambulatory Visit | Attending: Internal Medicine | Admitting: Internal Medicine

## 2019-01-05 ENCOUNTER — Inpatient Hospital Stay: Payer: Medicare Other | Attending: Internal Medicine

## 2019-01-05 DIAGNOSIS — I340 Nonrheumatic mitral (valve) insufficiency: Secondary | ICD-10-CM | POA: Insufficient documentation

## 2019-01-05 DIAGNOSIS — I509 Heart failure, unspecified: Secondary | ICD-10-CM | POA: Diagnosis not present

## 2019-01-05 DIAGNOSIS — C3490 Malignant neoplasm of unspecified part of unspecified bronchus or lung: Secondary | ICD-10-CM

## 2019-01-05 DIAGNOSIS — Z923 Personal history of irradiation: Secondary | ICD-10-CM | POA: Diagnosis not present

## 2019-01-05 DIAGNOSIS — E785 Hyperlipidemia, unspecified: Secondary | ICD-10-CM | POA: Insufficient documentation

## 2019-01-05 DIAGNOSIS — M8580 Other specified disorders of bone density and structure, unspecified site: Secondary | ICD-10-CM | POA: Diagnosis not present

## 2019-01-05 DIAGNOSIS — R5383 Other fatigue: Secondary | ICD-10-CM | POA: Diagnosis not present

## 2019-01-05 DIAGNOSIS — C3431 Malignant neoplasm of lower lobe, right bronchus or lung: Secondary | ICD-10-CM | POA: Insufficient documentation

## 2019-01-05 DIAGNOSIS — I2510 Atherosclerotic heart disease of native coronary artery without angina pectoris: Secondary | ICD-10-CM | POA: Insufficient documentation

## 2019-01-05 DIAGNOSIS — I420 Dilated cardiomyopathy: Secondary | ICD-10-CM | POA: Diagnosis not present

## 2019-01-05 DIAGNOSIS — E119 Type 2 diabetes mellitus without complications: Secondary | ICD-10-CM | POA: Insufficient documentation

## 2019-01-05 DIAGNOSIS — Z7982 Long term (current) use of aspirin: Secondary | ICD-10-CM | POA: Insufficient documentation

## 2019-01-05 DIAGNOSIS — Z79899 Other long term (current) drug therapy: Secondary | ICD-10-CM | POA: Diagnosis not present

## 2019-01-05 DIAGNOSIS — I110 Hypertensive heart disease with heart failure: Secondary | ICD-10-CM | POA: Diagnosis not present

## 2019-01-05 DIAGNOSIS — M129 Arthropathy, unspecified: Secondary | ICD-10-CM | POA: Insufficient documentation

## 2019-01-05 LAB — CBC WITH DIFFERENTIAL (CANCER CENTER ONLY)
ABS IMMATURE GRANULOCYTES: 0.03 10*3/uL (ref 0.00–0.07)
BASOS ABS: 0 10*3/uL (ref 0.0–0.1)
BASOS PCT: 1 %
EOS ABS: 0.2 10*3/uL (ref 0.0–0.5)
Eosinophils Relative: 2 %
HCT: 38.5 % (ref 36.0–46.0)
Hemoglobin: 12.4 g/dL (ref 12.0–15.0)
IMMATURE GRANULOCYTES: 0 %
Lymphocytes Relative: 19 %
Lymphs Abs: 1.6 10*3/uL (ref 0.7–4.0)
MCH: 28.7 pg (ref 26.0–34.0)
MCHC: 32.2 g/dL (ref 30.0–36.0)
MCV: 89.1 fL (ref 80.0–100.0)
MONOS PCT: 9 %
Monocytes Absolute: 0.7 10*3/uL (ref 0.1–1.0)
NEUTROS ABS: 5.5 10*3/uL (ref 1.7–7.7)
NEUTROS PCT: 69 %
NRBC: 0 % (ref 0.0–0.2)
PLATELETS: 192 10*3/uL (ref 150–400)
RBC: 4.32 MIL/uL (ref 3.87–5.11)
RDW: 14.2 % (ref 11.5–15.5)
WBC Count: 8 10*3/uL (ref 4.0–10.5)

## 2019-01-05 LAB — CMP (CANCER CENTER ONLY)
ALBUMIN: 3.5 g/dL (ref 3.5–5.0)
ALK PHOS: 98 U/L (ref 38–126)
ALT: 9 U/L (ref 0–44)
ANION GAP: 10 (ref 5–15)
AST: 15 U/L (ref 15–41)
BILIRUBIN TOTAL: 0.5 mg/dL (ref 0.3–1.2)
BUN: 23 mg/dL (ref 8–23)
CALCIUM: 9.1 mg/dL (ref 8.9–10.3)
CO2: 26 mmol/L (ref 22–32)
Chloride: 106 mmol/L (ref 98–111)
Creatinine: 1.34 mg/dL — ABNORMAL HIGH (ref 0.44–1.00)
GFR, EST AFRICAN AMERICAN: 41 mL/min — AB (ref >60)
GFR, Estimated: 35 mL/min — ABNORMAL LOW (ref >60)
GLUCOSE: 138 mg/dL — AB (ref 70–99)
Potassium: 3.7 mmol/L (ref 3.5–5.1)
SODIUM: 142 mmol/L (ref 135–145)
TOTAL PROTEIN: 7.7 g/dL (ref 6.5–8.1)

## 2019-01-05 MED ORDER — IOHEXOL 300 MG/ML  SOLN
75.0000 mL | Freq: Once | INTRAMUSCULAR | Status: AC | PRN
  Administered 2019-01-05: 60 mL via INTRAVENOUS

## 2019-01-05 MED ORDER — SODIUM CHLORIDE (PF) 0.9 % IJ SOLN
INTRAMUSCULAR | Status: AC
  Filled 2019-01-05: qty 50

## 2019-01-07 ENCOUNTER — Inpatient Hospital Stay (HOSPITAL_BASED_OUTPATIENT_CLINIC_OR_DEPARTMENT_OTHER): Payer: Medicare Other | Admitting: Internal Medicine

## 2019-01-07 ENCOUNTER — Encounter: Payer: Self-pay | Admitting: Internal Medicine

## 2019-01-07 ENCOUNTER — Telehealth: Payer: Self-pay | Admitting: Internal Medicine

## 2019-01-07 VITALS — BP 162/90 | HR 92 | Temp 98.2°F | Resp 20 | Ht 65.0 in | Wt 140.4 lb

## 2019-01-07 DIAGNOSIS — C3431 Malignant neoplasm of lower lobe, right bronchus or lung: Secondary | ICD-10-CM | POA: Diagnosis not present

## 2019-01-07 DIAGNOSIS — C3491 Malignant neoplasm of unspecified part of right bronchus or lung: Secondary | ICD-10-CM

## 2019-01-07 DIAGNOSIS — I2510 Atherosclerotic heart disease of native coronary artery without angina pectoris: Secondary | ICD-10-CM | POA: Diagnosis not present

## 2019-01-07 DIAGNOSIS — M129 Arthropathy, unspecified: Secondary | ICD-10-CM | POA: Diagnosis not present

## 2019-01-07 DIAGNOSIS — E785 Hyperlipidemia, unspecified: Secondary | ICD-10-CM

## 2019-01-07 DIAGNOSIS — R5383 Other fatigue: Secondary | ICD-10-CM

## 2019-01-07 DIAGNOSIS — I10 Essential (primary) hypertension: Secondary | ICD-10-CM

## 2019-01-07 DIAGNOSIS — I420 Dilated cardiomyopathy: Secondary | ICD-10-CM

## 2019-01-07 DIAGNOSIS — C3490 Malignant neoplasm of unspecified part of unspecified bronchus or lung: Secondary | ICD-10-CM

## 2019-01-07 DIAGNOSIS — I340 Nonrheumatic mitral (valve) insufficiency: Secondary | ICD-10-CM

## 2019-01-07 DIAGNOSIS — Z7982 Long term (current) use of aspirin: Secondary | ICD-10-CM

## 2019-01-07 DIAGNOSIS — M8580 Other specified disorders of bone density and structure, unspecified site: Secondary | ICD-10-CM

## 2019-01-07 DIAGNOSIS — Z923 Personal history of irradiation: Secondary | ICD-10-CM

## 2019-01-07 DIAGNOSIS — I110 Hypertensive heart disease with heart failure: Secondary | ICD-10-CM

## 2019-01-07 DIAGNOSIS — Z79899 Other long term (current) drug therapy: Secondary | ICD-10-CM

## 2019-01-07 DIAGNOSIS — I509 Heart failure, unspecified: Secondary | ICD-10-CM

## 2019-01-07 DIAGNOSIS — E119 Type 2 diabetes mellitus without complications: Secondary | ICD-10-CM

## 2019-01-07 NOTE — Telephone Encounter (Signed)
Scheduled appt per 01/23 los. ° °Printed calendar and avs. °

## 2019-01-07 NOTE — Progress Notes (Signed)
[facility] [phone]   [phone]  OFFICE PROGRESS NOTE  [name], MD [address] [place]  [address]  DIAGNOSIS: Stage IA (T1b, N0, M0) non-small cell lung cancer consistent with adenocarcinoma involving the right lower lobe, diagnosed in September 2014.  PRIOR THERAPY: Status post curative stereotactic radiotherapy to the right lower lobe lung nodule under the care of Dr. Tammi Klippel completed on 10/29/2013.  CURRENT THERAPY: Observation.  INTERVAL HISTORY: Joan Hunter 85 y.o. female returns to the clinic today for follow-up visit accompanied by her daughter.  The patient is feeling fine today with no concerning complaints except for fatigue and mild shortness of breath with exertion.  She denied having any chest pain, cough or hemoptysis.  She denied having any fever or chills.  She has no nausea, vomiting, diarrhea or constipation.  She denied having any headache or visual changes.  The patient had repeat CT scan of the chest performed recently and she is here for evaluation and discussion of her scan results.  MEDICAL HISTORY: Past Medical History:  Diagnosis Date  . Allergic rhinitis   . Arthritis   . CAD (coronary artery disease)    mod 2V CAD (60% LAD, 70-80% mid CX) by 01/2008 cath  . CHF (congestive heart failure) ([place])   . Diabetes mellitus    dx 10 plus yrs ago  . Dilated cardiomyopathy ([place])    now resolved, EF 60-65% echo 10/11/11 (Dr. Marlou Porch)  . Diverticulosis   . Hyperlipidemia   . Hypertension   . Hypokalemia 01/09/2017  . Lung mass   . MR (mitral regurgitation)   . Osteopenia     ALLERGIES:  is allergic to moxifloxacin; penicillins; and prednisone.  MEDICATIONS:  Current Outpatient Medications  Medication Sig Dispense Refill  . amLODipine (NORVASC) 5 MG tablet TAKE 1 TABLET BY MOUTH EVERYDAY AT BEDTIME 30 tablet 11  . aspirin 81 MG tablet Take 81 mg by mouth daily.    Marland Kitchen atorvastatin  (LIPITOR) 40 MG tablet Take 40 mg by mouth daily at 6 PM.     . divalproex (DEPAKOTE ER) 250 MG 24 hr tablet Take 250 mg by mouth daily.    Marland Kitchen donepezil (ARICEPT) 10 MG tablet TAKE 1 TABLET BY MOUTH EVERY DAY 90 tablet 1  . furosemide (LASIX) 40 MG tablet TAKE 1 TABLET (40 MG TOTAL) BY MOUTH EVERY OTHER DAY. 45 tablet 3  . glipiZIDE (GLUCOTROL) 5 MG tablet Take 2.5 mg by mouth daily.    Marland Kitchen levalbuterol (XOPENEX HFA) 45 MCG/ACT inhaler INHALE 2 PUFFS EVERY 4-6 HRS AS NEEDED  3  . metoprolol succinate (TOPROL-XL) 100 MG 24 hr tablet TAKE 1 TABLET (100 MG TOTAL) BY MOUTH DAILY. TAKE WITH OR IMMEDIATELY FOLLOWING A MEAL. 90 tablet 2  . mirtazapine (REMERON) 15 MG tablet Take 15 mg by mouth at bedtime.    . pantoprazole (PROTONIX) 40 MG tablet Take 40 mg by mouth daily.     . potassium chloride SA (K-DUR,KLOR-CON) 20 MEQ tablet Take 2 tablets (40 mEq total) by mouth daily. 20 tablet 0  . PROAIR HFA 108 (90 Base) MCG/ACT inhaler Inhale 2 puffs into the lungs every 6 (six) hours as needed for wheezing or shortness of breath.  6   No current facility-administered medications for this visit.     SURGICAL HISTORY:  Past Surgical History:  Procedure Laterality Date  . ABDOMINAL HYSTERECTOMY    . APPENDECTOMY    .  EYE SURGERY     bil cataracts  . KNEE ARTHROSCOPY     ?  left  knee  . TONSILLECTOMY      REVIEW OF SYSTEMS:  Constitutional: positive for fatigue Eyes: negative Ears, nose, mouth, throat, and face: negative Respiratory: positive for dyspnea on exertion Cardiovascular: negative Gastrointestinal: negative Genitourinary:negative Integument/breast: negative Hematologic/lymphatic: negative Musculoskeletal:negative Neurological: negative Behavioral/Psych: negative Endocrine: negative Allergic/Immunologic: negative   PHYSICAL EXAMINATION: General appearance: alert, cooperative, fatigued and no distress Head: Normocephalic, without obvious abnormality, atraumatic Neck: no adenopathy,  no JVD, supple, symmetrical, trachea midline and thyroid not enlarged, symmetric, no tenderness/mass/nodules Lymph nodes: Cervical, supraclavicular, and axillary nodes normal. Resp: clear to auscultation bilaterally Back: symmetric, no curvature. ROM normal. No CVA tenderness. Cardio: regular rate and rhythm, S1, S2 normal, no murmur, click, rub or gallop GI: soft, non-tender; bowel sounds normal; no masses,  no organomegaly Extremities: extremities normal, atraumatic, no cyanosis or edema Neurologic: Alert and oriented X 3, normal strength and tone. Normal symmetric reflexes. Normal coordination and gait  ECOG PERFORMANCE STATUS: 1 - Symptomatic but completely ambulatory  Blood pressure (!) 162/90, pulse 92, temperature 98.2 F (36.8 C), temperature source Oral, resp. rate 20, height 5\' 5"  (1.651 m), weight 140 lb 6.4 oz (63.7 kg), SpO2 97 %.  LABORATORY DATA: Lab Results  Component Value Date   WBC 8.0 01/05/2019   HGB 12.4 01/05/2019   HCT 38.5 01/05/2019   MCV 89.1 01/05/2019   PLT 192 01/05/2019      Chemistry      Component Value Date/Time   NA 142 01/05/2019 1103   NA 144 12/26/2016 1022   K 3.7 01/05/2019 1103   K 2.9 (LL) 12/26/2016 1022   CL 106 01/05/2019 1103   CO2 26 01/05/2019 1103   CO2 29 12/26/2016 1022   BUN 23 01/05/2019 1103   BUN 16.7 12/26/2016 1022   CREATININE 1.34 (H) 01/05/2019 1103   CREATININE 1.0 12/26/2016 1022      Component Value Date/Time   CALCIUM 9.1 01/05/2019 1103   CALCIUM 9.3 12/26/2016 1022   ALKPHOS 98 01/05/2019 1103   ALKPHOS 105 12/26/2016 1022   AST 15 01/05/2019 1103   AST 16 12/26/2016 1022   ALT 9 01/05/2019 1103   ALT 8 12/26/2016 1022   BILITOT 0.5 01/05/2019 1103   BILITOT 0.84 12/26/2016 1022       RADIOGRAPHIC STUDIES: Ct Chest W Contrast  Result Date: 01/05/2019 CLINICAL DATA:  Lung cancer follow-up. Non-small cell lung cancer. Radiation therapy complete. EXAM: CT CHEST WITH CONTRAST TECHNIQUE:  Multidetector CT imaging of the chest was performed during intravenous contrast administration. CONTRAST:  60mL OMNIPAQUE IOHEXOL 300 MG/ML  SOLN COMPARISON:  Coronary artery calcification and aortic atherosclerotic calcification. FINDINGS: Cardiovascular: No axillary or supraclavicular adenopathy. No mediastinal adenopathy. RIGHT infrahilar rounded node measures 20 mm by 15 mm (image 65/2) compares with 15 mm x 14 mm for measurable interval enlargement Large hiatal hernia posterior the heart Mediastinum/Nodes: Lungs/Pleura: Angular consolidation posterior to the RIGHT hilum is similar to prior. Dominant nodule in the RIGHT lower lobe lateral nodule measuring 14 x 12 mm (image 106/5) is measures slightly larger than 13 x 12 mm. SMall cluster of nodules in the medial aspect of the RIGHT middle lobe (image 78/5) are similar. Mild focus of ground-glass opacity in the RIGHT lower lobe are similar. The LEFT lung there is volume loss.  New or suspicious nodules. Upper Abdomen: Limited view of the liver, kidneys, pancreas are unremarkable. Normal  adrenal glands. Musculoskeletal: No aggressive osseous lesion. IMPRESSION: 1. Interval enlargement of RIGHT hilar lymph node concerning for lung cancer recurrence. Consider FDG PET scan. 2. Angular consolidation posterior the RIGHT hila similar prior. 3. Potential interval enlargement of RIGHT lower lobe pulmonary nodule. Recommend close attention on follow-up. 4. Additional smaller scattered nodules are unchanged. Electronically Signed   By: Suzy Bouchard M.D.   On: 01/05/2019 14:44   ASSESSMENT AND PLAN:  This is a very pleasant 85 years old African-American female with stage IA non-small cell lung cancer. She status post curative SBRT to the right lower lobe nodule completed in November 2014. The patient is currently on observation. The patient is doing fine today with no concerning complaints except for the baseline shortness of breath. She had repeat CT scan of the  chest performed recently.  I personally and independently reviewed the scan images and discussed the results with the patient and her daughter today. Unfortunately her scan showed further increase in the size of the right hilar lymph nodes definitely concerning for disease recurrence.  There was also slight increase in the right lower lobe nodule. I recommended for the patient to see Dr. Tammi Klippel for reevaluation and consideration of palliative radiotherapy to these lesions.  I do not think she will need concurrent chemotherapy with her radiation at this point. I will see the patient back for follow-up visit in 6 months for evaluation with repeat CT scan of the chest. For hypertension, the patient will continue with her current blood pressure medications. She was advised to call immediately if she has any concerning symptoms in the interval. The patient voices understanding of current disease status and treatment options and is in agreement with the current care plan. All questions were answered. The patient knows to call the clinic with any problems, questions or concerns. We can certainly see the patient much sooner if necessary.  Disclaimer: This note was dictated with voice recognition software. Similar sounding words can inadvertently be transcribed and may not be corrected upon review.

## 2019-01-13 ENCOUNTER — Other Ambulatory Visit: Payer: Self-pay

## 2019-01-13 ENCOUNTER — Ambulatory Visit: Payer: Medicare Other | Admitting: Neurology

## 2019-01-13 ENCOUNTER — Encounter: Payer: Self-pay | Admitting: Neurology

## 2019-01-13 DIAGNOSIS — F0391 Unspecified dementia with behavioral disturbance: Secondary | ICD-10-CM

## 2019-01-13 DIAGNOSIS — F03B18 Unspecified dementia, moderate, with other behavioral disturbance: Secondary | ICD-10-CM

## 2019-01-13 MED ORDER — DONEPEZIL HCL 10 MG PO TABS: ORAL_TABLET | ORAL | 3 refills | Status: DC

## 2019-01-13 MED ORDER — DIVALPROEX SODIUM ER 250 MG PO TB24: ORAL_TABLET | ORAL | 3 refills | Status: DC

## 2019-01-13 NOTE — Patient Instructions (Signed)
Continue all your medications. Follow-up in 8 months, call for any changes  FALL PRECAUTIONS: Be cautious when walking. Scan the area for obstacles that may increase the risk of trips and falls. When getting up in the mornings, sit up at the edge of the bed for a few minutes before getting out of bed. Consider elevating the bed at the head end to avoid drop of blood pressure when getting up. Walk always in a well-lit room (use night lights in the walls). Avoid area rugs or power cords from appliances in the middle of the walkways. Use a walker or a cane if necessary and consider physical therapy for balance exercise. Get your eyesight checked regularly.  HOME SAFETY: Consider the safety of the kitchen when operating appliances like stoves, microwave oven, and blender. Consider having supervision and share cooking responsibilities until no longer able to participate in those. Accidents with firearms and other hazards in the house should be identified and addressed as well.  ABILITY TO BE LEFT ALONE: If patient is unable to contact 911 operator, consider using LifeLine, or when the need is there, arrange for someone to stay with patients. Smoking is a fire hazard, consider supervision or cessation. Risk of wandering should be assessed by caregiver and if detected at any point, supervision and safe proof recommendations should be instituted.  RECOMMENDATIONS FOR ALL PATIENTS WITH MEMORY PROBLEMS: 1. Continue to exercise (Recommend 30 minutes of walking everyday, or 3 hours every week) 2. Increase social interactions - continue going to [place] and enjoy social gatherings with friends and family 3. Eat healthy, avoid fried foods and eat more fruits and vegetables 4. Maintain adequate blood pressure, blood sugar, and blood cholesterol level. Reducing the risk of stroke and cardiovascular disease also helps promoting better memory. 5. Avoid stressful situations. Live a simple life and avoid aggravations.  Organize your time and prepare for the next day in anticipation. 6. Sleep well, avoid any interruptions of sleep and avoid any distractions in the bedroom that may interfere with adequate sleep quality 7. Avoid sugar, avoid sweets as there is a strong link between excessive sugar intake, diabetes, and cognitive impairment The Mediterranean diet has been shown to help patients reduce the risk of progressive memory disorders and reduces cardiovascular risk. This includes eating fish, eat fruits and green leafy vegetables, nuts like almonds and hazelnuts, walnuts, and also use olive oil. Avoid fast foods and fried foods as much as possible. Avoid sweets and sugar as sugar use has been linked to worsening of memory function.  There is always a concern of gradual progression of memory problems. If this is the case, then we may need to adjust level of care according to patient needs. Support, both to the patient and caregiver, should then be put into place.

## 2019-01-13 NOTE — Progress Notes (Signed)
NEUROLOGY FOLLOW UP OFFICE NOTE  Joan Hunter [id number] 1927/11/25  HISTORY OF PRESENT ILLNESS: I had the pleasure of seeing Joan Hunter in follow-up in the neurology clinic on 01/13/2019. The patient was last seen 7 months ago for moderate dementia, likely Lewy Body dementia with significant hallucinations. She is again accompanied by her daughter who helps provides the history today. MRI brain showed moderate to severe chronic microvascular disease. She had side effects on higher dose of Depakote ("she was talking real crazy"), and has been taking Depakote 250mg  qhs. Her daughter states she has not been reporting hallucinations lately. Memory continues to decline, her daughter reports that she refuses to do things but can do them when encouraged. She is on Donepezil 10mg  daily. She can dress herself and needs help to get into the shower but washes herself. Daughter manages medications and finances. She does not drive. No paranoia, sleep at night is good but she sleeps a lot during the day. Appetite is good. She ambulates with a cane, no falls. Her daughter reports recent abnormal CT chest with plans for radiation. She denies any headaches, dizziness, vision changes, focal numbness/tingling/weakness.   HPI 12/06/2016: This is an 85 yo RH woman with a history of diabetes, hypertension, CAD, lung cancer, with worsening memory and hallucinations. When asked about her memory, she states there are some things she remembers and some she does not. She lives with her daughter and states that she cooks (she does not). She denies misplacing things but daughter shakes her head. Her daughter administers medications, otherwise she would not take them. Her daughter states that memory changes started only around 2 months ago, prior to this it was "pretty good." She started having difficulties remembering the days of the week, asking her grandson why he was not in school, then a few minutes later asking him  the same question. She used to fix her own medications, but over the past month, her daughter has to physically watch her take them. She used to live by herself but after 2 falls, her daughter has lived with her for the past 2 years. Daughter has always been in charge of bill payments. She stopped driving 5 years ago due to bad knees. She denied getting lost driving, her daughter states she can still give directions when someone else is driving.  Her daughter's main concern are the hallucinations. The patient thinks these are real and denies having any hallucinations. Her daughter cites multiple instances, these started in June but have been worsening. She would talk about people across the street or someone sitting outside, a dog across the street in the car. She told her daughter she is in trouble with the people she works with, that they were downstairs. She has seen a cat on the bed, or something under the bed, something eating her shoes. She would ask her daughter if the man downstairs was gone. She said there was a green car in the driveway, which was actually a large tarp. She saw a little boy sitting up on the tree outside. She called her daughter "Momma" this morning. She denies any auditory hallucinations. Her daughter denies any personality changes, no paranoia associated with these hallucinations. The patient denies any headaches, dizziness, diplopia, dysarthria, dysphagia, neck/back pain, focal numbness/tingling/weakness, bowel/bladder dysfunction. No anosmia, tremors, no falls. They deny any significant head injuries. No family history of dementia.  PAST MEDICAL HISTORY: Past Medical History:  Diagnosis Date  . Allergic rhinitis   . Arthritis   .  CAD (coronary artery disease)    mod 2V CAD (60% LAD, 70-80% mid CX) by 01/2008 cath  . CHF (congestive heart failure) ([place])   . Diabetes mellitus    dx 10 plus yrs ago  . Dilated cardiomyopathy ([place])    now resolved, EF 60-65% echo 10/11/11  (Dr. Marlou Porch)  . Diverticulosis   . Hyperlipidemia   . Hypertension   . Hypokalemia 01/09/2017  . Lung mass   . MR (mitral regurgitation)   . Osteopenia     MEDICATIONS: Current Outpatient Medications on File Prior to Visit  Medication Sig Dispense Refill  . amLODipine (NORVASC) 5 MG tablet TAKE 1 TABLET BY MOUTH EVERYDAY AT BEDTIME 30 tablet 11  . aspirin 81 MG tablet Take 81 mg by mouth daily.    Marland Kitchen atorvastatin (LIPITOR) 40 MG tablet Take 40 mg by mouth daily at 6 PM.     . divalproex (DEPAKOTE ER) 250 MG 24 hr tablet Take 250 mg by mouth daily.    Marland Kitchen donepezil (ARICEPT) 10 MG tablet TAKE 1 TABLET BY MOUTH EVERY DAY 90 tablet 1  . furosemide (LASIX) 40 MG tablet TAKE 1 TABLET (40 MG TOTAL) BY MOUTH EVERY OTHER DAY. 45 tablet 3  . glipiZIDE (GLUCOTROL) 5 MG tablet Take 2.5 mg by mouth daily.    Marland Kitchen levalbuterol (XOPENEX HFA) 45 MCG/ACT inhaler INHALE 2 PUFFS EVERY 4-6 HRS AS NEEDED  3  . metoprolol succinate (TOPROL-XL) 100 MG 24 hr tablet TAKE 1 TABLET (100 MG TOTAL) BY MOUTH DAILY. TAKE WITH OR IMMEDIATELY FOLLOWING A MEAL. 90 tablet 2  . mirtazapine (REMERON) 15 MG tablet Take 15 mg by mouth at bedtime.    . pantoprazole (PROTONIX) 40 MG tablet Take 40 mg by mouth daily.     . potassium chloride SA (K-DUR,KLOR-CON) 20 MEQ tablet Take 2 tablets (40 mEq total) by mouth daily. 20 tablet 0  . PROAIR HFA 108 (90 Base) MCG/ACT inhaler Inhale 2 puffs into the lungs every 6 (six) hours as needed for wheezing or shortness of breath.  6   No current facility-administered medications on file prior to visit.     ALLERGIES: Allergies  Allergen Reactions  . Moxifloxacin     REACTION: hives  . Penicillins     REACTION: hives  . Prednisone     REACTION: hives    FAMILY HISTORY: Family History  Problem Relation Age of Onset  . Colon cancer Mother   . Heart disease Brother     SOCIAL HISTORY: Social History   Socioeconomic History  . Marital status: Widowed    Spouse name: Not on  file  . Number of children: Not on file  . Years of education: Not on file  . Highest education level: Not on file  Occupational History  . Not on file  Social Needs  . Financial resource strain: Not on file  . Food insecurity:    Worry: Not on file    Inability: Not on file  . Transportation needs:    Medical: Not on file    Non-medical: Not on file  Tobacco Use  . Smoking status: Former Smoker    Packs/day: 0.50    Types: Cigarettes    Last attempt to quit: 06/15/1972    Years since quitting: 46.6  . Smokeless tobacco: Never Used  Substance and Sexual Activity  . Alcohol use: No  . Drug use: No  . Sexual activity: Not Currently  Lifestyle  . Physical activity:  Days per week: Not on file    Minutes per session: Not on file  . Stress: Not on file  Relationships  . Social connections:    Talks on phone: Not on file    Gets together: Not on file    Attends religious service: Not on file    Active member of club or organization: Not on file    Attends meetings of clubs or organizations: Not on file    Relationship status: Not on file  . Intimate partner violence:    Fear of current or ex partner: Not on file    Emotionally abused: Not on file    Physically abused: Not on file    Forced sexual activity: Not on file  Other Topics Concern  . Not on file  Social History Narrative  . Not on file    REVIEW OF SYSTEMS: Constitutional: No fevers, chills, or sweats, no generalized fatigue, change in appetite Eyes: No visual changes, double vision, eye pain Ear, nose and throat: No hearing loss, ear pain, nasal congestion, sore throat Cardiovascular: No chest pain, palpitations Respiratory:  No shortness of breath at rest or with exertion, wheezes GastrointestinaI: No nausea, vomiting, diarrhea, abdominal pain, fecal incontinence Genitourinary:  No dysuria, urinary retention or frequency Musculoskeletal:  No neck pain, back pain Integumentary: No rash, pruritus, skin  lesions Neurological: as above Psychiatric: No depression, insomnia, anxiety Endocrine: No palpitations, fatigue, diaphoresis, mood swings, change in appetite, change in weight, increased thirst Hematologic/Lymphatic:  No anemia, purpura, petechiae. Allergic/Immunologic: no itchy/runny eyes, nasal congestion, recent allergic reactions, rashes  PHYSICAL EXAM: Vitals:   01/13/19 1136  BP: 112/66  Pulse: 90  SpO2: 97%   General: No acute distress, sitting on wheelchair Head:  Normocephalic/atraumatic Neck: supple, no paraspinal tenderness, full range of motion Heart:  Regular rate and rhythm Lungs:  Clear to auscultation bilaterally Back: No paraspinal tenderness Skin/Extremities: No rash, no edema Neurological Exam: alert and oriented to person, place, season. No aphasia or dysarthria. Fund of knowledge is reduced. Recent and remote memory are impaired.Attention and concentration are reduced, refused to spell WORLD or do serial 7s. Able to name objects, difficulty repeating.   MMSE - Mini Mental State Exam 01/13/2019 06/02/2018 06/08/2017  Not completed: - - Unable to complete  Orientation to time 1 0 -  Orientation to Place 4 3 -  Registration 3 3 -  Attention/ Calculation 0 0 -  Recall 0 0 -  Language- name 2 objects 2 2 -  Language- repeat 0 1 -  Language- follow 3 step command 3 3 -  Language- read & follow direction 1 1 -  Write a sentence 1 1 -  Copy design 0 0 -  Total score 15 14 -    Cranial nerves: Pupils equal, round, reactive to light.  Extraocular movements intact with no nystagmus. Visual fields full. Facial sensation intact. No facial asymmetry. Tongue, uvula, palate midline.  Motor: Cogwheeling bilaterally, muscle strength 5/5 throughout with no pronator drift.  Sensation to light touch intact.  No extinction to double simultaneous stimulation. Finger to nose testing intact.  Gait not tested, sitting on wheelchair.  IMPRESSION: This is an 85 yo RH woman with a  history of diabetes, hypertension, CAD, lung cancer, and moderate dementia with behavioral disturbance. Her daughter reports symptoms started with hallucinations, suggestive of Lewy body dementia. MRI brain did not show any acute changes, there was moderate to severe chronic microvascular disease. MMSE today 15/30. Continue Donepezil 10mg  daily.  She is on low dose Depakote ER 250mg  qhs started for hallucinations, which she has not been reporting recently. We have agreed to continue current medications without any changes today. Continue 24/7 care. She does not drive. She will follow-up in 6 months and knows to call for any changes  Thank you for allowing me to participate in her care.  Please do not hesitate to call for any questions or concerns.  The duration of this appointment visit was 30 minutes of face-to-face time with the patient.  Greater than 50% of this time was spent in counseling, explanation of diagnosis, planning of further management, and coordination of care.   Ellouise Newer, M.D.   CC: Dr. [name]

## 2019-01-18 ENCOUNTER — Encounter: Payer: Self-pay | Admitting: Radiation Oncology

## 2019-01-18 NOTE — Progress Notes (Addendum)
Thoracic Location of Tumor / Histology: Stage IA (T1b, N0, M0) non-small cell lung cancer consistent with adenocarcinoma involving the right lower lobe, diagnosed in September 2014.  She had repeat CT scan of the chest performed recently. Unfortunately her scan showed further increase in the size of the right hilar lymph nodes definitely concerning for disease recurrence.  There was also slight increase in the right lower lobe nodule.    Tobacco/Marijuana/Snuff/ETOH use: former smoker but quit in 1973  Past/Anticipated interventions by cardiothoracic surgery, if any: no  Past/Anticipated interventions by medical oncology, if any: Observation.  Signs/Symptoms  Weight changes, if any: No. Daughter reports the patient has an excellent appetite.   Respiratory complaints, if any: Reports shortness of breath with exertion. Reports an occasional dry cough. Denies pain associated with swallowing.  Hemoptysis, if any: no  Pain issues, if any:  no  SAFETY ISSUES:  Prior radiation? yes, SBRT through 10/29/2013 to 60 Gy in 5 fractions Pacemaker/ICD? no  Possible current pregnancy?no, 85 year old female Is the patient on methotrexate? no  Current Complaints / other details:  85 year old female. Daughter manages her medications and fiances because of her dementia. Patient does not drive. Patient ambulates with aid of a cane.

## 2019-01-19 ENCOUNTER — Encounter: Payer: Self-pay | Admitting: Radiation Oncology

## 2019-01-19 ENCOUNTER — Ambulatory Visit
Admission: RE | Admit: 2019-01-19 | Discharge: 2019-01-19 | Disposition: A | Discharge status: Home / Self Care | Payer: Medicare Other | Source: Ambulatory Visit | Attending: Radiation Oncology | Admitting: Radiation Oncology

## 2019-01-19 ENCOUNTER — Other Ambulatory Visit: Payer: Self-pay

## 2019-01-19 VITALS — BP 143/87 | HR 106 | Temp 97.8°F | Resp 18 | Wt 139.2 lb

## 2019-01-19 DIAGNOSIS — F0390 Unspecified dementia without behavioral disturbance: Secondary | ICD-10-CM | POA: Insufficient documentation

## 2019-01-19 DIAGNOSIS — Z923 Personal history of irradiation: Secondary | ICD-10-CM | POA: Diagnosis not present

## 2019-01-19 DIAGNOSIS — C3431 Malignant neoplasm of lower lobe, right bronchus or lung: Secondary | ICD-10-CM

## 2019-01-19 DIAGNOSIS — C3491 Malignant neoplasm of unspecified part of right bronchus or lung: Secondary | ICD-10-CM

## 2019-01-19 DIAGNOSIS — M1990 Unspecified osteoarthritis, unspecified site: Secondary | ICD-10-CM | POA: Diagnosis not present

## 2019-01-19 DIAGNOSIS — Z7984 Long term (current) use of oral hypoglycemic drugs: Secondary | ICD-10-CM | POA: Insufficient documentation

## 2019-01-19 DIAGNOSIS — Z7982 Long term (current) use of aspirin: Secondary | ICD-10-CM | POA: Diagnosis not present

## 2019-01-19 DIAGNOSIS — Z79899 Other long term (current) drug therapy: Secondary | ICD-10-CM | POA: Insufficient documentation

## 2019-01-19 DIAGNOSIS — Z87891 Personal history of nicotine dependence: Secondary | ICD-10-CM | POA: Diagnosis not present

## 2019-01-19 DIAGNOSIS — I2510 Atherosclerotic heart disease of native coronary artery without angina pectoris: Secondary | ICD-10-CM | POA: Diagnosis not present

## 2019-01-19 DIAGNOSIS — I110 Hypertensive heart disease with heart failure: Secondary | ICD-10-CM | POA: Insufficient documentation

## 2019-01-19 DIAGNOSIS — I509 Heart failure, unspecified: Secondary | ICD-10-CM | POA: Insufficient documentation

## 2019-01-19 DIAGNOSIS — E119 Type 2 diabetes mellitus without complications: Secondary | ICD-10-CM | POA: Diagnosis not present

## 2019-01-19 HISTORY — DX: Unspecified dementia, unspecified severity, without behavioral disturbance, psychotic disturbance, mood disturbance, and anxiety: F03.90

## 2019-01-19 HISTORY — DX: Malignant neoplasm of unspecified part of unspecified bronchus or lung: C34.90

## 2019-01-19 NOTE — Progress Notes (Signed)
Radiation Oncology         [phone] ________________________________  Initial Outpatient Consultation  Name: LELER BRION MRN: [id number]  Date of Service: 01/19/2019 DOB: 03-Jan-1927  CH:ENIDPO, Jori Moll, MD  [name], MD   REFERRING PHYSICIAN: [name], MD  DIAGNOSIS: 85 y.o. female with suspected recurrence of NSCLC with enlarging right hilar node(s) and RLL nodule.    ICD-10-CM   1. 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 female seen at the request of Dr. Julien Nordmann. She was initially diagnosed with Stage IA (T1b, N0, M0) non-small cell lung cancer, adenocarcinoma involving the right lower lobe in September 2014. She is status post curative stereotactic body radiotherapy to the right lower lobe lung nodule, completed in November 2014. She has been under observation with Dr. Julien Nordmann since that time. In July 2019, her chest CT showed no concerning findings for disease progression except for a mildly enlarged 15 mm right hilar lymph node.  She had repeat a CT scan of the chest performed on 01/05/2019. Unfortunately, this scan showed interval enlargement of the right hilar lymph node, now measuring 20 mm x 15 mm, concerning for lung cancer recurrence. There was also potential interval enlargement of right lower lobe pulmonary nodule, from 13 x 12 mm to now 14 x 12 mm. Additional smaller scattered nodules appeared unchanged.   The patient reviewed the imaging results with Dr. Julien Nordmann and has kindly been referred today for discussion of palliative radiation treatment to the enlarging hilar node and/or RLL nodule.   PREVIOUS RADIATION THERAPY: Yes - SBRT 10/20/2013, 10/22/2013, 10/25/2013, 10/27/2013, 10/29/2013: The 2.7 cm subsolid target in the right lower lung received 60 Gy in 5 fractions of 12 Gy.  PAST MEDICAL HISTORY:  Past Medical History:  Diagnosis Date  . Allergic  rhinitis   . Arthritis   . CAD (coronary artery disease)    mod 2V CAD (60% LAD, 70-80% mid CX) by 01/2008 cath  . CHF (congestive heart failure) ([place])   . Dementia ([place])   . Diabetes mellitus    dx 10 plus yrs ago  . Dilated cardiomyopathy ([place])    now resolved, EF 60-65% echo 10/11/11 (Dr. Marlou Porch)  . Diverticulosis   . Hyperlipidemia   . Hypertension   . Hypokalemia 01/09/2017  . Lung cancer ([place])   . Lung mass   . MR (mitral regurgitation)   . Osteopenia       PAST SURGICAL HISTORY: Past Surgical History:  Procedure Laterality Date  . ABDOMINAL HYSTERECTOMY    . APPENDECTOMY    . EYE SURGERY     bil cataracts  . KNEE ARTHROSCOPY     ?  left  knee  . TONSILLECTOMY      FAMILY HISTORY:  Family History  Problem Relation Age of Onset  . Colon cancer Mother   . Heart disease Brother     SOCIAL HISTORY:  Social History   Socioeconomic History  . Marital status: Widowed    Spouse name: Not on file  . Number of children: Not on file  . Years of education: Not on file  . Highest education level: Not on file  Occupational History  . Not on file  Social Needs  . Financial resource strain: Not on file  . Food insecurity:    Worry: Not on file    Inability: Not on file  . Transportation needs:  Medical: Not on file    Non-medical: Not on file  Tobacco Use  . Smoking status: Former Smoker    Packs/day: 0.50    Years: 5.00    Pack years: 2.50    Types: Cigarettes    Last attempt to quit: 06/15/1972    Years since quitting: 46.6  . Smokeless tobacco: Never Used  Substance and Sexual Activity  . Alcohol use: No  . Drug use: No  . Sexual activity: Not Currently  Lifestyle  . Physical activity:    Days per week: Not on file    Minutes per session: Not on file  . Stress: Not on file  Relationships  . Social connections:    Talks on phone: Not on file    Gets together: Not on file    Attends religious service: Not on file    Active member of club or  organization: Not on file    Attends meetings of clubs or organizations: Not on file    Relationship status: Not on file  . Intimate partner violence:    Fear of current or ex partner: Not on file    Emotionally abused: Not on file    Physically abused: Not on file    Forced sexual activity: Not on file  Other Topics Concern  . Not on file  Social History Narrative   Daughter resides with patient and is her primary care giver.    ALLERGIES: Moxifloxacin; Penicillins; and Prednisone  MEDICATIONS:  Current Outpatient Medications  Medication Sig Dispense Refill  . amLODipine (NORVASC) 5 MG tablet TAKE 1 TABLET BY MOUTH EVERYDAY AT BEDTIME 30 tablet 11  . aspirin 81 MG tablet Take 81 mg by mouth daily.    Marland Kitchen atorvastatin (LIPITOR) 40 MG tablet Take 40 mg by mouth daily at 6 PM.     . divalproex (DEPAKOTE ER) 250 MG 24 hr tablet Take 1 tablet every night 90 tablet 3  . donepezil (ARICEPT) 10 MG tablet TAKE 1 TABLET BY MOUTH EVERY DAY 90 tablet 3  . furosemide (LASIX) 40 MG tablet TAKE 1 TABLET (40 MG TOTAL) BY MOUTH EVERY OTHER DAY. 45 tablet 3  . glipiZIDE (GLUCOTROL) 5 MG tablet Take 2.5 mg by mouth daily.    Marland Kitchen levalbuterol (XOPENEX HFA) 45 MCG/ACT inhaler INHALE 2 PUFFS EVERY 4-6 HRS AS NEEDED  3  . metoprolol succinate (TOPROL-XL) 100 MG 24 hr tablet TAKE 1 TABLET (100 MG TOTAL) BY MOUTH DAILY. TAKE WITH OR IMMEDIATELY FOLLOWING A MEAL. 90 tablet 2  . mirtazapine (REMERON) 15 MG tablet Take 15 mg by mouth at bedtime.    . pantoprazole (PROTONIX) 40 MG tablet Take 40 mg by mouth daily.     . potassium chloride SA (K-DUR,KLOR-CON) 20 MEQ tablet Take 2 tablets (40 mEq total) by mouth daily. 20 tablet 0  . PROAIR HFA 108 (90 Base) MCG/ACT inhaler Inhale 2 puffs into the lungs every 6 (six) hours as needed for wheezing or shortness of breath.  6   No current facility-administered medications for this encounter.     REVIEW OF SYSTEMS:  On review of systems, the patient reports that she is  doing well overall. She denies any chest pain, fevers, chills, night sweats, appetite loss, or unintended weight changes. She reports shortness of breath with exertion related to CHF, improved and controlled on Lasix. She reports occasional dry cough and denies hemoptysis. She denies any bowel or bladder disturbances, and denies abdominal pain, nausea or vomiting. She denies  any new musculoskeletal or joint aches or pains. She is mostly sedentary and only occasionally gets up to walk. She has Lewy body dementia and lives with her daughter, [name], who is her primary caregiver. A complete review of systems is obtained and is otherwise negative.    PHYSICAL EXAM:  Wt Readings from Last 3 Encounters:  01/19/19 139 lb 4 oz (63.2 kg)  01/13/19 140 lb (63.5 kg)  01/07/19 140 lb 6.4 oz (63.7 kg)   Temp Readings from Last 3 Encounters:  01/19/19 97.8 F (36.6 C) (Oral)  01/07/19 98.2 F (36.8 C) (Oral)  07/02/18 98 F (36.7 C) (Oral)   BP Readings from Last 3 Encounters:  01/19/19 (!) 143/87  01/13/19 112/66  01/07/19 (!) 162/90   Pulse Readings from Last 3 Encounters:  01/19/19 (!) 106  01/13/19 90  01/07/19 92   Pain Assessment Pain Score: 0-No pain/10  In general this is a well appearing African-American female sitting in wheelchair, in no acute distress. She is alert and oriented x4 and appropriate throughout the examination. HEENT reveals that the patient is normocephalic, atraumatic. EOMs are intact. PERRLA. Skin is intact without any evidence of gross lesions. Cardiovascular exam reveals a regular rate and rhythm, no clicks rubs or murmurs are auscultated. Chest is clear to auscultation bilaterally. Lymphatic assessment is performed and does not reveal any adenopathy in the cervical, supraclavicular, axillary, or inguinal chains. Abdomen has active bowel sounds in all quadrants and is intact. The abdomen is soft, non tender, non distended. Lower extremities are negative for pretibial  pitting edema, deep calf tenderness, cyanosis or clubbing.   KPS = 80  100 - Normal; no complaints; no evidence of disease. 90   - Able to carry on normal activity; minor signs or symptoms of disease. 80   - Normal activity with effort; some signs or symptoms of disease. 21   - Cares for self; unable to carry on normal activity or to do active work. 60   - Requires occasional assistance, but is able to care for most of his personal needs. 50   - Requires considerable assistance and frequent medical care. 79   - Disabled; requires special care and assistance. 69   - Severely disabled; hospital admission is indicated although death not imminent. 47   - Very sick; hospital admission necessary; active supportive treatment necessary. 10   - Moribund; fatal processes progressing rapidly. 0     - Dead  Karnofsky DA, Abelmann Megargel, Craver LS and Burchenal [facility] [phone]) The use of the nitrogen mustards in the palliative treatment of carcinoma: with particular reference to bronchogenic carcinoma Cancer 1 634-56  LABORATORY DATA:  Lab Results  Component Value Date   WBC 8.0 01/05/2019   HGB 12.4 01/05/2019   HCT 38.5 01/05/2019   MCV 89.1 01/05/2019   PLT 192 01/05/2019   Lab Results  Component Value Date   NA 142 01/05/2019   K 3.7 01/05/2019   CL 106 01/05/2019   CO2 26 01/05/2019   Lab Results  Component Value Date   ALT 9 01/05/2019   AST 15 01/05/2019   ALKPHOS 98 01/05/2019   BILITOT 0.5 01/05/2019     RADIOGRAPHY: Ct Chest W Contrast  Result Date: 01/05/2019 CLINICAL DATA:  Lung cancer follow-up. Non-small cell lung cancer. Radiation therapy complete. EXAM: CT CHEST WITH CONTRAST TECHNIQUE: Multidetector CT imaging of the chest was performed during intravenous contrast administration. CONTRAST:  62mL OMNIPAQUE IOHEXOL 300 MG/ML  SOLN COMPARISON:  Coronary  artery calcification and aortic atherosclerotic calcification. FINDINGS: Cardiovascular: No axillary or supraclavicular  adenopathy. No mediastinal adenopathy. RIGHT infrahilar rounded node measures 20 mm by 15 mm (image 65/2) compares with 15 mm x 14 mm for measurable interval enlargement Large hiatal hernia posterior the heart Mediastinum/Nodes: Lungs/Pleura: Angular consolidation posterior to the RIGHT hilum is similar to prior. Dominant nodule in the RIGHT lower lobe lateral nodule measuring 14 x 12 mm (image 106/5) is measures slightly larger than 13 x 12 mm. SMall cluster of nodules in the medial aspect of the RIGHT middle lobe (image 78/5) are similar. Mild focus of ground-glass opacity in the RIGHT lower lobe are similar. The LEFT lung there is volume loss.  New or suspicious nodules. Upper Abdomen: Limited view of the liver, kidneys, pancreas are unremarkable. Normal adrenal glands. Musculoskeletal: No aggressive osseous lesion. IMPRESSION: 1. Interval enlargement of RIGHT hilar lymph node concerning for lung cancer recurrence. Consider FDG PET scan. 2. Angular consolidation posterior the RIGHT hila similar prior. 3. Potential interval enlargement of RIGHT lower lobe pulmonary nodule. Recommend close attention on follow-up. 4. Additional smaller scattered nodules are unchanged. Electronically Signed   By: Suzy Bouchard M.D.   On: 01/05/2019 14:44      IMPRESSION/PLAN: 1. 85 y.o. female with suspected recurrence of NSCLC with enlarging right hilar node(s) and RLL nodule.  Today, we talked to the patient and her daughter, [name], about the findings and workup thus far. We discussed the natural history of recurrent lung carcinoma and general treatment, highlighting the role of palliative radiotherapy in the management. We discussed the need for PET imaging to further evaluate the enlarging hilar node(s) and RLL nodule. We discussed the available radiation techniques, and focused on the details of logistics and delivery. We discussed that our final treatment recommendation will be dependant on findings on PET scan.  If  her disease appears to be upstaged to Stage III, we would need to give consideration for a longer course of daily radiotherapy with or without chemotherapy depending on her goals of care. We also discussed the potential need for biopsy of the enlarging hilar node unless this appears blatantly obvious for disease recurrence on PET, in which case, we would consider proceeding with treatment without tissue diagnosis.  We will plan to have her case and imaging presented at upcoming multidisciplinary lung conference for discussion and recommendations for treatment vs further evaluation.   We reviewed the anticipated acute and late sequelae associated with radiation in this setting. The patient and her daughter were encouraged to ask questions that were answered to their stated satisfaction.  We will share this information with Dr. Julien Nordmann and plan to see her back in the office following PET scan and [place] discussion to review results and recommendations.  She knows to call at any time with questions or concerns in the interim.    Nicholos Johns, PA-C    Tyler Pita, MD  [facility] Direct Dial: [phone]  Fax: [phone] [email or web address]  Skype  [email or web address]  This document serves as a record of services personally performed by Tyler Pita, MD and Freeman Caldron, PA-C. It was created on their behalf by Rae Lips, a trained medical scribe. The creation of this record is based on the scribe's personal observations and the providers' statements to them. This document has been checked and approved by the attending providers.

## 2019-01-19 NOTE — Progress Notes (Signed)
See progress note under physician encounter. 

## 2019-01-22 ENCOUNTER — Telehealth: Payer: Self-pay | Admitting: *Deleted

## 2019-01-22 NOTE — Telephone Encounter (Signed)
Called patient's daughter, [name] to inform of Pet Scan for 01-29-19 - arrival time- 3:30 pm , pt.to be NPO- 6 hrs. Prior to test, and patient to avoid carbs the day before, spoke with [name] and she is aware of this test.

## 2019-01-29 ENCOUNTER — Encounter (HOSPITAL_COMMUNITY): Payer: Medicare Other

## 2019-01-29 ENCOUNTER — Encounter (HOSPITAL_COMMUNITY)
Admission: RE | Admit: 2019-01-29 | Discharge: 2019-01-29 | Disposition: A | Discharge status: Home / Self Care | Payer: Medicare Other | Source: Ambulatory Visit | Attending: Urology | Admitting: Urology

## 2019-01-29 DIAGNOSIS — C3431 Malignant neoplasm of lower lobe, right bronchus or lung: Secondary | ICD-10-CM | POA: Diagnosis present

## 2019-01-29 LAB — GLUCOSE, CAPILLARY: GLUCOSE-CAPILLARY: 112 mg/dL — AB (ref 70–99)

## 2019-01-29 MED ORDER — FLUDEOXYGLUCOSE F - 18 (FDG) INJECTION
6.9000 | Freq: Once | INTRAVENOUS | Status: AC | PRN
  Administered 2019-01-29: 6.9 via INTRAVENOUS

## 2019-02-04 ENCOUNTER — Other Ambulatory Visit: Payer: Self-pay | Admitting: Urology

## 2019-02-04 ENCOUNTER — Ambulatory Visit: Payer: Medicare Other | Admitting: Cardiology

## 2019-02-04 ENCOUNTER — Telehealth: Payer: Self-pay | Admitting: Urology

## 2019-02-04 ENCOUNTER — Encounter: Payer: Self-pay | Admitting: Urology

## 2019-02-04 ENCOUNTER — Encounter: Payer: Self-pay | Admitting: Cardiology

## 2019-02-04 VITALS — BP 128/88 | HR 107 | Ht 65.0 in | Wt 148.4 lb

## 2019-02-04 DIAGNOSIS — I10 Essential (primary) hypertension: Secondary | ICD-10-CM | POA: Diagnosis not present

## 2019-02-04 DIAGNOSIS — R590 Localized enlarged lymph nodes: Secondary | ICD-10-CM

## 2019-02-04 DIAGNOSIS — C3491 Malignant neoplasm of unspecified part of right bronchus or lung: Secondary | ICD-10-CM

## 2019-02-04 NOTE — Progress Notes (Signed)
Per discussion at [facility] this morning, the patient will be scheduled for CT Biopsy of the hypermetabolic right cervical node seen on recent PET imaging and we will proceed with treatment planning thereafter based on findings.   Nicholos Johns, MMS, PA-C [place] [facility] [place]: [phone]  Fax: [phone]

## 2019-02-04 NOTE — Patient Instructions (Signed)
Medication Instructions:  Your physician recommends that you continue on your current medications as directed. Please refer to the Current Medication list given to you today.  If you need a refill on your cardiac medications before your next appointment, please call your pharmacy.   Lab work: None If you have labs (blood work) drawn today and your tests are completely normal, you will receive your results only by: Marland Kitchen MyChart Message (if you have MyChart) OR . A paper copy in the mail If you have any lab test that is abnormal or we need to change your treatment, we will call you to review the results.  Testing/Procedures: None  Follow-Up: At [facility], you and your health needs are our priority.  As part of our continuing mission to provide you with exceptional heart care, we have created designated Provider Care Teams.  These Care Teams include your primary Cardiologist (physician) and Advanced Practice Providers (APPs -  Physician Assistants and Nurse Practitioners) who all work together to provide you with the care you need, when you need it. You will need a follow up appointment in 3 months.  Please call our office 2 months in advance to schedule this appointment.  You may see Candee Furbish, MD or one of the following Advanced Practice Providers on your designated Care Team:   Truitt Merle, NP Cecilie Kicks, NP . Kathyrn Drown, NP  Any Other Special Instructions Will Be Listed Below (If Applicable).

## 2019-02-04 NOTE — Progress Notes (Signed)
[facility]. [address], [place]  [address] Phone: [phone] Fax:  [phone]  Date:  02/04/2019   ID:  Joan Hunter, DOB Oct 20, 1927, MRN [id number]  PCP:  [name], MD   History of Present Illness: Joan Hunter is a 85 y.o. female with moderate coronary artery disease prior catheterization in 2009, prior idiopathic cardiomyopathy (EF was 15%) now resolved 55%, mitral regurgitation mild here for followup.  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 Dr. Tammi Klippel. Radiation only.   Dr. Melvyn Novas changed Coreg to Bystolic to bisoprolol, but she is now back on Toprol. COPD/bronchitis. Treating hiatal hernia with Protonix as well.   Continuing with atorvastatin, hyperlipidemia.  She experienced some dizziness when standing and her medications were previously reduced. This has improved.  Her daughter, [name], helps her out significantly but she also make sure that she gets up and moves around adequately.   I encouraged her to use her walker. She has a split-level house. Concerned about stair use.   04/16/17-she has been having some more deconditioning, or shortness of breath with activity. Her daughter still has to motivate her to get up she states. She denies any chest pain. No orthopnea. No edema. No increase in weight.   01/13/17-mild shortness of breath with activity, no significant change.  She is trying to maintain her weight.  Mild shortness of breath with activity, she is trying to maintain her weight.  No significant change.  Her heart rate seems to be increased today.  Her daughter does not think that she has been taking her bisoprolol or amlodipine.  Her blood pressure is quite elevated as well. Heart rate 116 sinus tach  04/21/18 -still having some sinus tachycardia despite metoprolol 50.  We are increasing to 100.  She has had some wheezing when walking.  Mild cough.   Has not seen Dr. Melvyn Novas in quite some time.  She is seeing Dr. [name].  No syncope, no orthopnea, no PND, no bleeding.  08/04/2018-no chest pain fevers chills nausea vomiting syncope bleeding.  Still encouraging motion, movement.  Feels well.  11/03/2018-chief complaint follow-up for shortness of breath prior cardiomyopathy.  Overall she been doing quite well.  Still ambulating with walker.  Here with her daughter.  No chest pain fevers chills nausea vomiting syncope.  Weight has been fairly stable.  02/04/2019-here for the follow-up of coronary artery disease prior cardiomyopathy, hyperlipidemia.  No major changes today.  She has a birthday to celebrate in 4 days.  89th.  She sometimes states that she is confused in the morning.  She did go to the oncologist recently, slight increase in lung mass.  PET scan results pending.  Overall no change in symptoms.  Ankles slightly edematous.  Left greater than right   Wt Readings from Last 3 Encounters:  02/04/19 148 lb 6.4 oz (67.3 kg)  01/19/19 139 lb 4 oz (63.2 kg)  01/13/19 140 lb (63.5 kg)     Past Medical History:  Diagnosis Date  . Allergic rhinitis   . Arthritis   . CAD (coronary artery disease)    mod 2V CAD (60% LAD, 70-80% mid CX) by 01/2008 cath  . CHF (congestive heart failure) ([place])   . Dementia ([facility])   . Diabetes mellitus    dx 10 plus yrs ago  . Dilated cardiomyopathy ([place])    now resolved, EF 60-65% echo 10/11/11 (Dr.  Lundon Rosier)  . Diverticulosis   . Hyperlipidemia   . Hypertension   . Hypokalemia 01/09/2017  . Lung cancer ([place])   . Lung mass   . MR (mitral regurgitation)   . Osteopenia     Past Surgical History:  Procedure Laterality Date  . ABDOMINAL HYSTERECTOMY    . APPENDECTOMY    . EYE SURGERY     bil cataracts  . KNEE ARTHROSCOPY     ?  left  knee  . TONSILLECTOMY      Current Outpatient Medications  Medication Sig Dispense Refill  . amLODipine (NORVASC) 5 MG tablet TAKE 1 TABLET BY MOUTH EVERYDAY AT BEDTIME  30 tablet 11  . aspirin 81 MG tablet Take 81 mg by mouth daily.    Marland Kitchen atorvastatin (LIPITOR) 40 MG tablet Take 40 mg by mouth daily at 6 PM.     . divalproex (DEPAKOTE ER) 250 MG 24 hr tablet Take 1 tablet every night 90 tablet 3  . donepezil (ARICEPT) 10 MG tablet TAKE 1 TABLET BY MOUTH EVERY DAY 90 tablet 3  . furosemide (LASIX) 40 MG tablet TAKE 1 TABLET (40 MG TOTAL) BY MOUTH EVERY OTHER DAY. 45 tablet 3  . glipiZIDE (GLUCOTROL) 5 MG tablet Take 2.5 mg by mouth daily.    Marland Kitchen levalbuterol (XOPENEX HFA) 45 MCG/ACT inhaler INHALE 2 PUFFS EVERY 4-6 HRS AS NEEDED  3  . metoprolol succinate (TOPROL-XL) 100 MG 24 hr tablet TAKE 1 TABLET (100 MG TOTAL) BY MOUTH DAILY. TAKE WITH OR IMMEDIATELY FOLLOWING A MEAL. 90 tablet 2  . mirtazapine (REMERON) 15 MG tablet Take 15 mg by mouth at bedtime.    . pantoprazole (PROTONIX) 40 MG tablet Take 40 mg by mouth daily.     . potassium chloride SA (K-DUR,KLOR-CON) 20 MEQ tablet Take 2 tablets (40 mEq total) by mouth daily. 20 tablet 0  . PROAIR HFA 108 (90 Base) MCG/ACT inhaler Inhale 2 puffs into the lungs every 6 (six) hours as needed for wheezing or shortness of breath.  6   No current facility-administered medications for this visit.     Allergies:    Allergies  Allergen Reactions  . Moxifloxacin     REACTION: hives  . Penicillins     REACTION: hives  . Prednisone     REACTION: hives    Social History:  The patient  reports that she quit smoking about 46 years ago. Her smoking use included cigarettes. She has a 2.50 pack-year smoking history. She has never used smokeless tobacco. She reports that she does not drink alcohol or use drugs.   ROS:  Unless specified above all other negative  PHYSICAL EXAM: VS:  BP 128/88   Pulse (!) 107   Ht 5\' 5"  (1.651 m)   Wt 148 lb 6.4 oz (67.3 kg)   SpO2 94%   BMI 24.70 kg/m  GEN: Well nourished, well developed, in no acute distress  HEENT: normal  Neck: no JVD, carotid bruits, or masses Cardiac: RRR; 1/6  SM, no rubs, or gallops, minimal left greater than right ankle edema  Respiratory:  clear to auscultation bilaterally, normal work of breathing GI: soft, nontender, nondistended, + BS MS: no deformity or atrophy  Skin: warm and dry, no rash Neuro:  Alert and Oriented x 3, Strength and sensation are intact Psych: euthymic mood, full affect   EKG: 02/04/19 - sinus tachycardia rate 107 no other abnormalities compared to prior.  Personally viewed 01/13/18 shows sinus tachycardia 116 with occasional  PAC, left anterior fascicular block, LVH.  Personally reviewed.  04/16/17 shows sinus tachycardia rate 104 with left anterior fascicular block, nonspecific ST-T wave changes. Personally viewed 04/01/16-sinus rhythm, 88, left anterior fascicular block, borderline LVH, nonspecific ST-T wave changes personally viewed-no significant change from prior-12/02/13-will rhythm, sinus arrhythmia, left axis deviation/left anterior fascicular block, LVH, nonspecific ST changes, heart rate 73  ECHO 04/2017: - Left ventricle: The cavity size was normal. Systolic function was   normal. The estimated ejection fraction was in the range of 55%   to 60%. Wall motion was normal; there were no regional wall   motion abnormalities. Doppler parameters are consistent with   abnormal left ventricular relaxation (grade 1 diastolic   dysfunction).    ECHO 2012:   1. Normal LV size and function. 2. There were no regional wall motion abnormalities. 3. Left ventricular ejection fraction estimated by 2D at 60-65 percent. 4. Mild mitral valve regurgitation. 5. Trivial tricuspid regurgitation.  Labs: Albumin 3.4, hemoglobin 12.5, creatinine 1.0  CT scan on 12/26/16-no recurrence of lung cancer. Aortic atherosclerosis and coronary artery calcification noted. Pulmonary fibrotic changes noted.   ASSESSMENT AND PLAN:   History of dilated cardiomyopathy  - Ejection fraction previously 15%, now normal at 65%. Excellent.  Back on Toprol  because of tachycardia.  Doing well on Toprol 100.  Once again medications reviewed, stable doing well.  Non-small cell cancer of right lung, stage IA  - Radiation only   - Dr. Lovenia Kim signs of disease recurrence. Reviewed his office note from 01/09/18.  Excellent.  Notes reviewed.  Continues to have this monitored.  Pulmonary fibrosis   -  Dr. Melvyn Novas   She has not seen him in quite some time.  No recent exacerbations or issues.  Mitral regurgitation   - After cardiomyopathy resolved, mitral regurgitation also now mild.  Doing well, functional MR previously, no changes.  Continue to monitor, doing well currently with no shortness of breath.  Left anterior fascicular block   - Conduction disorder. No changes, no syncope.  If this were to deteriorate, pacemaker may be warranted in the future.  For now she is doing great.  Essential hypertension  -Continue with Toprol-XL 100 mg.  Heart rate improved bisoprolol was discontinued.  Pulse has been about 90-100 previously.  Aortic atherosclerosis  - Continue with secondary prevention.  No changes.  Continue with secondary risk factor prevention.  Continue with good blood pressure control.  3 month follow up again. (Daughter requests).   Signed, Candee Furbish, MD [facility]  02/04/2019 10:19 AM

## 2019-02-04 NOTE — Telephone Encounter (Signed)
I spoke with the patient's daughter, Larena Glassman, to inform of recommendation to proceed with CT Bx of right cervical node for tissue confirmation based on recent findings on her PET scan.  She is in agreement and the bx is scheduled for 02/12/19. I advised that we would plan to meet back together once we have the results from her bx to discuss results and further tx recommendations.  She is comfortable with this plan.  Nicholos Johns, MMS, PA-C [facility]: [phone]  Fax: [phone]

## 2019-02-05 ENCOUNTER — Telehealth: Payer: Self-pay | Admitting: *Deleted

## 2019-02-05 NOTE — Telephone Encounter (Signed)
CALLED PATIENT'S DAUGHTER- [name] TO INFORM OF CT BIOPSY ON 02/12/19 AND HER FU ON 02/18/19 @ 1:30 PM FOR RESULTS WITH ASHLYN BRUNING, SPOKE WITH [name] AND SHE IS AWARE OF THESE APPTS. AND IS GOOD WITH THEM

## 2019-02-09 ENCOUNTER — Telehealth: Payer: Self-pay | Admitting: *Deleted

## 2019-02-09 NOTE — Telephone Encounter (Signed)
Called patient to inform of fu being moved from 02-18-19 to 02/16/19 @ 10:30 am with Ashlyn Bruning, lvm for a return call

## 2019-02-11 ENCOUNTER — Other Ambulatory Visit: Payer: Self-pay | Admitting: Radiology

## 2019-02-12 ENCOUNTER — Encounter (HOSPITAL_COMMUNITY): Payer: Self-pay

## 2019-02-12 ENCOUNTER — Other Ambulatory Visit: Payer: Self-pay

## 2019-02-12 ENCOUNTER — Ambulatory Visit (HOSPITAL_COMMUNITY)
Admission: RE | Admit: 2019-02-12 | Discharge: 2019-02-12 | Disposition: A | Discharge status: Home / Self Care | Payer: Medicare Other | Source: Ambulatory Visit | Attending: Urology | Admitting: Urology

## 2019-02-12 DIAGNOSIS — F0390 Unspecified dementia without behavioral disturbance: Secondary | ICD-10-CM | POA: Insufficient documentation

## 2019-02-12 DIAGNOSIS — I110 Hypertensive heart disease with heart failure: Secondary | ICD-10-CM | POA: Insufficient documentation

## 2019-02-12 DIAGNOSIS — Z880 Allergy status to penicillin: Secondary | ICD-10-CM | POA: Insufficient documentation

## 2019-02-12 DIAGNOSIS — C3491 Malignant neoplasm of unspecified part of right bronchus or lung: Secondary | ICD-10-CM | POA: Diagnosis not present

## 2019-02-12 DIAGNOSIS — Z7982 Long term (current) use of aspirin: Secondary | ICD-10-CM | POA: Insufficient documentation

## 2019-02-12 DIAGNOSIS — I509 Heart failure, unspecified: Secondary | ICD-10-CM | POA: Insufficient documentation

## 2019-02-12 DIAGNOSIS — Z79899 Other long term (current) drug therapy: Secondary | ICD-10-CM | POA: Diagnosis not present

## 2019-02-12 DIAGNOSIS — R590 Localized enlarged lymph nodes: Secondary | ICD-10-CM | POA: Diagnosis present

## 2019-02-12 DIAGNOSIS — E119 Type 2 diabetes mellitus without complications: Secondary | ICD-10-CM | POA: Diagnosis not present

## 2019-02-12 DIAGNOSIS — I2510 Atherosclerotic heart disease of native coronary artery without angina pectoris: Secondary | ICD-10-CM | POA: Insufficient documentation

## 2019-02-12 DIAGNOSIS — E876 Hypokalemia: Secondary | ICD-10-CM | POA: Insufficient documentation

## 2019-02-12 DIAGNOSIS — E785 Hyperlipidemia, unspecified: Secondary | ICD-10-CM | POA: Insufficient documentation

## 2019-02-12 DIAGNOSIS — Z888 Allergy status to other drugs, medicaments and biological substances status: Secondary | ICD-10-CM | POA: Diagnosis not present

## 2019-02-12 DIAGNOSIS — Z7984 Long term (current) use of oral hypoglycemic drugs: Secondary | ICD-10-CM | POA: Insufficient documentation

## 2019-02-12 DIAGNOSIS — D110 Benign neoplasm of parotid gland: Secondary | ICD-10-CM | POA: Diagnosis not present

## 2019-02-12 LAB — CBC
HCT: 37.9 % (ref 36.0–46.0)
Hemoglobin: 11.8 g/dL — ABNORMAL LOW (ref 12.0–15.0)
MCH: 28.9 pg (ref 26.0–34.0)
MCHC: 31.1 g/dL (ref 30.0–36.0)
MCV: 92.7 fL (ref 80.0–100.0)
Platelets: 204 10*3/uL (ref 150–400)
RBC: 4.09 MIL/uL (ref 3.87–5.11)
RDW: 14.9 % (ref 11.5–15.5)
WBC: 7.1 10*3/uL (ref 4.0–10.5)
nRBC: 0 % (ref 0.0–0.2)

## 2019-02-12 LAB — PROTIME-INR
INR: 0.9 (ref 0.8–1.2)
Prothrombin Time: 12.3 seconds (ref 11.4–15.2)

## 2019-02-12 LAB — GLUCOSE, CAPILLARY: Glucose-Capillary: 122 mg/dL — ABNORMAL HIGH (ref 70–99)

## 2019-02-12 MED ORDER — LIDOCAINE HCL 1 % IJ SOLN
INTRAMUSCULAR | Status: AC
  Filled 2019-02-12: qty 20

## 2019-02-12 MED ORDER — MIDAZOLAM HCL 2 MG/2ML IJ SOLN
INTRAMUSCULAR | Status: AC | PRN
  Administered 2019-02-12: 0.5 mg via INTRAVENOUS

## 2019-02-12 MED ORDER — MIDAZOLAM HCL 2 MG/2ML IJ SOLN
INTRAMUSCULAR | Status: AC
  Filled 2019-02-12: qty 2

## 2019-02-12 MED ORDER — FENTANYL CITRATE (PF) 100 MCG/2ML IJ SOLN
INTRAMUSCULAR | Status: AC
  Filled 2019-02-12: qty 2

## 2019-02-12 MED ORDER — SODIUM CHLORIDE 0.9 % IV SOLN
INTRAVENOUS | Status: DC
  Administered 2019-02-12: 12:00:00 via INTRAVENOUS

## 2019-02-12 MED ORDER — FENTANYL CITRATE (PF) 100 MCG/2ML IJ SOLN
INTRAMUSCULAR | Status: AC | PRN
  Administered 2019-02-12: 25 ug via INTRAVENOUS

## 2019-02-12 MED ORDER — LIDOCAINE HCL (PF) 1 % IJ SOLN
INTRAMUSCULAR | Status: AC | PRN
  Administered 2019-02-12: 3 mL via SUBCUTANEOUS

## 2019-02-12 NOTE — H&P (Signed)
Referring Physician(s): Manning,M  Supervising Physician: Jacqulynn Cadet  Patient Status:  [facility] OP  Chief Complaint: "I'm having a biopsy"   Subjective: Patient familiar to IR service from prior right lower lobe lung nodule biopsies in 2013 and 2014.  She has a history of stage IA non-small cell lung cancer consistent with adenocarcinoma involving the right lower lobe initially diagnosed in 2014.  She underwent curative stereotactic radiotherapy to the right lower lobe lung nodule in 2014.  Currently under observation.  Recent PET scan has revealed:  1. Hypermetabolic right lower and middle lobe pulmonary nodules associated with hypermetabolic right hilar lymphadenopathy. 2. Bandlike opacity along the minor fissure is hypermetabolic and could represent pleural disease or atelectasis. 3. Hypermetabolic right cervical lymph node consistent with metastatic disease. 4. Hypermetabolic uptake identified inferior right scapula without associated bony abnormality by CT. Metastatic involvement a concern.  She presents today for ultrasound-guided right neck lymph node biopsy for further evaluation.  She denies fever, headache, chest pain, abdominal pain, back pain, nausea, vomiting or bleeding.  She does have some dyspnea with exertion and occasional cough.  Past Medical History:  Diagnosis Date  . Allergic rhinitis   . Arthritis   . CAD (coronary artery disease)    mod 2V CAD (60% LAD, 70-80% mid CX) by 01/2008 cath  . CHF (congestive heart failure) ([facility])   . Dementia ([place])   . Diabetes mellitus    dx 10 plus yrs ago  . Dilated cardiomyopathy ([place])    now resolved, EF 60-65% echo 10/11/11 (Dr. Marlou Porch)  . Diverticulosis   . Hyperlipidemia   . Hypertension   . Hypokalemia 01/09/2017  . Lung cancer ([facility])   . Lung mass   . MR (mitral regurgitation)   . Osteopenia    Past Surgical History:  Procedure Laterality Date  . ABDOMINAL HYSTERECTOMY    . APPENDECTOMY    . EYE SURGERY      bil cataracts  . KNEE ARTHROSCOPY     ?  left  knee  . TONSILLECTOMY        Allergies: Moxifloxacin; Penicillins; and Prednisone  Medications: Prior to Admission medications   Medication Sig Start Date End Date Taking? Authorizing Provider  amLODipine (NORVASC) 5 MG tablet TAKE 1 TABLET BY MOUTH EVERYDAY AT BEDTIME 09/28/18  Yes Jerline Pain, MD  aspirin 81 MG tablet Take 81 mg by mouth daily.   Yes [provider]  atorvastatin (LIPITOR) 40 MG tablet Take 40 mg by mouth daily at 6 PM.  11/13/15  Yes [provider]  divalproex (DEPAKOTE ER) 250 MG 24 hr tablet Take 1 tablet every night 01/13/19  Yes Cameron Sprang, MD  donepezil (ARICEPT) 10 MG tablet TAKE 1 TABLET BY MOUTH EVERY DAY 01/13/19  Yes Cameron Sprang, MD  furosemide (LASIX) 40 MG tablet TAKE 1 TABLET (40 MG TOTAL) BY MOUTH EVERY OTHER DAY. 11/04/18  Yes Jerline Pain, MD  glipiZIDE (GLUCOTROL) 5 MG tablet Take 2.5 mg by mouth daily.   Yes [provider]  levalbuterol (XOPENEX HFA) 45 MCG/ACT inhaler INHALE 2 PUFFS EVERY 4-6 HRS AS NEEDED 05/21/18  Yes [provider]  metoprolol succinate (TOPROL-XL) 100 MG 24 hr tablet TAKE 1 TABLET (100 MG TOTAL) BY MOUTH DAILY. TAKE WITH OR IMMEDIATELY FOLLOWING A MEAL. 07/28/18  Yes Jerline Pain, MD  mirtazapine (REMERON) 15 MG tablet Take 15 mg by mouth at bedtime.   Yes [provider]  pantoprazole (PROTONIX) 40  MG tablet Take 40 mg by mouth daily.  08/01/15  Yes [provider]  potassium chloride SA (K-DUR,KLOR-CON) 20 MEQ tablet Take 2 tablets (40 mEq total) by mouth daily. 01/09/17  Yes [name], MD  PROAIR HFA 108 [phone] Base) MCG/ACT inhaler Inhale 2 puffs into the lungs every 6 (six) hours as needed for wheezing or shortness of breath. 02/27/17  Yes [provider]     Vital Signs: BP (!) 153/91   Pulse (!) 113   Temp 98.3 F (36.8 C) (Oral)   Resp 16   Ht 5\' 5"  (1.651 m)   Wt 147 lb 11.3 oz (67  kg)   SpO2 98%   BMI 24.58 kg/m   Physical Exam awake, alert.  Chest clear to auscultation bilaterally.  Heart with slightly tachycardic but regular rhythm, soft murmur.  Abdomen soft, positive bowel sounds, nontender.  No significant lower extremity edema.  Imaging: No results found.  Labs:  CBC: Recent Labs    06/30/18 1103 01/05/19 1103 02/12/19 1148  WBC 7.7 8.0 7.1  HGB 12.6 12.4 11.8*  HCT 38.1 38.5 37.9  PLT 198 192 204    COAGS: Recent Labs    02/12/19 1148  INR 0.9    BMP: Recent Labs    06/30/18 1103 01/05/19 1103  NA 144 142  K 3.7 3.7  CL 104 106  CO2 29 26  GLUCOSE 153* 138*  BUN 29* 23  CALCIUM 9.3 9.1  CREATININE 1.24* 1.34*  GFRNONAA 37* 35*  GFRAA 43* 41*    LIVER FUNCTION TESTS: Recent Labs    06/30/18 1103 01/05/19 1103  BILITOT 0.4 0.5  AST 18 15  ALT 12 9  ALKPHOS 92 98  PROT 7.9 7.7  ALBUMIN 3.6 3.5    Assessment and Plan: Pt with history of stage IA non-small cell lung cancer consistent with adenocarcinoma involving the right lower lobe initially diagnosed in 2014.  She underwent curative stereotactic radiotherapy to the right lower lobe lung nodule in 2014.  Currently under observation.  Recent PET scan has revealed:  1. Hypermetabolic right lower and middle lobe pulmonary nodules associated with hypermetabolic right hilar lymphadenopathy. 2. Bandlike opacity along the minor fissure is hypermetabolic and could represent pleural disease or atelectasis. 3. Hypermetabolic right cervical lymph node consistent with metastatic disease. 4. Hypermetabolic uptake identified inferior right scapula without associated bony abnormality by CT. Metastatic involvement a concern.  She presents today for ultrasound-guided right neck lymph node biopsy for further evaluation.Risks and benefits of procedure was discussed with the patient and/or patient's family including, but not limited to bleeding, infection, damage to adjacent structures  or low yield requiring additional tests.  All of the questions were answered and there is agreement to proceed.  Consent signed and in chart.     Electronically Signed: D. Rowe Robert, PA-C 02/12/2019, 12:30 PM   I spent a total of 25 minutes at the the patient's bedside AND on the patient's hospital floor or unit, greater than 50% of which was counseling/coordinating care for image guided right neck lymph node biopsy.

## 2019-02-12 NOTE — Discharge Instructions (Signed)
Needle Biopsy, Care After °These instructions tell you how to care for yourself after your procedure. Your doctor may also give you more specific instructions. Call your doctor if you have any problems or questions. °What can I expect after the procedure? °After the procedure, it is common to have: °· Soreness. °· Bruising. °· Mild pain. °Follow these instructions at home: ° °· Return to your normal activities as told by your doctor. Ask your doctor what activities are safe for you. °· Take over-the-counter and prescription medicines only as told by your doctor. °· Wash your hands with soap and water before you change your bandage (dressing). If you cannot use soap and water, use hand sanitizer. °· Follow instructions from your doctor about: °? How to take care of your puncture site. °? When and how to change your bandage. °? When to remove your bandage. °· Check your puncture site every day for signs of infection. Watch for: °? Redness, swelling, or pain. °? Fluid or blood.  °? Pus or a bad smell. °? Warmth. °· Do not take baths, swim, or use a hot tub until your doctor approves. Ask your doctor if you may take showers. You may only be allowed to take sponge baths. °· Keep all follow-up visits as told by your doctor. This is important. °Contact a doctor if you have: °· A fever. °· Redness, swelling, or pain at the puncture site, and it lasts longer than a few days. °· Fluid, blood, or pus coming from the puncture site. °· Warmth coming from the puncture site. °Get help right away if: °· You have a lot of bleeding from the puncture site. °Summary °· After the procedure, it is common to have soreness, bruising, or mild pain at the puncture site. °· Check your puncture site every day for signs of infection, such as redness, swelling, or pain. °· Get help right away if you have severe bleeding from your puncture site. °This information is not intended to replace advice given to you by your health care provider. Make  sure you discuss any questions you have with your health care provider. °Document Released: 11/14/2008 Document Revised: 12/15/2017 Document Reviewed: 12/15/2017 °Elsevier Interactive Patient Education © 2019 [employer] °Moderate Conscious Sedation, Adult, Care After °These instructions provide you with information about caring for yourself after your procedure. Your health care provider may also give you more specific instructions. Your treatment has been planned according to current medical practices, but problems sometimes occur. Call your health care provider if you have any problems or questions after your procedure. °What can I expect after the procedure? °After your procedure, it is common: °· To feel sleepy for several hours. °· To feel clumsy and have poor balance for several hours. °· To have poor judgment for several hours. °· To vomit if you eat too soon. °Follow these instructions at home: °For at least 24 hours after the procedure: ° °· Do not: °? Participate in activities where you could fall or become injured. °? Drive. °? Use heavy machinery. °? Drink alcohol. °? Take sleeping pills or medicines that cause drowsiness. °? Make important decisions or sign legal documents. °? Take care of children on your own. °· Rest. °Eating and drinking °· Follow the diet recommended by your health care provider. °· If you vomit: °? Drink water, juice, or soup when you can drink without vomiting. °? Make sure you have little or no nausea before eating solid foods. °General instructions °· Have a responsible adult stay   with you until you are awake and alert. °· Take over-the-counter and prescription medicines only as told by your health care provider. °· If you smoke, do not smoke without supervision. °· Keep all follow-up visits as told by your health care provider. This is important. °Contact a health care provider if: °· You keep feeling nauseous or you keep vomiting. °· You feel light-headed. °· You develop a  rash. °· You have a fever. °Get help right away if: °· You have trouble breathing. °This information is not intended to replace advice given to you by your health care provider. Make sure you discuss any questions you have with your health care provider. °Document Released: 09/22/2013 Document Revised: 05/06/2016 Document Reviewed: 03/23/2016 °Elsevier Interactive Patient Education © 2019 [employer] ° °

## 2019-02-12 NOTE — Procedures (Signed)
Interventional Radiology Procedure Note  Procedure: US guided core biopsy right parotid gland mass  Complications: None  Estimated Blood Loss: None  Recommendations: - DC home  Signed,  Criselda Peaches, MD

## 2019-02-16 ENCOUNTER — Other Ambulatory Visit: Payer: Self-pay

## 2019-02-16 ENCOUNTER — Encounter: Payer: Self-pay | Admitting: Urology

## 2019-02-16 ENCOUNTER — Ambulatory Visit
Admission: RE | Admit: 2019-02-16 | Discharge: 2019-02-16 | Disposition: A | Discharge status: Home / Self Care | Payer: Medicare Other | Source: Ambulatory Visit | Attending: Urology | Admitting: Urology

## 2019-02-16 VITALS — BP 158/83 | HR 86 | Temp 97.6°F | Resp 18 | Ht 65.0 in | Wt 138.2 lb

## 2019-02-16 DIAGNOSIS — C3491 Malignant neoplasm of unspecified part of right bronchus or lung: Secondary | ICD-10-CM | POA: Diagnosis present

## 2019-02-16 DIAGNOSIS — Z79899 Other long term (current) drug therapy: Secondary | ICD-10-CM | POA: Diagnosis not present

## 2019-02-16 DIAGNOSIS — Z923 Personal history of irradiation: Secondary | ICD-10-CM | POA: Insufficient documentation

## 2019-02-16 DIAGNOSIS — G3183 Dementia with Lewy bodies: Secondary | ICD-10-CM | POA: Insufficient documentation

## 2019-02-16 DIAGNOSIS — F0280 Dementia in other diseases classified elsewhere without behavioral disturbance: Secondary | ICD-10-CM | POA: Insufficient documentation

## 2019-02-16 DIAGNOSIS — Z7982 Long term (current) use of aspirin: Secondary | ICD-10-CM | POA: Insufficient documentation

## 2019-02-16 DIAGNOSIS — Z7984 Long term (current) use of oral hypoglycemic drugs: Secondary | ICD-10-CM | POA: Insufficient documentation

## 2019-02-16 NOTE — Addendum Note (Signed)
Encounter addended by: Freeman Caldron, PA-C on: 02/16/2019 5:42 PM  Actions taken: Clinical Note Signed

## 2019-02-16 NOTE — Progress Notes (Addendum)
Radiation Oncology         [phone] ________________________________  Name: Joan Hunter MRN: [id number]  Date: 02/16/2019  DOB: [birth year]  Outpatient re-evaluation Note  CC: [name], MD  [name], MD  Diagnosis:   85 y.o. female with suspected recurrence of NSCLC with enlarging right hilar node(s) and hypermetabolic RML and RLL nodules.  Interval Since Last Radiation: 5 years and 3.5 months  10/20/2013 - 10/29/2013: The 2.7 cm subsolid target in the right lower lung received 60 Gy in 5 fractions of 12 Gy (SBRT)  Narrative:  The patient returns today for re-evaluation. In summary, she was initially diagnosed with Stage IA (T1b, N0, M0) non-small cell lung cancer, adenocarcinoma involving the right lower lobe in September 2014. She is status post curative stereotactic body radiotherapy to the right lower lobe lung nodule, completed in November 2014. She has been under observation with Dr. Julien Nordmann since that time. In July 2019, her chest CT showed no concerning findings for disease progression except for a mildly enlarged 15 mm right hilar lymph node.  She had repeat a CT scan of the chest performed on 01/05/2019.Unfortunately, this scan showed interval enlargement of the right hilar lymph node, now measuring 20 mm x 15 mm, concerning for lung cancer recurrence. There was also potential interval enlargement of right lower lobe pulmonary nodule, from 13 x 12 mm to now 14 x 12 mm. Additional smaller scattered nodules appeared unchanged.  She proceeded with a PET scan 01/29/19 for further evaluation and this confirmed hypermetabolic right lower and middle lobe pulmonary nodules associated with hypermetabolic right hilar lymphadenopathy.  Additionally, there was a hypermetabolic right cervical lymph node/parotid node suspicious for metastatic disease. Her case was presented at multidisciplinary thoracic conference and the consensus recommendation was to proceed with US guided  biopsy of the parotid node/gland.  This was performed on 02/12/19 and final pathology revealed a benign Warthin's tumor only.    She returns today to review recent pathology and for further discussion of treatment options. She is accompanied by her daughter.    On review of systems, the patient states feeling well overall. Her daughter reports the patient has a cough but that she denies productive cough, hemoptysis or pain with coughing. She has not noted any increased shortness of breath and denies chest pain. She has not had recent fevers, chills, night sweats or unintended weight loss. She denies any bowel or bladder disturbances, and denies abdominal pain, nausea or vomiting. She denies any new musculoskeletal or joint aches or pains. She is mostly sedentary and only occasionally gets up to walk. She has Lewy body dementia and lives with her daughter, Joan Hunter, who is her primary caregiver. A complete review of systems is obtained and is otherwise negative.  ALLERGIES:  is allergic to moxifloxacin; penicillins; and prednisone.  Meds: Current Outpatient Medications  Medication Sig Dispense Refill  . amLODipine (NORVASC) 5 MG tablet TAKE 1 TABLET BY MOUTH EVERYDAY AT BEDTIME 30 tablet 11  . aspirin 81 MG tablet Take 81 mg by mouth daily.    Marland Kitchen atorvastatin (LIPITOR) 40 MG tablet Take 40 mg by mouth daily at 6 PM.     . divalproex (DEPAKOTE ER) 250 MG 24 hr tablet Take 1 tablet every night 90 tablet 3  . donepezil (ARICEPT) 10 MG tablet TAKE 1 TABLET BY MOUTH EVERY DAY 90 tablet 3  . furosemide (LASIX) 40 MG tablet TAKE 1 TABLET (40 MG TOTAL) BY MOUTH EVERY OTHER DAY. 45 tablet  3  . glipiZIDE (GLUCOTROL) 5 MG tablet Take 2.5 mg by mouth daily.    Marland Kitchen levalbuterol (XOPENEX HFA) 45 MCG/ACT inhaler INHALE 2 PUFFS EVERY 4-6 HRS AS NEEDED  3  . metoprolol succinate (TOPROL-XL) 100 MG 24 hr tablet TAKE 1 TABLET (100 MG TOTAL) BY MOUTH DAILY. TAKE WITH OR IMMEDIATELY FOLLOWING A MEAL. 90 tablet 2  .  mirtazapine (REMERON) 15 MG tablet Take 15 mg by mouth at bedtime.    . pantoprazole (PROTONIX) 40 MG tablet Take 40 mg by mouth daily.     . potassium chloride SA (K-DUR,KLOR-CON) 20 MEQ tablet Take 2 tablets (40 mEq total) by mouth daily. 20 tablet 0  . PROAIR HFA 108 (90 Base) MCG/ACT inhaler Inhale 2 puffs into the lungs every 6 (six) hours as needed for wheezing or shortness of breath.  6   No current facility-administered medications for this encounter.     Physical Findings:  height is 5\' 5"  (1.651 m) and weight is 138 lb 4 oz (62.7 kg). Her oral temperature is 97.6 F (36.4 C). Her blood pressure is 158/83 (abnormal) and her pulse is 86. Her respiration is 18 and oxygen saturation is 98%.  Pain Assessment Pain Score: 0-No pain/10 In general this is a well appearing African American woman in no acute distress. She's alert and oriented x4 and appropriate throughout the examination. Cardiopulmonary assessment is negative for acute distress and she exhibits normal effort.   Lab Findings: Lab Results  Component Value Date   WBC 7.1 02/12/2019   HGB 11.8 (L) 02/12/2019   HCT 37.9 02/12/2019   MCV 92.7 02/12/2019   PLT 204 02/12/2019     Radiographic Findings: Nm Pet Image Restag (ps) Skull Base To Thigh  Result Date: 01/29/2019 CLINICAL DATA:  Subsequent treatment strategy for non-small-cell lung cancer. EXAM: NUCLEAR MEDICINE PET SKULL BASE TO THIGH TECHNIQUE: 6.9 mCi F-18 FDG was injected intravenously. Full-ring PET imaging was performed from the skull base to thigh after the radiotracer. CT data was obtained and used for attenuation correction and anatomic localization. Fasting blood glucose: 112 mg/dl COMPARISON:  07/03/2012 FINDINGS: Mediastinal blood pool activity: SUV max 3.1 NECK: Right-sided level II cervical node demonstrates SUV max = 31. This lymph node measures approximately 8 mm short axis on 16/4. Incidental CT findings: none CHEST: Hypermetabolism in the superior right  hilum demonstrates SUV max = 6.8. Linear bands of soft tissue in the right upper lobe along the minor fissure show hypermetabolic uptake in may be related to atelectasis or pleural involvement. The right infrahilar lymph node measured at 20 x 15 mm on the previous diagnostic CT is hypermetabolic with SUV max = 78.4. Dominant peripheral right lower lobe nodule measured previously at 12 x 14 mm is hypermetabolic with SUV max = 7.9. Some of the right middle lobe nodule seen on the prior study are hypermetabolic including the 7 mm nodule seen on 68/4. Focal hypermetabolism identified along the distal esophagus ( SUV max = 5.4) without underlying discrete lymph node or esophageal wall thickening evident (55/4). Incidental CT findings: Coronary artery calcification is evident. Atherosclerotic calcification is noted in the wall of the thoracic aorta. Large hiatal hernia evident. ABDOMEN/PELVIS: No abnormal hypermetabolic activity within the liver, pancreas, adrenal glands, or spleen. No hypermetabolic lymph nodes in the abdomen or pelvis. Incidental CT findings: Tiny calcified gallstones evident. 2.4 cm cyst noted right kidney. There is abdominal aortic atherosclerosis without aneurysm. Diverticular changes noted in the left colon. SKELETON: Hypermetabolism is seen  in the inferior right scapula without underlying correlate lesion by CT. Incidental CT findings: none IMPRESSION: 1. Hypermetabolic right lower and middle lobe pulmonary nodules associated with hypermetabolic right hilar lymphadenopathy. 2. Bandlike opacity along the minor fissure is hypermetabolic and could represent pleural disease or atelectasis. 3. Hypermetabolic right cervical lymph node consistent with metastatic disease. 4. Hypermetabolic uptake identified inferior right scapula without associated bony abnormality by CT. Metastatic involvement a concern. Electronically Signed   By: Misty Stanley M.D.   On: 01/29/2019 15:46   [place] Core Biopsy (salivary  Gland/parotid Gland)  Result Date: 02/12/2019 INDICATION: 85 year old female with a history of lung cancer and a hypermetabolic nodule within the right parotid gland on her most recent PET-CT. She presents for ultrasound-guided core biopsy to assess metastatic disease versus parotid neoplasm. EXAM: Ultrasound-guided core biopsy right parotid gland MEDICATIONS: None. ANESTHESIA/SEDATION: Moderate (conscious) sedation was employed during this procedure. A total of Versed 0.5 mg and Fentanyl 25 mcg was administered intravenously. Moderate Sedation Time: 13 minutes. The patient's level of consciousness and vital signs were monitored continuously by radiology nursing throughout the procedure under my direct supervision. FLUOROSCOPY TIME:  None COMPLICATIONS: None immediate. PROCEDURE: Informed written consent was obtained from the patient after a thorough discussion of the procedural risks, benefits and alternatives. All questions were addressed. A timeout was performed prior to the initiation of the procedure. Ultrasound was used to interrogate the right parotid space. In the posterior most aspect of the parotid gland just overlying the right internal carotid artery there is an irregular but circumscribed hypoechoic solid mass. A suitable skin entry site was selected and marked. Local anesthesia crash that the overlying skin was sterilely prepped and draped in the standard fashion using chlorhexidine skin prep. Local anesthesia was attained by infiltration with 1% lidocaine. A small dermatotomy was made. Under real-time sonographic guidance, multiple 18 gauge core biopsy needles were obtained using the Bard automated biopsy device. The specimens were placed in saline and delivered to pathology for further analysis. IMPRESSION: Ultrasound-guided core biopsy right parotid nodule. Electronically Signed   By: Jacqulynn Cadet M.D.   On: 02/12/2019 15:24    Impression/Plan: 1. 85 y.o. female with suspected recurrence  of NSCLC with enlarging right hilar node(s) and hypermetabolic RML and RLL nodules on recent PET imaging. Today, we talked to the patient and family about the findings and workup thus far. We discussed the natural history of recurrent lung cancer and general treatment, highlighting the role of radiotherapy in the management. We discussed the available radiation techniques, and focused on the details of logistics and delivery. The recommendation is for a split course of radiation treatment with 2 weeks of daily radiotherapy followed by a 3 week scheduled break prior to consideration for an additional 7-10 fraction course of treatment pending her response to treatment and tolerance. We reviewed the anticipated acute and late sequelae associated with radiation in this setting. The patient was encouraged to ask questions that were answered to her satisfaction.  At the conclusion of our conversation she elects to proceed with a split course of chest radiation.  She has freely signed written consent to proceed and a copy of this document has been placed in her chart.  She is scheduled for CT simulation tomorrow, 02/17/2019, at 8:30 am in anticipation of beginning treatment early next week.  She and her daughter appear to have a good understanding of her disease and our treatment recommendations and are in agreement with the stated plan.  They know  to call at anytime with any questions or concerns in the interim.     Nicholos Johns, PA-C  This document serves as a record of services personally performed by [employer], PA-C. It was created on her behalf by Wilburn Mylar, a trained medical scribe. The creation of this record is based on the scribe's personal observations and the provider's statements to them. This document has been checked and approved by the attending provider.

## 2019-02-17 ENCOUNTER — Ambulatory Visit
Admission: RE | Admit: 2019-02-17 | Discharge: 2019-02-17 | Disposition: A | Discharge status: Home / Self Care | Payer: Medicare Other | Source: Ambulatory Visit | Attending: Radiation Oncology | Admitting: Radiation Oncology

## 2019-02-17 DIAGNOSIS — I110 Hypertensive heart disease with heart failure: Secondary | ICD-10-CM | POA: Diagnosis not present

## 2019-02-17 DIAGNOSIS — C3491 Malignant neoplasm of unspecified part of right bronchus or lung: Secondary | ICD-10-CM

## 2019-02-17 DIAGNOSIS — E119 Type 2 diabetes mellitus without complications: Secondary | ICD-10-CM | POA: Insufficient documentation

## 2019-02-17 DIAGNOSIS — F0390 Unspecified dementia without behavioral disturbance: Secondary | ICD-10-CM | POA: Insufficient documentation

## 2019-02-17 DIAGNOSIS — I2510 Atherosclerotic heart disease of native coronary artery without angina pectoris: Secondary | ICD-10-CM | POA: Diagnosis not present

## 2019-02-17 DIAGNOSIS — M1990 Unspecified osteoarthritis, unspecified site: Secondary | ICD-10-CM | POA: Insufficient documentation

## 2019-02-17 DIAGNOSIS — Z923 Personal history of irradiation: Secondary | ICD-10-CM | POA: Diagnosis not present

## 2019-02-17 DIAGNOSIS — Z7984 Long term (current) use of oral hypoglycemic drugs: Secondary | ICD-10-CM | POA: Insufficient documentation

## 2019-02-17 DIAGNOSIS — Z79899 Other long term (current) drug therapy: Secondary | ICD-10-CM | POA: Diagnosis not present

## 2019-02-17 DIAGNOSIS — Z7982 Long term (current) use of aspirin: Secondary | ICD-10-CM | POA: Insufficient documentation

## 2019-02-17 DIAGNOSIS — C3431 Malignant neoplasm of lower lobe, right bronchus or lung: Secondary | ICD-10-CM | POA: Insufficient documentation

## 2019-02-17 DIAGNOSIS — I509 Heart failure, unspecified: Secondary | ICD-10-CM | POA: Insufficient documentation

## 2019-02-17 DIAGNOSIS — Z87891 Personal history of nicotine dependence: Secondary | ICD-10-CM | POA: Diagnosis not present

## 2019-02-18 ENCOUNTER — Ambulatory Visit: Payer: Self-pay | Admitting: Urology

## 2019-02-21 NOTE — Progress Notes (Signed)
  Radiation Oncology         [phone]) [phone] ________________________________  Name: FEMALE IAFRATE MRN: [id number]  Date: 02/17/2019  DOB: 1927/04/07  SIMULATION AND TREATMENT PLANNING NOTE    ICD-10-CM   1. Non-small cell cancer of right lung ([place]) C34.91     DIAGNOSIS:   85 yo woman with suspected recurrence of NSCLC with enlarging right hilar node(s) and RLL nodule.  NARRATIVE:  The patient was brought to the [facility].  Identity was confirmed.  All relevant records and images related to the planned course of therapy were reviewed.  The patient freely provided informed written consent to proceed with treatment after reviewing the details related to the planned course of therapy. The consent form was witnessed and verified by the simulation staff.  Then, the patient was set-up in a stable reproducible  supine position for radiation therapy.  CT images were obtained.  Surface markings were placed.  The CT images were loaded into the planning software.  Then the target and avoidance structures were contoured.  Treatment planning then occurred.  The radiation prescription was entered and confirmed.  Then, I designed and supervised the construction of a total of 6 medically necessary complex treatment devices, including a BodyFix immobilization mold custom fitted to the patient along with 5 multileaf collimators conformally shaped radiation around the treatment target while shielding critical structures such as the heart and spinal cord maximally.  I have requested : 3D Simulation  I have requested a DVH of the following structures: Left lung, right lung, spinal cord, heart, esophagus, and target.  I have ordered:Nutrition Consult  SPECIAL TREATMENT PROCEDURE:  The planned course of therapy using radiation constitutes a special treatment procedure. Special care is required in the management of this patient for the following reasons. This treatment constitutes a Special Treatment  Procedure for the following reason: [ Retreatment in a previously radiated area requiring careful monitoring of increased risk of toxicity due to overlap of previous treatment..  The special nature of the planned course of radiotherapy will require increased physician supervision and oversight to ensure patient's safety with optimal treatment outcomes.  PLAN:  The patient will receive 30 Gy in 10 fractions.  ________________________________  Sheral Apley Tammi Klippel, M.D.

## 2019-02-23 DIAGNOSIS — C3431 Malignant neoplasm of lower lobe, right bronchus or lung: Secondary | ICD-10-CM | POA: Diagnosis not present

## 2019-02-24 ENCOUNTER — Ambulatory Visit
Admission: RE | Admit: 2019-02-24 | Discharge: 2019-02-24 | Disposition: A | Discharge status: Home / Self Care | Payer: Medicare Other | Source: Ambulatory Visit | Attending: Radiation Oncology | Admitting: Radiation Oncology

## 2019-02-24 DIAGNOSIS — C3431 Malignant neoplasm of lower lobe, right bronchus or lung: Secondary | ICD-10-CM | POA: Diagnosis not present

## 2019-02-25 ENCOUNTER — Other Ambulatory Visit: Payer: Self-pay

## 2019-02-25 ENCOUNTER — Ambulatory Visit
Admission: RE | Admit: 2019-02-25 | Discharge: 2019-02-25 | Disposition: A | Discharge status: Home / Self Care | Payer: Medicare Other | Source: Ambulatory Visit | Attending: Radiation Oncology | Admitting: Radiation Oncology

## 2019-02-25 DIAGNOSIS — C3431 Malignant neoplasm of lower lobe, right bronchus or lung: Secondary | ICD-10-CM | POA: Diagnosis not present

## 2019-02-26 ENCOUNTER — Ambulatory Visit
Admission: RE | Admit: 2019-02-26 | Discharge: 2019-02-26 | Disposition: A | Discharge status: Home / Self Care | Payer: Medicare Other | Source: Ambulatory Visit | Attending: Radiation Oncology | Admitting: Radiation Oncology

## 2019-02-26 DIAGNOSIS — C3431 Malignant neoplasm of lower lobe, right bronchus or lung: Secondary | ICD-10-CM | POA: Diagnosis not present

## 2019-03-01 ENCOUNTER — Ambulatory Visit: Payer: Medicare Other

## 2019-03-02 ENCOUNTER — Ambulatory Visit
Admission: RE | Admit: 2019-03-02 | Discharge: 2019-03-02 | Disposition: A | Discharge status: Home / Self Care | Payer: Medicare Other | Source: Ambulatory Visit | Attending: Radiation Oncology | Admitting: Radiation Oncology

## 2019-03-02 ENCOUNTER — Other Ambulatory Visit: Payer: Self-pay

## 2019-03-02 DIAGNOSIS — C3431 Malignant neoplasm of lower lobe, right bronchus or lung: Secondary | ICD-10-CM | POA: Diagnosis not present

## 2019-03-03 ENCOUNTER — Other Ambulatory Visit: Payer: Self-pay

## 2019-03-03 ENCOUNTER — Ambulatory Visit
Admission: RE | Admit: 2019-03-03 | Discharge: 2019-03-03 | Disposition: A | Discharge status: Home / Self Care | Payer: Medicare Other | Source: Ambulatory Visit | Attending: Radiation Oncology | Admitting: Radiation Oncology

## 2019-03-03 DIAGNOSIS — C3431 Malignant neoplasm of lower lobe, right bronchus or lung: Secondary | ICD-10-CM | POA: Diagnosis not present

## 2019-03-04 ENCOUNTER — Other Ambulatory Visit: Payer: Self-pay

## 2019-03-04 ENCOUNTER — Ambulatory Visit
Admission: RE | Admit: 2019-03-04 | Discharge: 2019-03-04 | Disposition: A | Discharge status: Home / Self Care | Payer: Medicare Other | Source: Ambulatory Visit | Attending: Radiation Oncology | Admitting: Radiation Oncology

## 2019-03-04 DIAGNOSIS — C3431 Malignant neoplasm of lower lobe, right bronchus or lung: Secondary | ICD-10-CM | POA: Diagnosis not present

## 2019-03-05 ENCOUNTER — Ambulatory Visit
Admission: RE | Admit: 2019-03-05 | Discharge: 2019-03-05 | Disposition: A | Discharge status: Home / Self Care | Payer: Medicare Other | Source: Ambulatory Visit | Attending: Radiation Oncology | Admitting: Radiation Oncology

## 2019-03-05 ENCOUNTER — Other Ambulatory Visit: Payer: Self-pay

## 2019-03-05 DIAGNOSIS — C3431 Malignant neoplasm of lower lobe, right bronchus or lung: Secondary | ICD-10-CM | POA: Diagnosis not present

## 2019-03-08 ENCOUNTER — Other Ambulatory Visit: Payer: Self-pay

## 2019-03-08 ENCOUNTER — Ambulatory Visit
Admission: RE | Admit: 2019-03-08 | Discharge: 2019-03-08 | Disposition: A | Discharge status: Home / Self Care | Payer: Medicare Other | Source: Ambulatory Visit | Attending: Radiation Oncology | Admitting: Radiation Oncology

## 2019-03-08 DIAGNOSIS — C3431 Malignant neoplasm of lower lobe, right bronchus or lung: Secondary | ICD-10-CM | POA: Diagnosis not present

## 2019-03-09 ENCOUNTER — Ambulatory Visit: Payer: Medicare Other

## 2019-03-09 ENCOUNTER — Other Ambulatory Visit: Payer: Self-pay

## 2019-03-09 ENCOUNTER — Ambulatory Visit
Admission: RE | Admit: 2019-03-09 | Discharge: 2019-03-09 | Disposition: A | Discharge status: Home / Self Care | Payer: Medicare Other | Source: Ambulatory Visit | Attending: Radiation Oncology | Admitting: Radiation Oncology

## 2019-03-09 DIAGNOSIS — C3431 Malignant neoplasm of lower lobe, right bronchus or lung: Secondary | ICD-10-CM | POA: Diagnosis not present

## 2019-03-10 ENCOUNTER — Other Ambulatory Visit: Payer: Self-pay

## 2019-03-10 ENCOUNTER — Ambulatory Visit
Admission: RE | Admit: 2019-03-10 | Discharge: 2019-03-10 | Disposition: A | Discharge status: Home / Self Care | Payer: Medicare Other | Source: Ambulatory Visit | Attending: Radiation Oncology | Admitting: Radiation Oncology

## 2019-03-10 DIAGNOSIS — C3431 Malignant neoplasm of lower lobe, right bronchus or lung: Secondary | ICD-10-CM | POA: Diagnosis not present

## 2019-03-19 ENCOUNTER — Encounter: Payer: Self-pay | Admitting: Radiation Oncology

## 2019-03-19 NOTE — Progress Notes (Signed)
  Radiation Oncology         [phone] ________________________________  Name: Joan Hunter MRN: [id number]  Date: 03/19/2019  DOB: 1927-08-04  End of Treatment Note  Diagnosis:   85 y.o. female withsuspected recurrence of NSCLC with enlarging right hilar node(s) and hypermetabolic RML and RLL nodules on recent PET imaging     Indication for treatment:  Curative       Radiation treatment dates:   02/24/2019 - 03/10/2019  Site/dose:   Right Lung / 30 Gy in 10 fractions of 3 Gy  Beams/energy:   3D, photons / 10X, 6X  Narrative: The patient tolerated radiation treatment relatively well. She denied any pain, fatigue, issues with her skin, issues with appetite, and difficulty swallowing throughout treatment. She experienced improvement in breathing with less shortness of breath.   Plan: The patient has completed radiation treatment. The patient will return to radiation oncology clinic for routine followup in one month. I advised her to call or return sooner if she has any questions or concerns related to her recovery or treatment. ________________________________  Sheral Apley. Tammi Klippel, M.D.   This document serves as a record of services personally performed by Tyler Pita, MD. It was created on his behalf by Wilburn Mylar, a trained medical scribe. The creation of this record is based on the scribe's personal observations and the provider's statements to them. This document has been checked and approved by the attending provider.

## 2019-03-23 ENCOUNTER — Telehealth: Payer: Self-pay | Admitting: Radiation Oncology

## 2019-03-23 NOTE — Telephone Encounter (Signed)
Received message from [name], patient's daughter, requesting a return call to discuss an intermittent itchy rash her mother has had since the first week of April. [name] reports giving her mother benadryl and applying hydrocortisone daily per her PCPs direction. [name] reports her mother doesn't have a rash today. Reports when the rash appears it is consistently on her legs and arms. Explained this rash is most likely unrelated to the radiation she received in March. Reinforced her PCPs directions and encouraged follow up with PCP should rash persist. [name] verbalized understanding and expressed appreciation for the return call.

## 2019-04-15 ENCOUNTER — Ambulatory Visit: Payer: Self-pay | Admitting: Urology

## 2019-05-03 ENCOUNTER — Telehealth: Payer: Self-pay

## 2019-05-03 NOTE — Telephone Encounter (Signed)
YOUR CARDIOLOGY TEAM HAS ARRANGED FOR AN E-VISIT FOR YOUR APPOINTMENT - PLEASE REVIEW IMPORTANT INFORMATION BELOW SEVERAL DAYS PRIOR TO YOUR APPOINTMENT  Due to the recent COVID-19 pandemic, we are transitioning in-person office visits to tele-medicine visits in an effort to decrease unnecessary exposure to our patients, their families, and staff. These visits are billed to your insurance just like a normal visit is. We also encourage you to sign up for MyChart if you have not already done so. You will need a smartphone if possible. For patients that do not have this, we can still complete the visit using a regular telephone but do prefer a smartphone to enable video when possible. You may have a family member that lives with you that can help. If possible, we also ask that you have a blood pressure cuff and scale at home to measure your blood pressure, heart rate and weight prior to your scheduled appointment. Patients with clinical needs that need an in-person evaluation and testing will still be able to come to the office if absolutely necessary. If you have any questions, feel free to call our office.     YOUR PROVIDER WILL BE USING THE FOLLOWING PLATFORM TO COMPLETE YOUR VISIT: [email or web address]  . IF USING MYCHART - How to Download the MyChart App to Your SmartPhone   - If Apple, go to App Store and type in MyChart in the search bar and download the app. If Android, ask patient to go to Google Play Store and type in MyChart in the search bar and download the app. The app is free but as with any other app downloads, your phone may require you to verify saved payment information or Apple/Android password.  - You will need to then log into the app with your MyChart username and password, and select [facility] as your healthcare provider to link the account.  - When it is time for your visit, go to the MyChart app, find appointments, and click Begin Video Visit. Be sure to Select Allow for your device to  access the Microphone and Camera for your visit. You will then be connected, and your provider will be with you shortly.  **If you have any issues connecting or need assistance, please contact MyChart service desk [phone] ([phone])**  **If using a computer, in order to ensure the best quality for your visit, you will need to use either of the following Internet Browsers: Google Chrome or Microsoft Edge**  . IF USING DOXIMITY or [email or web address] - The staff will give you instructions on receiving your link to join the meeting the day of your visit.      2-3 DAYS BEFORE YOUR APPOINTMENT  You will receive a telephone call from one of our [facility] team members - your caller ID may say "Unknown caller." If this is a video visit, we will walk you through how to get the video launched on your phone. We will remind you check your blood pressure, heart rate and weight prior to your scheduled appointment. If you have an Apple Watch or Kardia, please upload any pertinent ECG strips the day before or morning of your appointment to MyChart. Our staff will also make sure you have reviewed the consent and agree to move forward with your scheduled tele-health visit.     THE DAY OF YOUR APPOINTMENT  Approximately 15 minutes prior to your scheduled appointment, you will receive a telephone call from one of [facility] team - your caller ID may say "Unknown caller."    Our staff will confirm medications, vital signs for the day and any symptoms you may be experiencing. Please have this information available prior to the time of visit start. It may also be helpful for you to have a pad of paper and pen handy for any instructions given during your visit. They will also walk you through joining the smartphone meeting if this is a video visit.    CONSENT FOR TELE-HEALTH VISIT - PLEASE REVIEW  I hereby voluntarily request, consent and authorize [facility] and its employed or contracted physicians, physician  assistants, nurse practitioners or other licensed health care professionals (the Practitioner), to provide me with telemedicine health care services (the "Services") as deemed necessary by the treating Practitioner. I acknowledge and consent to receive the Services by the Practitioner via telemedicine. I understand that the telemedicine visit will involve communicating with the Practitioner through live audiovisual communication technology and the disclosure of certain medical information by electronic transmission. I acknowledge that I have been given the opportunity to request an in-person assessment or other available alternative prior to the telemedicine visit and am voluntarily participating in the telemedicine visit.  I understand that I have the right to withhold or withdraw my consent to the use of telemedicine in the course of my care at any time, without affecting my right to future care or treatment, and that the Practitioner or I may terminate the telemedicine visit at any time. I understand that I have the right to inspect all information obtained and/or recorded in the course of the telemedicine visit and may receive copies of available information for a reasonable fee.  I understand that some of the potential risks of receiving the Services via telemedicine include:  . Delay or interruption in medical evaluation due to technological equipment failure or disruption; . Information transmitted may not be sufficient (e.g. poor resolution of images) to allow for appropriate medical decision making by the Practitioner; and/or  . In rare instances, security protocols could fail, causing a breach of personal health information.  Furthermore, I acknowledge that it is my responsibility to provide information about my medical history, conditions and care that is complete and accurate to the best of my ability. I acknowledge that Practitioner's advice, recommendations, and/or decision may be based on  factors not within their control, such as incomplete or inaccurate data provided by me or distortions of diagnostic images or specimens that may result from electronic transmissions. I understand that the practice of medicine is not an exact science and that Practitioner makes no warranties or guarantees regarding treatment outcomes. I acknowledge that I will receive a copy of this consent concurrently upon execution via email to the email address I last provided but may also request a printed copy by calling the office of [facility].    I understand that my insurance will be billed for this visit.   I have read or had this consent read to me. . I understand the contents of this consent, which adequately explains the benefits and risks of the Services being provided via telemedicine.  . I have been provided ample opportunity to ask questions regarding this consent and the Services and have had my questions answered to my satisfaction. . I give my informed consent for the services to be provided through the use of telemedicine in my medical care  By participating in this telemedicine visit I agree to the above.  

## 2019-05-04 ENCOUNTER — Ambulatory Visit: Payer: Medicare Other | Admitting: Cardiology

## 2019-05-05 ENCOUNTER — Encounter: Payer: Self-pay | Admitting: Cardiology

## 2019-05-05 ENCOUNTER — Telehealth (INDEPENDENT_AMBULATORY_CARE_PROVIDER_SITE_OTHER): Payer: Medicare Other | Admitting: Cardiology

## 2019-05-05 ENCOUNTER — Other Ambulatory Visit: Payer: Self-pay

## 2019-05-05 VITALS — BP 126/91 | HR 104 | Ht 65.0 in

## 2019-05-05 DIAGNOSIS — I2583 Coronary atherosclerosis due to lipid rich plaque: Secondary | ICD-10-CM

## 2019-05-05 DIAGNOSIS — I2510 Atherosclerotic heart disease of native coronary artery without angina pectoris: Secondary | ICD-10-CM

## 2019-05-05 DIAGNOSIS — I700 Atherosclerosis of aorta: Secondary | ICD-10-CM

## 2019-05-05 DIAGNOSIS — J8410 Pulmonary fibrosis, unspecified: Secondary | ICD-10-CM

## 2019-05-05 DIAGNOSIS — I340 Nonrheumatic mitral (valve) insufficiency: Secondary | ICD-10-CM

## 2019-05-05 DIAGNOSIS — I10 Essential (primary) hypertension: Secondary | ICD-10-CM

## 2019-05-05 NOTE — Patient Instructions (Signed)
Medication Instructions:  Your physician recommends that you continue on your current medications as directed. Please refer to the Current Medication list given to you today.  If you need a refill on your cardiac medications before your next appointment, please call your pharmacy.   Lab work: None If you have labs (blood work) drawn today and your tests are completely normal, you will receive your results only by: Marland Kitchen MyChart Message (if you have MyChart) OR . A paper copy in the mail If you have any lab test that is abnormal or we need to change your treatment, we will call you to review the results.  Testing/Procedures: None  Follow-Up: At [facility], you and your health needs are our priority.  As part of our continuing mission to provide you with exceptional heart care, we have created designated Provider Care Teams.  These Care Teams include your primary Cardiologist (physician) and Advanced Practice Providers (APPs -  Physician Assistants and Nurse Practitioners) who all work together to provide you with the care you need, when you need it. You will need a follow up appointment in 3 months for a virtual visit.  Please call our office 2 months in advance to schedule this appointment.  You may see Candee Furbish, MD or one of the following Advanced Practice Providers on your designated Care Team:   Truitt Merle, NP Cecilie Kicks, NP . Kathyrn Drown, NP  Any Other Special Instructions Will Be Listed Below (If Applicable).

## 2019-05-05 NOTE — Progress Notes (Signed)
Virtual Visit via Video Note   This visit type was conducted due to national recommendations for restrictions regarding the COVID-19 Pandemic (e.g. social distancing) in an effort to limit this patient's exposure and mitigate transmission in our community.  Due to her co-morbid illnesses, this patient is at least at moderate risk for complications without adequate follow up.  This format is felt to be most appropriate for this patient at this time.  All issues noted in this document were discussed and addressed.  A limited physical exam was performed with this format.  Please refer to the patient's chart for her consent to telehealth for [facility].   Date:  05/05/2019   ID:  Joan Hunter, DOB 03/22/1927, MRN [id number]  Patient Location: Home Provider Location: Home  PCP:  [name], MD  Cardiologist:  Candee Furbish, MD  Electrophysiologist:  None   Evaluation Performed:  Follow-Up Visit  Chief Complaint: Cardiomyopathy follow-up  History of Present Illness:    Joan Hunter is a 85 y.o. female with  moderate coronary artery disease prior catheterization in 2009, prior idiopathic cardiomyopathy (EF was 15%) now resolved 55%, mitral regurgitation mild here for followup.  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 Dr. Tammi Klippel. Radiation only.   Dr. Melvyn Novas changed Coreg to Bystolic to bisoprolol, but she is now back on Toprol. COPD/bronchitis. Treating hiatal hernia with Protonix as well.   Continuing with atorvastatin, hyperlipidemia.  She experienced some dizziness when standing and her medications were previously reduced. This has improved.  Her daughter, [name], helps her out significantly but she also make sure that she gets up and moves around adequately.   I encouraged her to use her walker. She has a split-level house. Concerned about stair use.   04/16/17-she has been  having some more deconditioning, or shortness of breath with activity. Her daughter still has to motivate her to get up she states. She denies any chest pain. No orthopnea. No edema. No increase in weight.   01/13/17-mild shortness of breath with activity, no significant change.  She is trying to maintain her weight.  Mild shortness of breath with activity, she is trying to maintain her weight.  No significant change.  Her heart rate seems to be increased today.  Her daughter does not think that she has been taking her bisoprolol or amlodipine.  Her blood pressure is quite elevated as well. Heart rate 116 sinus tach  04/21/18 -still having some sinus tachycardia despite metoprolol 50.  We are increasing to 100.  She has had some wheezing when walking.  Mild cough.  Has not seen Dr. Melvyn Novas in quite some time.  She is seeing Dr. [name].  No syncope, no orthopnea, no PND, no bleeding.  08/04/2018-no chest pain fevers chills nausea vomiting syncope bleeding.  Still encouraging motion, movement.  Feels well.  11/03/2018-chief complaint follow-up for shortness of breath prior cardiomyopathy.  Overall she been doing quite well.  Still ambulating with walker.  Here with her daughter.  No chest pain fevers chills nausea vomiting syncope.  Weight has been fairly stable.  02/04/2019-here for the follow-up of coronary artery disease prior cardiomyopathy, hyperlipidemia.  No major changes today.  She has a birthday to celebrate in 4 days.  89th.  She sometimes states that she is confused in the morning.  She did go to the oncologist recently, slight increase in lung mass.  PET scan results pending.  Overall  no change in symptoms.  Ankles slightly edematous.  Left greater than right  05/05/2019-chief complaint prior cardiomyopathy follow-up.  Overall doing very well.  No shortness of breath, no chest pain, no fevers, no orthopnea.  Her daughter [name] is helping to take care of her.  She is staying in the house.   Compliant with her medications.  No syncope.   The patient does not have symptoms concerning for COVID-19 infection (fever, chills, cough, or new shortness of breath).    Past Medical History:  Diagnosis Date  . Allergic rhinitis   . Arthritis   . CAD (coronary artery disease)    mod 2V CAD (60% LAD, 70-80% mid CX) by 01/2008 cath  . CHF (congestive heart failure) ([place])   . Dementia ([place])   . Diabetes mellitus    dx 10 plus yrs ago  . Dilated cardiomyopathy ([place])    now resolved, EF 60-65% echo 10/11/11 (Dr. Marlou Porch)  . Diverticulosis   . Hyperlipidemia   . Hypertension   . Hypokalemia 01/09/2017  . Lung cancer ([place])   . Lung mass   . MR (mitral regurgitation)   . Osteopenia    Past Surgical History:  Procedure Laterality Date  . ABDOMINAL HYSTERECTOMY    . APPENDECTOMY    . EYE SURGERY     bil cataracts  . KNEE ARTHROSCOPY     ?  left  knee  . TONSILLECTOMY       Current Meds  Medication Sig  . amLODipine (NORVASC) 5 MG tablet TAKE 1 TABLET BY MOUTH EVERYDAY AT BEDTIME  . aspirin 81 MG tablet Take 81 mg by mouth daily.  Marland Kitchen atorvastatin (LIPITOR) 40 MG tablet Take 40 mg by mouth daily at 6 PM.   . divalproex (DEPAKOTE ER) 250 MG 24 hr tablet Take 1 tablet every night  . donepezil (ARICEPT) 10 MG tablet TAKE 1 TABLET BY MOUTH EVERY DAY  . furosemide (LASIX) 40 MG tablet TAKE 1 TABLET (40 MG TOTAL) BY MOUTH EVERY OTHER DAY.  Marland Kitchen glipiZIDE (GLUCOTROL) 5 MG tablet Take 2.5 mg by mouth daily.  Marland Kitchen levalbuterol (XOPENEX HFA) 45 MCG/ACT inhaler INHALE 2 PUFFS EVERY 4-6 HRS AS NEEDED  . metoprolol succinate (TOPROL-XL) 100 MG 24 hr tablet TAKE 1 TABLET (100 MG TOTAL) BY MOUTH DAILY. TAKE WITH OR IMMEDIATELY FOLLOWING A MEAL.  . mirtazapine (REMERON) 15 MG tablet Take 15 mg by mouth at bedtime.  . pantoprazole (PROTONIX) 40 MG tablet Take 40 mg by mouth daily.   . potassium chloride SA (K-DUR,KLOR-CON) 20 MEQ tablet Take 2 tablets (40 mEq total) by mouth daily.  Marland Kitchen PROAIR HFA 108  (90 Base) MCG/ACT inhaler Inhale 2 puffs into the lungs every 6 (six) hours as needed for wheezing or shortness of breath.     Allergies:   Moxifloxacin; Penicillins; and Prednisone   Social History   Tobacco Use  . Smoking status: Former Smoker    Packs/day: 0.50    Years: 5.00    Pack years: 2.50    Types: Cigarettes    Last attempt to quit: 06/15/1972    Years since quitting: 46.9  . Smokeless tobacco: Never Used  Substance Use Topics  . Alcohol use: No  . Drug use: No     Family Hx: The patient's family history includes Colon cancer in her mother; Heart disease in her brother.  ROS:   Please see the history of present illness.     All other systems reviewed and are negative.  Prior CV studies:   The following studies were reviewed today:  Prior echocardiogram reviewed  Labs/Other Tests and Data Reviewed:    EKG:  An ECG dated 02/04/2019 was personally reviewed today and demonstrated:  Sinus tachycardia 107  Recent Labs: 01/05/2019: ALT 9; BUN 23; Creatinine 1.34; Potassium 3.7; Sodium 142 02/12/2019: Hemoglobin 11.8; Platelets 204   Recent Lipid Panel Lab Results  Component Value Date/Time   CHOL 143 06/15/2012 09:43 AM   TRIG 83 06/15/2012 09:43 AM   HDL 62 06/15/2012 09:43 AM   CHOLHDL 2.3 06/15/2012 09:43 AM   LDLCALC 64 06/15/2012 09:43 AM    Wt Readings from Last 3 Encounters:  02/16/19 138 lb 4 oz (62.7 kg)  02/12/19 147 lb 11.3 oz (67 kg)  02/04/19 148 lb 6.4 oz (67.3 kg)     Objective:    Vital Signs:  BP (!) 126/91   Pulse (!) 104   Ht 5\' 5"  (1.651 m)   BMI 23.01 kg/m    VITAL SIGNS:  reviewed GEN:  no acute distress EYES:  sclerae anicteric, EOMI - Extraocular Movements Intact RESPIRATORY:  normal respiratory effort, symmetric expansion SKIN:  no rash, lesions or ulcers. MUSCULOSKELETAL:  no obvious deformities. NEURO:  alert and oriented x 3, no obvious focal deficit PSYCH:  normal affect  ASSESSMENT & PLAN:     History of  dilated cardiomyopathy  - Ejection fraction previously 15%, now normal at 65%. Excellent.  Back on Toprol because of tachycardia.  Doing well on Toprol 100.  Once again medications reviewed, stable doing well.  Non-small cell cancer of right lung, stage IA  - Radiation only   - Dr. Lovenia Kim signs of disease recurrence. Reviewed his office note from 01/09/18.  Excellent.  Notes reviewed.  Continues to have this monitored.  - Had last radiation in March. Supposed to call again.   Pulmonary fibrosis   -  Dr. Melvyn Novas   She has not seen him in quite some time.  No recent exacerbations or issues.  Mitral regurgitation   - After cardiomyopathy resolved, mitral regurgitation also now mild.  Doing well, functional MR previously, no changes.  Continue to monitor, doing well currently with no shortness of breath.  Left anterior fascicular block   - Conduction disorder. No changes, no syncope.  If this were to deteriorate, pacemaker may be warranted in the future.  For now she is doing great.  Essential hypertension  -Continue with Toprol-XL 100 mg.  Heart rate improved bisoprolol was discontinued.  Pulse has been about 90-100 previously.  Aortic atherosclerosis  - Continue with secondary prevention.  No changes.  Continue with secondary risk factor prevention.  Continue with good blood pressure control.  3 month follow up again. (Daughter requests).  COVID-19 Education: The signs and symptoms of COVID-19 were discussed with the patient and how to seek care for testing (follow up with PCP or arrange E-visit).  The importance of social distancing was discussed today.  Time:   Today, I have spent 11 minutes with the patient with telehealth technology discussing the above problems.     Medication Adjustments/Labs and Tests Ordered: Current medicines are reviewed at length with the patient today.  Concerns regarding medicines are outlined above.   Tests Ordered: No orders of the defined  types were placed in this encounter.   Medication Changes: No orders of the defined types were placed in this encounter.   Disposition:  Follow up in 3 month(s)  Signed, Candee Furbish, MD  05/05/2019 3:33 PM    [facility]

## 2019-05-18 ENCOUNTER — Other Ambulatory Visit: Payer: Self-pay | Admitting: Cardiology

## 2019-07-08 ENCOUNTER — Other Ambulatory Visit: Payer: Self-pay

## 2019-07-08 ENCOUNTER — Telehealth: Payer: Self-pay | Admitting: Medical Oncology

## 2019-07-08 ENCOUNTER — Inpatient Hospital Stay: Payer: Medicare Other | Attending: Internal Medicine

## 2019-07-08 ENCOUNTER — Ambulatory Visit (HOSPITAL_COMMUNITY)
Admission: RE | Admit: 2019-07-08 | Discharge: 2019-07-08 | Disposition: A | Discharge status: Home / Self Care | Payer: Medicare Other | Source: Ambulatory Visit | Attending: Internal Medicine | Admitting: Internal Medicine

## 2019-07-08 ENCOUNTER — Encounter (HOSPITAL_COMMUNITY): Payer: Self-pay

## 2019-07-08 DIAGNOSIS — F0390 Unspecified dementia without behavioral disturbance: Secondary | ICD-10-CM | POA: Insufficient documentation

## 2019-07-08 DIAGNOSIS — K449 Diaphragmatic hernia without obstruction or gangrene: Secondary | ICD-10-CM | POA: Insufficient documentation

## 2019-07-08 DIAGNOSIS — I110 Hypertensive heart disease with heart failure: Secondary | ICD-10-CM | POA: Insufficient documentation

## 2019-07-08 DIAGNOSIS — R634 Abnormal weight loss: Secondary | ICD-10-CM | POA: Insufficient documentation

## 2019-07-08 DIAGNOSIS — E785 Hyperlipidemia, unspecified: Secondary | ICD-10-CM | POA: Insufficient documentation

## 2019-07-08 DIAGNOSIS — I509 Heart failure, unspecified: Secondary | ICD-10-CM | POA: Insufficient documentation

## 2019-07-08 DIAGNOSIS — Z7982 Long term (current) use of aspirin: Secondary | ICD-10-CM | POA: Insufficient documentation

## 2019-07-08 DIAGNOSIS — Z7901 Long term (current) use of anticoagulants: Secondary | ICD-10-CM | POA: Insufficient documentation

## 2019-07-08 DIAGNOSIS — Z79899 Other long term (current) drug therapy: Secondary | ICD-10-CM | POA: Insufficient documentation

## 2019-07-08 DIAGNOSIS — I340 Nonrheumatic mitral (valve) insufficiency: Secondary | ICD-10-CM | POA: Diagnosis not present

## 2019-07-08 DIAGNOSIS — I420 Dilated cardiomyopathy: Secondary | ICD-10-CM | POA: Diagnosis not present

## 2019-07-08 DIAGNOSIS — I700 Atherosclerosis of aorta: Secondary | ICD-10-CM | POA: Diagnosis not present

## 2019-07-08 DIAGNOSIS — J439 Emphysema, unspecified: Secondary | ICD-10-CM | POA: Diagnosis not present

## 2019-07-08 DIAGNOSIS — E119 Type 2 diabetes mellitus without complications: Secondary | ICD-10-CM | POA: Insufficient documentation

## 2019-07-08 DIAGNOSIS — J90 Pleural effusion, not elsewhere classified: Secondary | ICD-10-CM | POA: Diagnosis not present

## 2019-07-08 DIAGNOSIS — C3490 Malignant neoplasm of unspecified part of unspecified bronchus or lung: Secondary | ICD-10-CM | POA: Diagnosis not present

## 2019-07-08 DIAGNOSIS — C3431 Malignant neoplasm of lower lobe, right bronchus or lung: Secondary | ICD-10-CM | POA: Insufficient documentation

## 2019-07-08 DIAGNOSIS — I2510 Atherosclerotic heart disease of native coronary artery without angina pectoris: Secondary | ICD-10-CM | POA: Insufficient documentation

## 2019-07-08 DIAGNOSIS — M8580 Other specified disorders of bone density and structure, unspecified site: Secondary | ICD-10-CM | POA: Insufficient documentation

## 2019-07-08 LAB — CMP (CANCER CENTER ONLY)
ALT: 6 U/L (ref 0–44)
AST: 15 U/L (ref 15–41)
Albumin: 2.8 g/dL — ABNORMAL LOW (ref 3.5–5.0)
Alkaline Phosphatase: 97 U/L (ref 38–126)
Anion gap: 12 (ref 5–15)
BUN: 13 mg/dL (ref 8–23)
CO2: 24 mmol/L (ref 22–32)
Calcium: 8.8 mg/dL — ABNORMAL LOW (ref 8.9–10.3)
Chloride: 106 mmol/L (ref 98–111)
Creatinine: 1.17 mg/dL — ABNORMAL HIGH (ref 0.44–1.00)
GFR, Est AFR Am: 48 mL/min — ABNORMAL LOW (ref >60)
GFR, Estimated: 41 mL/min — ABNORMAL LOW (ref >60)
Glucose, Bld: 172 mg/dL — ABNORMAL HIGH (ref 70–99)
Potassium: 3.1 mmol/L — ABNORMAL LOW (ref 3.5–5.1)
Sodium: 142 mmol/L (ref 135–145)
Total Bilirubin: 0.5 mg/dL (ref 0.3–1.2)
Total Protein: 7.4 g/dL (ref 6.5–8.1)

## 2019-07-08 LAB — CBC WITH DIFFERENTIAL (CANCER CENTER ONLY)
Abs Immature Granulocytes: 0.03 10*3/uL (ref 0.00–0.07)
Basophils Absolute: 0 10*3/uL (ref 0.0–0.1)
Basophils Relative: 0 %
Eosinophils Absolute: 0.2 10*3/uL (ref 0.0–0.5)
Eosinophils Relative: 2 %
HCT: 36 % (ref 36.0–46.0)
Hemoglobin: 11.7 g/dL — ABNORMAL LOW (ref 12.0–15.0)
Immature Granulocytes: 0 %
Lymphocytes Relative: 18 %
Lymphs Abs: 1.4 10*3/uL (ref 0.7–4.0)
MCH: 28.4 pg (ref 26.0–34.0)
MCHC: 32.5 g/dL (ref 30.0–36.0)
MCV: 87.4 fL (ref 80.0–100.0)
Monocytes Absolute: 0.8 10*3/uL (ref 0.1–1.0)
Monocytes Relative: 10 %
Neutro Abs: 5.3 10*3/uL (ref 1.7–7.7)
Neutrophils Relative %: 70 %
Platelet Count: 192 10*3/uL (ref 150–400)
RBC: 4.12 MIL/uL (ref 3.87–5.11)
RDW: 14.2 % (ref 11.5–15.5)
WBC Count: 7.7 10*3/uL (ref 4.0–10.5)
nRBC: 0 % (ref 0.0–0.2)

## 2019-07-08 NOTE — Telephone Encounter (Signed)
Please call her when pt is in the room with Dr Julien Nordmann on Monday. Noted

## 2019-07-12 ENCOUNTER — Encounter: Payer: Self-pay | Admitting: Internal Medicine

## 2019-07-12 ENCOUNTER — Other Ambulatory Visit: Payer: Self-pay

## 2019-07-12 ENCOUNTER — Other Ambulatory Visit: Payer: Self-pay | Admitting: Medical Oncology

## 2019-07-12 ENCOUNTER — Inpatient Hospital Stay (HOSPITAL_BASED_OUTPATIENT_CLINIC_OR_DEPARTMENT_OTHER): Payer: Medicare Other | Admitting: Internal Medicine

## 2019-07-12 VITALS — BP 117/75 | HR 107 | Temp 99.1°F | Resp 18 | Wt 134.0 lb

## 2019-07-12 DIAGNOSIS — J90 Pleural effusion, not elsewhere classified: Secondary | ICD-10-CM

## 2019-07-12 DIAGNOSIS — F0390 Unspecified dementia without behavioral disturbance: Secondary | ICD-10-CM

## 2019-07-12 DIAGNOSIS — I340 Nonrheumatic mitral (valve) insufficiency: Secondary | ICD-10-CM

## 2019-07-12 DIAGNOSIS — C3491 Malignant neoplasm of unspecified part of right bronchus or lung: Secondary | ICD-10-CM

## 2019-07-12 DIAGNOSIS — C3490 Malignant neoplasm of unspecified part of unspecified bronchus or lung: Secondary | ICD-10-CM

## 2019-07-12 DIAGNOSIS — Z7901 Long term (current) use of anticoagulants: Secondary | ICD-10-CM

## 2019-07-12 DIAGNOSIS — I110 Hypertensive heart disease with heart failure: Secondary | ICD-10-CM

## 2019-07-12 DIAGNOSIS — R634 Abnormal weight loss: Secondary | ICD-10-CM | POA: Diagnosis not present

## 2019-07-12 DIAGNOSIS — C3431 Malignant neoplasm of lower lobe, right bronchus or lung: Secondary | ICD-10-CM | POA: Diagnosis not present

## 2019-07-12 DIAGNOSIS — I2510 Atherosclerotic heart disease of native coronary artery without angina pectoris: Secondary | ICD-10-CM

## 2019-07-12 DIAGNOSIS — I509 Heart failure, unspecified: Secondary | ICD-10-CM | POA: Diagnosis not present

## 2019-07-12 DIAGNOSIS — I420 Dilated cardiomyopathy: Secondary | ICD-10-CM

## 2019-07-12 DIAGNOSIS — K449 Diaphragmatic hernia without obstruction or gangrene: Secondary | ICD-10-CM

## 2019-07-12 DIAGNOSIS — Z79899 Other long term (current) drug therapy: Secondary | ICD-10-CM

## 2019-07-12 DIAGNOSIS — J439 Emphysema, unspecified: Secondary | ICD-10-CM

## 2019-07-12 DIAGNOSIS — E876 Hypokalemia: Secondary | ICD-10-CM

## 2019-07-12 DIAGNOSIS — E119 Type 2 diabetes mellitus without complications: Secondary | ICD-10-CM

## 2019-07-12 DIAGNOSIS — I700 Atherosclerosis of aorta: Secondary | ICD-10-CM

## 2019-07-12 DIAGNOSIS — E785 Hyperlipidemia, unspecified: Secondary | ICD-10-CM

## 2019-07-12 DIAGNOSIS — M8580 Other specified disorders of bone density and structure, unspecified site: Secondary | ICD-10-CM

## 2019-07-12 DIAGNOSIS — Z7982 Long term (current) use of aspirin: Secondary | ICD-10-CM

## 2019-07-12 MED ORDER — POTASSIUM CHLORIDE CRYS ER 20 MEQ PO TBCR: 20.0000 meq | EXTENDED_RELEASE_TABLET | Freq: Every day | ORAL | 0 refills | Status: DC

## 2019-07-12 NOTE — Progress Notes (Signed)
[facility] [phone]   [phone]  OFFICE PROGRESS NOTE  [name], MD [address] [name] [address]  DIAGNOSIS: Stage IA (T1b, N0, M0) non-small cell lung cancer consistent with adenocarcinoma involving the right lower lobe, diagnosed in September 2014.  PRIOR THERAPY: Status post curative stereotactic radiotherapy to the right lower lobe lung nodule under the care of Dr. Tammi Klippel completed on 10/29/2013.  CURRENT THERAPY: Observation.  INTERVAL HISTORY: Joan Hunter 85 y.o. female returns to the clinic today for follow-up visit.  Her daughter was available by phone during the visit.  The patient is feeling fine today with no concerning complaints.  She lost around 14 pounds since her last visit but she is currently on Lasix because of congestive heart failure.  She is good at times.  She denied having any fever or chills.  She has no nausea, vomiting, diarrhea or constipation.  The patient has no chest pain, shortness of breath, cough or hemoptysis.  She had repeat CT scan of the chest performed recently and she is here for evaluation and discussion of her scan results.  MEDICAL HISTORY: Past Medical History:  Diagnosis Date  . Allergic rhinitis   . Arthritis   . CAD (coronary artery disease)    mod 2V CAD (60% LAD, 70-80% mid CX) by 01/2008 cath  . CHF (congestive heart failure) ([place])   . Dementia ([place])   . Diabetes mellitus    dx 10 plus yrs ago  . Dilated cardiomyopathy ([place])    now resolved, EF 60-65% echo 10/11/11 (Dr. Marlou Porch)  . Diverticulosis   . Hyperlipidemia   . Hypertension   . Hypokalemia 01/09/2017  . Lung cancer ([place])   . Lung mass   . MR (mitral regurgitation)   . Osteopenia     ALLERGIES:  is allergic to moxifloxacin; penicillins; and prednisone.  MEDICATIONS:  Current Outpatient Medications  Medication Sig Dispense Refill  . amLODipine (NORVASC) 5 MG tablet TAKE 1 TABLET BY MOUTH  EVERYDAY AT BEDTIME 30 tablet 11  . aspirin 81 MG tablet Take 81 mg by mouth daily.    Marland Kitchen atorvastatin (LIPITOR) 40 MG tablet Take 40 mg by mouth daily at 6 PM.     . divalproex (DEPAKOTE ER) 250 MG 24 hr tablet Take 1 tablet every night 90 tablet 3  . donepezil (ARICEPT) 10 MG tablet TAKE 1 TABLET BY MOUTH EVERY DAY 90 tablet 3  . furosemide (LASIX) 40 MG tablet TAKE 1 TABLET (40 MG TOTAL) BY MOUTH EVERY OTHER DAY. 45 tablet 3  . glipiZIDE (GLUCOTROL) 5 MG tablet Take 2.5 mg by mouth daily.    Marland Kitchen HYDROcodone-homatropine (HYCODAN) 5-1.5 MG/5ML syrup Take 5 mLs by mouth every 6 (six) hours as needed.    . levalbuterol (XOPENEX HFA) 45 MCG/ACT inhaler INHALE 2 PUFFS EVERY 4-6 HRS AS NEEDED  3  . metoprolol succinate (TOPROL-XL) 100 MG 24 hr tablet TAKE 1 TABLET (100 MG TOTAL) BY MOUTH DAILY. TAKE WITH OR IMMEDIATELY FOLLOWING A MEAL. 90 tablet 2  . mirtazapine (REMERON) 15 MG tablet Take 15 mg by mouth at bedtime.    . pantoprazole (PROTONIX) 40 MG tablet Take 40 mg by mouth daily.     . potassium chloride SA (K-DUR) 20 MEQ tablet Take 1 tablet (20 mEq total) by mouth daily. 7 tablet 0  . PROAIR HFA 108 (90 Base) MCG/ACT inhaler Inhale 2 puffs into the lungs every 6 (six)  hours as needed for wheezing or shortness of breath.  6   No current facility-administered medications for this visit.     SURGICAL HISTORY:  Past Surgical History:  Procedure Laterality Date  . ABDOMINAL HYSTERECTOMY    . APPENDECTOMY    . EYE SURGERY     bil cataracts  . KNEE ARTHROSCOPY     ?  left  knee  . TONSILLECTOMY      REVIEW OF SYSTEMS:  A comprehensive review of systems was negative except for: Constitutional: positive for weight loss   PHYSICAL EXAMINATION: General appearance: alert, cooperative, fatigued and no distress Head: Normocephalic, without obvious abnormality, atraumatic Neck: no adenopathy, no JVD, supple, symmetrical, trachea midline and thyroid not enlarged, symmetric, no  tenderness/mass/nodules Lymph nodes: Cervical, supraclavicular, and axillary nodes normal. Resp: clear to auscultation bilaterally Back: symmetric, no curvature. ROM normal. No CVA tenderness. Cardio: regular rate and rhythm, S1, S2 normal, no murmur, click, rub or gallop GI: soft, non-tender; bowel sounds normal; no masses,  no organomegaly Extremities: extremities normal, atraumatic, no cyanosis or edema  ECOG PERFORMANCE STATUS: 1 - Symptomatic but completely ambulatory  Blood pressure 117/75, pulse (!) 107, temperature 99.1 F (37.3 C), temperature source Oral, resp. rate 18, weight 134 lb 0.1 oz (60.8 kg), SpO2 94 %.  LABORATORY DATA: Lab Results  Component Value Date   WBC 7.7 07/08/2019   HGB 11.7 (L) 07/08/2019   HCT 36.0 07/08/2019   MCV 87.4 07/08/2019   PLT 192 07/08/2019      Chemistry      Component Value Date/Time   NA 142 07/08/2019 1047   NA 144 12/26/2016 1022   K 3.1 (L) 07/08/2019 1047   K 2.9 (LL) 12/26/2016 1022   CL 106 07/08/2019 1047   CO2 24 07/08/2019 1047   CO2 29 12/26/2016 1022   BUN 13 07/08/2019 1047   BUN 16.7 12/26/2016 1022   CREATININE 1.17 (H) 07/08/2019 1047   CREATININE 1.0 12/26/2016 1022      Component Value Date/Time   CALCIUM 8.8 (L) 07/08/2019 1047   CALCIUM 9.3 12/26/2016 1022   ALKPHOS 97 07/08/2019 1047   ALKPHOS 105 12/26/2016 1022   AST 15 07/08/2019 1047   AST 16 12/26/2016 1022   ALT 6 07/08/2019 1047   ALT 8 12/26/2016 1022   BILITOT 0.5 07/08/2019 1047   BILITOT 0.84 12/26/2016 1022       RADIOGRAPHIC STUDIES: Ct Chest Wo Contrast  Result Date: 07/09/2019 CLINICAL DATA:  Lung cancer.  Radiation therapy complete. EXAM: CT CHEST WITHOUT CONTRAST TECHNIQUE: Multidetector CT imaging of the chest was performed following the standard protocol without IV contrast. COMPARISON:  01/05/2019. FINDINGS: Cardiovascular: Atherosclerotic calcification of the aorta, aortic valve and coronary arteries. Heart size normal. No  pericardial effusion. Mediastinum/Nodes: Mediastinal lymph nodes measure up to 10 mm in the left paratracheal station, as before. Hilar regions are difficult to evaluate without IV contrast. No axillary adenopathy. Esophagus is somewhat dilated. Lungs/Pleura: Image quality is degraded by respiratory motion. Centrilobular emphysema. Post radiation architectural distortion, volume loss and consolidation in the medial right hemithorax. There are new areas of ground-glass and consolidation in the posterior segment right upper lobe as well as right lower lobe. Peripheral inferior right lower lobe nodule measures 11 mm (series 7, image 89), stable. Nodularity and mucoid impaction in the right middle lobe are unchanged. Small loculated right pleural effusion is largely new. Calcified granulomas. Mild scarring and volume loss in the lingula. Airway is otherwise unremarkable.  Upper Abdomen: Visualized portions of the liver, gallbladder and adrenal glands are unremarkable. Subcentimeter low-attenuation lesions in the kidneys are too small to characterize. Visualized portions of the spleen, pancreas, stomach and bowel are unremarkable with exception of a large hiatal hernia. No upper abdominal adenopathy. Musculoskeletal: Degenerative changes in the spine. There is sclerosis in the posterolateral aspects of the left fourth, fifth and sixth ribs, as before. Midthoracic compression deformities and kyphosis are unchanged. IMPRESSION: 1. New/increasing ground-glass and consolidation in the posterior segment right upper lobe and right lower lobe, findings which may be related to radiation therapy, completed on 03/10/2019. 2. Right lower lobe nodule is stable. 3. Tiny likely did right pleural effusion, new. 4. Large hiatal hernia. 5. Aortic atherosclerosis (ICD10-170.0). Coronary artery calcification. 6.  Emphysema (ICD10-J43.9). Electronically Signed   By: Lorin Picket M.D.   On: 07/09/2019 08:43   ASSESSMENT AND PLAN:  This  is a very pleasant 85 years old African-American female with stage IA non-small cell lung cancer. She status post curative SBRT to the right lower lobe nodule completed in November 2014. The patient has been on observation since that time and she is feeling fine with no concerning complaints. For hypertension I recommended for her to monitor her blood pressure closely. She had repeat CT scan of the chest performed recently.  I personally and independently reviewed the scans and discussed the results with the patient today. Her scan showed no concerning findings for disease progression. I recommended for her to continue on observation with repeat CT scan of the chest in 1 year. She was advised to call immediately if she has any concerning symptoms in the interval. The patient voices understanding of current disease status and treatment options and is in agreement with the current care plan. All questions were answered. The patient knows to call the clinic with any problems, questions or concerns. We can certainly see the patient much sooner if necessary.  Disclaimer: This note was dictated with voice recognition software. Similar sounding words can inadvertently be transcribed and may not be corrected upon review.

## 2019-07-13 ENCOUNTER — Telehealth: Payer: Self-pay | Admitting: Internal Medicine

## 2019-07-13 NOTE — Telephone Encounter (Signed)
Scheduled appt per 7/27 los - mailed letter with appt date and time. F/u and scan next year. Central radiology to contact patient with scan appt.

## 2019-08-17 ENCOUNTER — Encounter: Payer: Self-pay | Admitting: Neurology

## 2019-08-17 ENCOUNTER — Other Ambulatory Visit: Payer: Self-pay

## 2019-08-17 ENCOUNTER — Telehealth (INDEPENDENT_AMBULATORY_CARE_PROVIDER_SITE_OTHER): Payer: Medicare Other | Admitting: Neurology

## 2019-08-17 VITALS — Ht 65.0 in | Wt 134.0 lb

## 2019-08-17 DIAGNOSIS — F0391 Unspecified dementia with behavioral disturbance: Secondary | ICD-10-CM | POA: Diagnosis not present

## 2019-08-17 DIAGNOSIS — F03B18 Unspecified dementia, moderate, with other behavioral disturbance: Secondary | ICD-10-CM

## 2019-08-17 MED ORDER — DIVALPROEX SODIUM ER 250 MG PO TB24: ORAL_TABLET | ORAL | 3 refills | Status: DC

## 2019-08-17 MED ORDER — DONEPEZIL HCL 10 MG PO TABS: ORAL_TABLET | ORAL | 3 refills | Status: DC

## 2019-08-17 NOTE — Progress Notes (Signed)
Virtual Visit via Video Note The purpose of this virtual visit is to provide medical care while limiting exposure to the novel coronavirus.    Consent was obtained for video visit:  Yes.   Answered questions that patient had about telehealth interaction:  Yes.   I discussed the limitations, risks, security and privacy concerns of performing an evaluation and management service by telemedicine. I also discussed with the patient that there may be a patient responsible charge related to this service. The patient expressed understanding and agreed to proceed.  Pt location: Home Physician Location: office Name of referring provider:  [name], MD I connected with [name] at patients initiation/request on 08/17/2019 at  3:30 PM EDT by video enabled telemedicine application and verified that I am speaking with the correct person using two identifiers. Pt MRN:  [id number] Pt DOB:  1927-08-06 Video Participants:  [name];  Larena Glassman (daughter)   History of Present Illness:  The patient was seen as a virtual video visit on 08/17/2019. She was last seen 7 months ago for moderate dementia, likely Lewy body dementia with significant hallucinations. Her daughter is present during the e-visit to provide additional information. She is on low dose Depakote ER 250mg  qhs and Donepezil 10mg  daily. [name] has not noticed significant hallucinations, there may be small things like seeing the dog in the bedroom when he is downstairs. No paranoia. She is able to put her clothes on, her daughter helps her in the tub and tells her to bathe. She has to be reminded to use her cane, grabbing on to things at home, using a wheelchair outside. She fell 2-3 months ago in the bathroom. She denies any headaches and states nothing hurts. Sleep and appetite are good.   HPI 12/06/2016: This is an 85 yo RH woman with a history of diabetes, hypertension, CAD, lung cancer, with worsening memory and  hallucinations. When asked about her memory, she states there are some things she remembers and some she does not. She lives with her daughter and states that she cooks (she does not). She denies misplacing things but daughter shakes her head. Her daughter administers medications, otherwise she would not take them. Her daughter states that memory changes started only around 2 months ago, prior to this it was "pretty good." She started having difficulties remembering the days of the week, asking her grandson why he was not in school, then a few minutes later asking him the same question. She used to fix her own medications, but over the past month, her daughter has to physically watch her take them. She used to live by herself but after 2 falls, her daughter has lived with her for the past 2 years. Daughter has always been in charge of bill payments. She stopped driving 5 years ago due to bad knees. She denied getting lost driving, her daughter states she can still give directions when someone else is driving.  Her daughter's main concern are the hallucinations. The patient thinks these are real and denies having any hallucinations. Her daughter cites multiple instances, these started in June but have been worsening. She would talk about people across the street or someone sitting outside, a dog across the street in the car. She told her daughter she is in trouble with the people she works with, that they were downstairs. She has seen a cat on the bed, or something under the bed, something eating her shoes. She would ask her daughter if the  man downstairs was gone. She said there was a green car in the driveway, which was actually a large tarp. She saw a little boy sitting up on the tree outside. She called her daughter "Momma" this morning. She denies any auditory hallucinations. Her daughter denies any personality changes, no paranoia associated with these hallucinations. The patient denies any headaches,  dizziness, diplopia, dysarthria, dysphagia, neck/back pain, focal numbness/tingling/weakness, bowel/bladder dysfunction. No anosmia, tremors, no falls. They deny any significant head injuries. No family history of dementia.     Current Outpatient Medications on File Prior to Visit  Medication Sig Dispense Refill   amLODipine (NORVASC) 5 MG tablet TAKE 1 TABLET BY MOUTH EVERYDAY AT BEDTIME 30 tablet 11   aspirin 81 MG tablet Take 81 mg by mouth daily.     atorvastatin (LIPITOR) 40 MG tablet Take 40 mg by mouth daily at 6 PM.      divalproex (DEPAKOTE ER) 250 MG 24 hr tablet Take 1 tablet every night 90 tablet 3   donepezil (ARICEPT) 10 MG tablet TAKE 1 TABLET BY MOUTH EVERY DAY 90 tablet 3   furosemide (LASIX) 40 MG tablet TAKE 1 TABLET (40 MG TOTAL) BY MOUTH EVERY OTHER DAY. 45 tablet 3   glipiZIDE (GLUCOTROL) 5 MG tablet Take 2.5 mg by mouth daily.     HYDROcodone-homatropine (HYCODAN) 5-1.5 MG/5ML syrup Take 5 mLs by mouth every 6 (six) hours as needed.     levalbuterol (XOPENEX HFA) 45 MCG/ACT inhaler INHALE 2 PUFFS EVERY 4-6 HRS AS NEEDED  3   metoprolol succinate (TOPROL-XL) 100 MG 24 hr tablet TAKE 1 TABLET (100 MG TOTAL) BY MOUTH DAILY. TAKE WITH OR IMMEDIATELY FOLLOWING A MEAL. 90 tablet 2   mirtazapine (REMERON) 15 MG tablet Take 15 mg by mouth at bedtime.     pantoprazole (PROTONIX) 40 MG tablet Take 40 mg by mouth daily.      potassium chloride SA (K-DUR) 20 MEQ tablet Take 1 tablet (20 mEq total) by mouth daily. 7 tablet 0   PROAIR HFA 108 (90 Base) MCG/ACT inhaler Inhale 2 puffs into the lungs every 6 (six) hours as needed for wheezing or shortness of breath.  6   No current facility-administered medications on file prior to visit.      Observations/Objective:   Vitals:   08/17/19 1516  Weight: 134 lb (60.8 kg)  Height: 5\' 5"  (1.651 m)   GEN:  The patient appears stated age and is in NAD.  Neurological examination: Patient is awake, alert, oriented to  person, state. No aphasia or dysarthria. Reduced fluency, follows simple commands. Remote and recent memory impaired. Able to name, difficulty with repetition. Cranial nerves: Extraocular movements intact with no nystagmus. No facial asymmetry. Motor: moves all extremities symmetrically, at least anti-gravity x 4. No incoordination on finger to nose testing.  MMSE - Mini Mental State Exam 08/17/2019 01/13/2019 06/02/2018  Not completed: - - -  Orientation to time 0 1 0  Orientation to Place 2 4 3   Registration 3 3 3   Attention/ Calculation 0 0 0  Recall 0 0 0  Language- name 2 objects 2 2 2   Language- repeat 0 0 1  Language- follow 3 step command 3 3 3   Language- read & follow direction 0 1 1  Write a sentence 0 1 1  Copy design 0 0 0  Total score 10 15 14     Assessment and Plan:   This is an 85 yo RH woman with a history of  diabetes, hypertension, CAD, lung cancer, and moderate dementia with behavioral disturbance. Her daughter reports symptoms started with hallucinations, suggestive of Lewy body dementia. MRI brain did not show any acute changes, there was moderate to severe chronic microvascular disease. MMSE today 10/30 (15/30 in January 2020). Hallucinations under control on low dose Depakote ER 250mg  qhs, continue current medications. Refills for Donepezil 10mg  daily sent. Continue 24/7 care. She does not drive. She will follow-up in 6 months and knows to call for any changes   Follow Up Instructions:   -I discussed the assessment and treatment plan with the patient/daughter. The patient/daughter were provided an opportunity to ask questions and all were answered. The patient/daughter agreed with the plan and demonstrated an understanding of the instructions.   The patient/daughter was advised to call back or seek an in-person evaluation if the symptoms worsen or if the condition fails to improve as anticipated.    Cameron Sprang, MD

## 2019-08-18 ENCOUNTER — Telehealth (INDEPENDENT_AMBULATORY_CARE_PROVIDER_SITE_OTHER): Payer: Medicare Other | Admitting: Cardiology

## 2019-08-18 ENCOUNTER — Encounter: Payer: Self-pay | Admitting: Cardiology

## 2019-08-18 VITALS — BP 108/75 | HR 93 | Temp 98.1°F | Ht 65.0 in | Wt 134.0 lb

## 2019-08-18 DIAGNOSIS — I700 Atherosclerosis of aorta: Secondary | ICD-10-CM | POA: Diagnosis not present

## 2019-08-18 DIAGNOSIS — I10 Essential (primary) hypertension: Secondary | ICD-10-CM

## 2019-08-18 DIAGNOSIS — I2583 Coronary atherosclerosis due to lipid rich plaque: Secondary | ICD-10-CM | POA: Diagnosis not present

## 2019-08-18 DIAGNOSIS — I2510 Atherosclerotic heart disease of native coronary artery without angina pectoris: Secondary | ICD-10-CM

## 2019-08-18 NOTE — Progress Notes (Signed)
Virtual Visit via Video Note   This visit type was conducted due to national recommendations for restrictions regarding the COVID-19 Pandemic (e.g. social distancing) in an effort to limit this patient's exposure and mitigate transmission in our community.  Due to her co-morbid illnesses, this patient is at least at moderate risk for complications without adequate follow up.  This format is felt to be most appropriate for this patient at this time.  All issues noted in this document were discussed and addressed.  A limited physical exam was performed with this format.  Please refer to the patient's chart for her consent to telehealth for [facility]   Date:  08/18/2019   ID:  Joan Hunter, DOB Mar 23, 1927, MRN [id number]  Patient Location: Home Provider Location: Office  PCP:  [name], MD  Cardiologist:  Candee Furbish, MD  Electrophysiologist:  None   Evaluation Performed:  Follow-Up Visit  Chief Complaint: Cardiomyopathy follow-up  History of Present Illness:    Joan Hunter is a 85 y.o. female with moderate coronary artery disease prior catheterization in 2009, prior idiopathic cardiomyopathy (EF was 15%) now resolved 55%, mitral regurgitation mild here for followup.  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 Dr. Tammi Klippel. Radiation only.   Dr. Melvyn Novas changed Coreg to Bystolic to bisoprolol, but she is now back on Toprol. COPD/bronchitis. Treating hiatal hernia with Protonix as well.   Continuing with atorvastatin, hyperlipidemia.  She experienced some dizziness when standing and her medications were previously reduced. This has improved.  Her daughter,Joan Hunter, helps her out significantly but she also make sure that she gets up and moves around adequately.   I encouraged her to use her walker. She has a split-level house. Concerned about stair use.   04/16/17-she has been  having some more deconditioning, or shortness of breath with activity. Her daughter still has to motivate her to get up she states. She denies any chest pain. No orthopnea. No edema. No increase in weight.   01/13/17-mild shortness of breath with activity, no significant change. She is trying to maintain her weight. Mild shortness of breath with activity, she is trying to maintain her weight. No significant change. Her heart rate seems to be increased today. Her daughter does not think that she has been taking her bisoprolol or amlodipine. Her blood pressure is quite elevated as well. Heart rate 116 sinus tach  04/21/18 -still having some sinus tachycardia despite metoprolol 50. We are increasing to 100. She has had some wheezing when walking. Mild cough. Has not seen Dr. Melvyn Novas in quite some time. She is seeing Dr. [name]. No syncope, no orthopnea, no PND, no bleeding.  08/04/2018-no chest pain fevers chills nausea vomiting syncope bleeding. Still encouraging motion, movement. Feels well.  11/03/2018-chief complaint follow-up for shortness of breath prior cardiomyopathy. Overall she been doing quite well. Still ambulating with walker. Here with her daughter. No chest pain fevers chills nausea vomiting syncope. Weight has been fairly stable.  02/04/2019-here for the follow-up of coronary artery disease prior cardiomyopathy, hyperlipidemia.No major changes today. She has a birthday to celebrate in 4 days. 89th. She sometimes states that she is confused in the morning. She did go to the oncologist recently, slight increase in lung mass. PET scan results pending. Overall no change in symptoms. Ankles slightly edematous. Left greater than right  05/05/2019-chief complaint prior cardiomyopathy follow-up.  Overall doing very well.  No shortness of breath, no chest  pain, no fevers, no orthopnea.  Her daughter Joan Hunter is helping to take care of her.  She is staying in the house.   Compliant with her medications.  No syncope.  08/18/2019- chief complaint follow-up cardiomyopathy.  Still doing well.  Joan Hunter is helping her out.  Reviewed Dr. Lew Dawes note.  Cancer is in remission.  The patient does not have symptoms concerning for COVID-19 infection (fever, chills, cough, or new shortness of breath).    Past Medical History:  Diagnosis Date  . Allergic rhinitis   . Arthritis   . CAD (coronary artery disease)    mod 2V CAD (60% LAD, 70-80% mid CX) by 01/2008 cath  . CHF (congestive heart failure) ([place])   . Dementia ([place])   . Diabetes mellitus    dx 10 plus yrs ago  . Dilated cardiomyopathy ([place])    now resolved, EF 60-65% echo 10/11/11 (Dr. Marlou Porch)  . Diverticulosis   . Hyperlipidemia   . Hypertension   . Hypokalemia 01/09/2017  . Lung cancer ([place])   . Lung mass   . MR (mitral regurgitation)   . Osteopenia    Past Surgical History:  Procedure Laterality Date  . ABDOMINAL HYSTERECTOMY    . APPENDECTOMY    . EYE SURGERY     bil cataracts  . KNEE ARTHROSCOPY     ?  left  knee  . TONSILLECTOMY       Current Meds  Medication Sig  . amLODipine (NORVASC) 5 MG tablet TAKE 1 TABLET BY MOUTH EVERYDAY AT BEDTIME  . aspirin 81 MG tablet Take 81 mg by mouth daily.  Marland Kitchen atorvastatin (LIPITOR) 40 MG tablet Take 40 mg by mouth daily at 6 PM.   . divalproex (DEPAKOTE ER) 250 MG 24 hr tablet Take 1 tablet every night  . donepezil (ARICEPT) 10 MG tablet TAKE 1 TABLET BY MOUTH EVERY DAY  . furosemide (LASIX) 40 MG tablet TAKE 1 TABLET (40 MG TOTAL) BY MOUTH EVERY OTHER DAY.  Marland Kitchen glipiZIDE (GLUCOTROL) 5 MG tablet Take 2.5 mg by mouth daily.  Marland Kitchen HYDROcodone-homatropine (HYCODAN) 5-1.5 MG/5ML syrup Take 5 mLs by mouth every 6 (six) hours as needed.  . levalbuterol (XOPENEX HFA) 45 MCG/ACT inhaler INHALE 2 PUFFS EVERY 4-6 HRS AS NEEDED  . metoprolol succinate (TOPROL-XL) 100 MG 24 hr tablet TAKE 1 TABLET (100 MG TOTAL) BY MOUTH DAILY. TAKE WITH OR IMMEDIATELY FOLLOWING A MEAL.   . mirtazapine (REMERON) 15 MG tablet Take 15 mg by mouth at bedtime.  . pantoprazole (PROTONIX) 40 MG tablet Take 40 mg by mouth daily.   . potassium chloride SA (K-DUR) 20 MEQ tablet Take 1 tablet (20 mEq total) by mouth daily.  Marland Kitchen PROAIR HFA 108 (90 Base) MCG/ACT inhaler Inhale 2 puffs into the lungs every 6 (six) hours as needed for wheezing or shortness of breath.     Allergies:   Moxifloxacin, Penicillins, and Prednisone   Social History   Tobacco Use  . Smoking status: Former Smoker    Packs/day: 0.50    Years: 5.00    Pack years: 2.50    Types: Cigarettes    Quit date: 06/15/1972    Years since quitting: 47.2  . Smokeless tobacco: Never Used  Substance Use Topics  . Alcohol use: No  . Drug use: No     Family Hx: The patient's family history includes Colon cancer in her mother; Heart disease in her brother.  ROS:   Please see the history of present illness.  No fevers chills nausea vomiting syncope All other systems reviewed and are negative.   Prior CV studies:   The following studies were reviewed today:  Recent EF normal  Labs/Other Tests and Data Reviewed:    EKG:    02/04/2019 was personally reviewed today and demonstrated:  Sinus tachycardia 107  Recent Labs: 07/08/2019: ALT 6; BUN 13; Creatinine 1.17; Hemoglobin 11.7; Platelet Count 192; Potassium 3.1; Sodium 142   Recent Lipid Panel Lab Results  Component Value Date/Time   CHOL 143 06/15/2012 09:43 AM   TRIG 83 06/15/2012 09:43 AM   HDL 62 06/15/2012 09:43 AM   CHOLHDL 2.3 06/15/2012 09:43 AM   LDLCALC 64 06/15/2012 09:43 AM    Wt Readings from Last 3 Encounters:  08/18/19 134 lb (60.8 kg)  08/17/19 134 lb (60.8 kg)  07/12/19 134 lb 0.1 oz (60.8 kg)     Objective:    Vital Signs:  BP 108/75   Pulse 93   Temp 98.1 F (36.7 C)   Ht 5\' 5"  (1.651 m)   Wt 134 lb (60.8 kg)   BMI 22.30 kg/m    VITAL SIGNS:  reviewed GEN:  no acute distress EYES:  sclerae anicteric, EOMI - Extraocular  Movements Intact RESPIRATORY:  normal respiratory effort, symmetric expansion SKIN:  no rash, lesions or ulcers. MUSCULOSKELETAL:  no obvious deformities. NEURO:  alert and oriented x 3, no obvious focal deficit PSYCH:  normal affect  ASSESSMENT & PLAN:    History of dilated cardiomyopathy - Ejection fraction previously 15%, now normal at 65%. Excellent. Back on Toprol because of tachycardia. Doing well on Toprol 100. We are going to keep this same.  Non-small cell cancer of right lung, stage IA - Radiation only  - Dr. Lovenia Kim signs of disease recurrence. Reviewed his office note from 01/09/18. Excellent. Notes reviewed. Continues to have this monitored.  - Had last radiation in March. Supposed to call again.   Pulmonary fibrosis - Dr. Melvyn Novas She has not seen him in quite some time. No recent exacerbations or issues.  Mitral regurgitation  - After cardiomyopathy resolved, mitral regurgitation also now mild. Doing well, functional MR previously, no changes. Continue to monitor, doing well currently with no shortness of breath.  No changes made  Left anterior fascicular block  - Conduction disorder. No changes, no syncope. If this were to deteriorate, pacemaker may be warranted in the future. For now she is doing great.  No syncope  Essential hypertension -Continue with Toprol-XL 100 mg. Heart rate improved bisoprolol was discontinued. Pulse has been about 90-100 previously.  Okay  Aortic atherosclerosis - Continue with secondary prevention. No changes. Continue with secondary risk factor prevention. Continue with good blood pressure control.  3 month follow up again. (Daughter requests).  COVID-19 Education: The signs and symptoms of COVID-19 were discussed with the patient and how to seek care for testing (follow up with PCP or arrange E-visit).  The importance of social distancing was discussed today.  Time:   Today, I have spent 15 minutes  with the patient with telehealth technology discussing the above problems.     Medication Adjustments/Labs and Tests Ordered: Current medicines are reviewed at length with the patient today.  Concerns regarding medicines are outlined above.   Tests Ordered: No orders of the defined types were placed in this encounter.   Medication Changes: No orders of the defined types were placed in this encounter.   Follow Up:  Virtual Visit in 3 month(s)  Signed, Elta Guadeloupe  Marlou Porch, MD  08/18/2019 3:23 PM    [place]

## 2019-08-18 NOTE — Patient Instructions (Signed)
Medication Instructions:   Your physician recommends that you continue on your current medications as directed. Please refer to the Current Medication list given to you today.  If you need a refill on your cardiac medications before your next appointment, please call your pharmacy.     Follow-Up:  AS ANOTHER VIRTUAL VISIT WITH DR. Marlou Porch IN 3 MONTHS ON Wednesday 11/17/2019 AT 9:40 AM

## 2019-11-17 ENCOUNTER — Other Ambulatory Visit: Payer: Self-pay

## 2019-11-17 ENCOUNTER — Telehealth: Payer: Medicare Other | Admitting: Cardiology

## 2019-12-02 ENCOUNTER — Other Ambulatory Visit: Payer: Self-pay | Admitting: Cardiology

## 2019-12-28 ENCOUNTER — Other Ambulatory Visit: Payer: Self-pay | Admitting: Cardiology

## 2019-12-28 ENCOUNTER — Telehealth: Payer: Self-pay | Admitting: Cardiology

## 2019-12-28 NOTE — Telephone Encounter (Signed)
Unsure of who would have called and there is no documentation regarding anyone calling.  Advised daughter the call was more than likely a reminder call about pt's upcoming appt 12/30/2019.  Did verify cell phone # and appt will be VV using [email or web address]  YOUR CARDIOLOGY TEAM HAS ARRANGED FOR AN E-VISIT FOR YOUR APPOINTMENT - PLEASE REVIEW IMPORTANT INFORMATION BELOW SEVERAL DAYS PRIOR TO YOUR APPOINTMENT  Due to the recent COVID-19 pandemic, we are transitioning in-person office visits to tele-medicine visits in an effort to decrease unnecessary exposure to our patients, their families, and staff. These visits are billed to your insurance just like a normal visit is. We also encourage you to sign up for MyChart if you have not already done so. You will need a smartphone if possible. For patients that do not have this, we can still complete the visit using a regular telephone but do prefer a smartphone to enable video when possible. You may have a family member that lives with you that can help. If possible, we also ask that you have a blood pressure cuff and scale at home to measure your blood pressure, heart rate and weight prior to your scheduled appointment. Patients with clinical needs that need an in-person evaluation and testing will still be able to come to the office if absolutely necessary. If you have any questions, feel free to call our office.  YOUR PROVIDER WILL BE USING THE FOLLOWING PLATFORM TO COMPLETE YOUR VISIT: [email or web address]  THE DAY OF YOUR APPOINTMENT  Approximately 15 minutes prior to your scheduled appointment, you will receive a telephone call from one of [facility] team - your caller ID may say "Unknown caller."  Our staff will confirm medications, vital signs for the day and any symptoms you may be experiencing. Please have this information available prior to the time of visit start. It may also be helpful for you to have a pad of paper and pen handy for any instructions given during your  visit. They will also walk you through joining the smartphone meeting if this is a video visit.    CONSENT FOR TELE-HEALTH VISIT - PLEASE REVIEW  I hereby voluntarily request, consent and authorize [facility] and its employed or contracted physicians, physician assistants, nurse practitioners or other licensed health care professionals (the Practitioner), to provide me with telemedicine health care services (the "Services") as deemed necessary by the treating Practitioner. I acknowledge and consent to receive the Services by the Practitioner via telemedicine. I understand that the telemedicine visit will involve communicating with the Practitioner through live audiovisual communication technology and the disclosure of certain medical information by electronic transmission. I acknowledge that I have been given the opportunity to request an in-person assessment or other available alternative prior to the telemedicine visit and am voluntarily participating in the telemedicine visit.  I understand that I have the right to withhold or withdraw my consent to the use of telemedicine in the course of my care at any time, without affecting my right to future care or treatment, and that the Practitioner or I may terminate the telemedicine visit at any time. I understand that I have the right to inspect all information obtained and/or recorded in the course of the telemedicine visit and may receive copies of available information for a reasonable fee.  I understand that some of the potential risks of receiving the Services via telemedicine include:  Marland Kitchen Delay or interruption in medical evaluation due to technological equipment failure or disruption; . Information transmitted may  not be sufficient (e.g. poor resolution of images) to allow for appropriate medical decision making by the Practitioner; and/or  . In rare instances, security protocols could fail, causing a breach of personal health  information.  Furthermore, I acknowledge that it is my responsibility to provide information about my medical history, conditions and care that is complete and accurate to the best of my ability. I acknowledge that Practitioner's advice, recommendations, and/or decision may be based on factors not within their control, such as incomplete or inaccurate data provided by me or distortions of diagnostic images or specimens that may result from electronic transmissions. I understand that the practice of medicine is not an exact science and that Practitioner makes no warranties or guarantees regarding treatment outcomes. I acknowledge that I will receive a copy of this consent concurrently upon execution via email to the email address I last provided but may also request a printed copy by calling the office of [facility].    I understand that my insurance will be billed for this visit.   I have read or had this consent read to me. . I understand the contents of this consent, which adequately explains the benefits and risks of the Services being provided via telemedicine.  . I have been provided ample opportunity to ask questions regarding this consent and the Services and have had my questions answered to my satisfaction. . I give my informed consent for the services to be provided through the use of telemedicine in my medical care  By participating in this telemedicine visit I agree to the above.

## 2019-12-28 NOTE — Telephone Encounter (Signed)
New Message    Joan Hunter is calling back, she says she missed a call     Please call back

## 2019-12-30 ENCOUNTER — Other Ambulatory Visit: Payer: Self-pay

## 2019-12-30 ENCOUNTER — Telehealth (INDEPENDENT_AMBULATORY_CARE_PROVIDER_SITE_OTHER): Payer: Medicare PPO | Admitting: Cardiology

## 2019-12-30 ENCOUNTER — Encounter: Payer: Self-pay | Admitting: Cardiology

## 2019-12-30 VITALS — BP 127/84 | HR 100 | Ht 65.0 in | Wt 133.4 lb

## 2019-12-30 DIAGNOSIS — I2583 Coronary atherosclerosis due to lipid rich plaque: Secondary | ICD-10-CM

## 2019-12-30 DIAGNOSIS — I340 Nonrheumatic mitral (valve) insufficiency: Secondary | ICD-10-CM

## 2019-12-30 DIAGNOSIS — I10 Essential (primary) hypertension: Secondary | ICD-10-CM

## 2019-12-30 DIAGNOSIS — I700 Atherosclerosis of aorta: Secondary | ICD-10-CM

## 2019-12-30 DIAGNOSIS — J8410 Pulmonary fibrosis, unspecified: Secondary | ICD-10-CM

## 2019-12-30 DIAGNOSIS — I2510 Atherosclerotic heart disease of native coronary artery without angina pectoris: Secondary | ICD-10-CM

## 2019-12-30 NOTE — Progress Notes (Signed)
Virtual Visit via Video Note   This visit type was conducted due to national recommendations for restrictions regarding the COVID-19 Pandemic (e.g. social distancing) in an effort to limit this patient's exposure and mitigate transmission in our community.  Due to her co-morbid illnesses, this patient is at least at moderate risk for complications without adequate follow up.  This format is felt to be most appropriate for this patient at this time.  All issues noted in this document were discussed and addressed.  A limited physical exam was performed with this format.  Please refer to the patient's chart for her consent to telehealth for [facility].   Date:  12/30/2019   ID:  Joan Hunter, DOB January 05, 1927, MRN [id number]  Patient Location: Home Provider Location: Office  PCP:  [name], MD  Cardiologist:  Candee Furbish, MD  Electrophysiologist:  None   Evaluation Performed:  Follow-Up Visit  Chief Complaint: Follow-up of cardiomyopathy  History of Present Illness:    Joan Hunter is a 86 y.o. 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 Dr. Tammi Klippel. Radiation only.   Dr. Melvyn Novas changed Coreg to Bystolic to bisoprolol, but she is now back on Toprol. COPD/bronchitis. Treating hiatal hernia with Protonix as well.   Continuing with atorvastatin, hyperlipidemia.  She experienced some dizziness when standing and her medications were previously reduced. This has improved.  Her daughter,[name], helps her out significantly but she also make sure that she gets up and moves around adequately.   I encouraged her to use her walker. She has a split-level house. Concerned about stair use.   04/16/17-she has  been having some more deconditioning, or shortness of breath with activity. Her daughter still has to motivate her to get up she states. She denies any chest pain. No orthopnea. No edema. No increase in weight.   01/13/17-mild shortness of breath with activity, no significant change. She is trying to maintain her weight. Mild shortness of breath with activity, she is trying to maintain her weight. No significant change. Her heart rate seems to be increased today. Her daughter does not think that she has been taking her bisoprolol or amlodipine. Her blood pressure is quite elevated as well. Heart rate 116 sinus tach  04/21/18 -still having some sinus tachycardia despite metoprolol 50. We are increasing to 100. She has had some wheezing when walking. Mild cough. Has not seen Dr. Melvyn Novas in quite some time. She is seeing Dr. [name]. No syncope, no orthopnea, no PND, no bleeding.  08/04/2018-no chest pain fevers chills nausea vomiting syncope bleeding. Still encouraging motion, movement. Feels well.  11/03/2018-chief complaint follow-up for shortness of breath prior cardiomyopathy. Overall she been doing quite well. Still ambulating with walker. Here with her daughter. No chest pain fevers chills nausea vomiting syncope. Weight has been fairly stable.  02/04/2019-here for the follow-up of coronary artery disease prior cardiomyopathy, hyperlipidemia.No major changes today. She has a birthday to celebrate in 4 days. 89th. She sometimes states that she is confused in the morning. She did go to the oncologist recently, slight increase in lung mass. PET scan results pending. Overall no change in symptoms. Ankles slightly edematous. Left greater than right  05/05/2019-chief complaint prior cardiomyopathy follow-up.  Overall doing very well.  No shortness of breath,  no chest pain, no fevers, no orthopnea.  Her daughter [name] is helping to take care of her.  She is staying in the house.   Compliant with her medications.  No syncope.  08/18/2019- chief complaint follow-up cardiomyopathy.  Still doing well.  [name] is helping her out.  Reviewed Dr. Lew Dawes note.  Cancer is in remission.  12/30/2019-here for follow-up of ischemic cardiomyopathy.  She is overall doing very well.  She is being safe with Covid.  No fevers chills nausea vomiting.  Discussed getting Covid vaccine for her.  Medications reviewed.  The patient does not have symptoms concerning for COVID-19 infection (fever, chills, cough, or new shortness of breath).    Past Medical History:  Diagnosis Date  . Allergic rhinitis   . Arthritis   . CAD (coronary artery disease)    mod 2V CAD (60% LAD, 70-80% mid CX) by 01/2008 cath  . CHF (congestive heart failure) ([place])   . Dementia ([place])   . Diabetes mellitus    dx 10 plus yrs ago  . Dilated cardiomyopathy ([name])    now resolved, EF 60-65% echo 10/11/11 (Dr. Marlou Porch)  . Diverticulosis   . Hyperlipidemia   . Hypertension   . Hypokalemia 01/09/2017  . Lung cancer ([place])   . Lung mass   . MR (mitral regurgitation)   . Osteopenia    Past Surgical History:  Procedure Laterality Date  . ABDOMINAL HYSTERECTOMY    . APPENDECTOMY    . EYE SURGERY     bil cataracts  . KNEE ARTHROSCOPY     ?  left  knee  . TONSILLECTOMY       Current Meds  Medication Sig  . amLODipine (NORVASC) 5 MG tablet TAKE 1 TABLET BY MOUTH EVERYDAY AT BEDTIME  . aspirin 81 MG tablet Take 81 mg by mouth daily.  Marland Kitchen atorvastatin (LIPITOR) 40 MG tablet Take 40 mg by mouth daily at 6 PM.   . divalproex (DEPAKOTE ER) 250 MG 24 hr tablet Take 1 tablet every night  . donepezil (ARICEPT) 10 MG tablet TAKE 1 TABLET BY MOUTH EVERY DAY  . furosemide (LASIX) 40 MG tablet TAKE 1 TABLET BY MOUTH EVERY OTHER DAY  . glipiZIDE (GLUCOTROL) 5 MG tablet Take 2.5 mg by mouth daily.  Marland Kitchen HYDROcodone-homatropine (HYCODAN) 5-1.5 MG/5ML syrup Take 5 mLs by mouth every 6 (six) hours as needed.  . levalbuterol  (XOPENEX HFA) 45 MCG/ACT inhaler INHALE 2 PUFFS EVERY 4-6 HRS AS NEEDED  . metoprolol succinate (TOPROL-XL) 100 MG 24 hr tablet TAKE 1 TABLET (100 MG TOTAL) BY MOUTH DAILY. TAKE WITH OR IMMEDIATELY FOLLOWING A MEAL.  . mirtazapine (REMERON) 15 MG tablet Take 15 mg by mouth at bedtime.  . pantoprazole (PROTONIX) 40 MG tablet Take 40 mg by mouth daily.   . potassium chloride SA (K-DUR) 20 MEQ tablet Take 1 tablet (20 mEq total) by mouth daily.  Marland Kitchen PROAIR HFA 108 (90 Base) MCG/ACT inhaler Inhale 2 puffs into the lungs every 6 (six) hours as needed for wheezing or shortness of breath.     Allergies:   Moxifloxacin, Penicillins, and Prednisone   Social History   Tobacco Use  . Smoking status: Former Smoker    Packs/day: 0.50    Years: 5.00    Pack years: 2.50    Types: Cigarettes    Quit date: 06/15/1972    Years since quitting: 47.5  . Smokeless tobacco: Never Used  Substance Use Topics  . Alcohol use: No  .  Drug use: No     Family Hx: The patient's family history includes Colon cancer in her mother; Heart disease in her brother.  ROS:   Please see the history of present illness.    No fevers chills nausea vomiting syncope All other systems reviewed and are negative.   Prior CV studies:   The following studies were reviewed today:  Recent EF normal  Labs/Other Tests and Data Reviewed:    EKG:    02/04/2019 was personally reviewed today and demonstrated:  Sinus tachycardia 107  Recent Labs: 07/08/2019: ALT 6; BUN 13; Creatinine 1.17; Hemoglobin 11.7; Platelet Count 192; Potassium 3.1; Sodium 142   Recent Lipid Panel Lab Results  Component Value Date/Time   CHOL 143 06/15/2012 09:43 AM   TRIG 83 06/15/2012 09:43 AM   HDL 62 06/15/2012 09:43 AM   CHOLHDL 2.3 06/15/2012 09:43 AM   LDLCALC 64 06/15/2012 09:43 AM    Wt Readings from Last 3 Encounters:  12/30/19 133 lb 6.4 oz (60.5 kg)  08/18/19 134 lb (60.8 kg)  08/17/19 134 lb (60.8 kg)     Objective:    Vital  Signs:  BP 127/84   Pulse 100   Ht 5\' 5"  (1.651 m)   Wt 133 lb 6.4 oz (60.5 kg)   BMI 22.20 kg/m    VITAL SIGNS:  reviewed GEN:  no acute distress EYES:  sclerae anicteric, EOMI - Extraocular Movements Intact RESPIRATORY:  normal respiratory effort, symmetric expansion SKIN:  no rash, lesions or ulcers. MUSCULOSKELETAL:  no obvious deformities. NEURO:  alert and oriented x 3, no obvious focal deficit PSYCH:  normal affect  ASSESSMENT & PLAN:    History of dilated cardiomyopathy - Ejection fraction previously 15%, now normal at 65%. Excellent. Back on Toprol because of tachycardia. Doing well on Toprol 100. We are going to keep this same.  Non-small cell cancer of right lung, stage IA - Radiation only  - Dr. Lovenia Kim signs of disease recurrence. Reviewed his office note from 01/09/18. Excellent. Notes reviewed. Continues to have this monitored.  -Prior radiation.  Overall doing well.  Pulmonary fibrosis - Dr. Melvyn Novas She has not seen him in quite some time. No recent exacerbations or issues.  Stable  Mitral regurgitation  - After cardiomyopathy resolved, mitral regurgitation also now mild. Doing well, functional MR previously, no changes. Continue to monitor, doing well currently with no shortness of breath.  No changes made stable  Left anterior fascicular block  - Conduction disorder. No changes, no syncope. If this were to deteriorate, pacemaker may be warranted in the future. For now she is doing great.  No syncope, doing well.  Next time she is in the office we will check an EKG.  Essential hypertension -Continue with Toprol-XL 100 mg. Heart rate improved bisoprolol was discontinued. Pulse has been about 90-100 previously.  Okay, should not be of clinical concern.  Aortic atherosclerosis -Continue with secondary prevention efforts including atorvastatin aspirin  3 month follow up again virtual video. (Daughter requests).  COVID-19  Education: The signs and symptoms of COVID-19 were discussed with the patient and how to seek care for testing (follow up with PCP or arrange E-visit).  The importance of social distancing was discussed today.  Time:   Today, I have spent 15 minutes with the patient with telehealth technology discussing the above problems.     Medication Adjustments/Labs and Tests Ordered: Current medicines are reviewed at length with the patient today.  Concerns regarding medicines are outlined above.  Tests Ordered: No orders of the defined types were placed in this encounter.   Medication Changes: No orders of the defined types were placed in this encounter.   Follow Up:  Virtual Visit in 3 month(s)  Signed, Candee Furbish, MD  12/30/2019 10:33 AM    [place]

## 2019-12-30 NOTE — Patient Instructions (Signed)
Medication Instructions:   Your physician recommends that you continue on your current medications as directed. Please refer to the Current Medication list given to you today.  *If you need a refill on your cardiac medications before your next appointment, please call your pharmacy*    Follow-Up: At [facility], you and your health needs are our priority.  As part of our continuing mission to provide you with exceptional heart care, we have created designated Provider Care Teams.  These Care Teams include your primary Cardiologist (physician) and Advanced Practice Providers (APPs -  Physician Assistants and Nurse Practitioners) who all work together to provide you with the care you need, when you need it.  Your next appointment:   3 month(s)- ON April 14. 2021 AT 9:40 AM WITH DR. Marlou Porch AS A VIDEO VISIT--WE WILL CALL YOUR DAUGHTER [place] ON HER CELL PHONE, JUST LIKE TODAY.    The format for your next appointment:   Virtual Visit -VIDEO VISIT  Provider:   Candee Furbish, MD

## 2020-02-11 ENCOUNTER — Other Ambulatory Visit: Payer: Self-pay | Admitting: Cardiology

## 2020-02-22 DIAGNOSIS — L603 Nail dystrophy: Secondary | ICD-10-CM | POA: Diagnosis not present

## 2020-02-22 DIAGNOSIS — L84 Corns and callosities: Secondary | ICD-10-CM | POA: Diagnosis not present

## 2020-02-22 DIAGNOSIS — E1151 Type 2 diabetes mellitus with diabetic peripheral angiopathy without gangrene: Secondary | ICD-10-CM | POA: Diagnosis not present

## 2020-02-22 DIAGNOSIS — I739 Peripheral vascular disease, unspecified: Secondary | ICD-10-CM | POA: Diagnosis not present

## 2020-03-25 ENCOUNTER — Other Ambulatory Visit: Payer: Self-pay | Admitting: Cardiology

## 2020-03-27 ENCOUNTER — Other Ambulatory Visit: Payer: Self-pay

## 2020-03-27 ENCOUNTER — Encounter: Payer: Self-pay | Admitting: Neurology

## 2020-03-27 ENCOUNTER — Ambulatory Visit (INDEPENDENT_AMBULATORY_CARE_PROVIDER_SITE_OTHER): Payer: Medicare PPO | Admitting: Neurology

## 2020-03-27 DIAGNOSIS — F03B18 Unspecified dementia, moderate, with other behavioral disturbance: Secondary | ICD-10-CM

## 2020-03-27 DIAGNOSIS — F0391 Unspecified dementia with behavioral disturbance: Secondary | ICD-10-CM | POA: Diagnosis not present

## 2020-03-27 MED ORDER — DONEPEZIL HCL 10 MG PO TABS: ORAL_TABLET | ORAL | 3 refills | Status: DC

## 2020-03-27 MED ORDER — DIVALPROEX SODIUM ER 250 MG PO TB24: ORAL_TABLET | ORAL | 3 refills | Status: DC

## 2020-03-27 NOTE — Patient Instructions (Signed)
Good seeing you! Continue all your medications. Follow-up in 6 months, call for any changes  FALL PRECAUTIONS: Be cautious when walking. Scan the area for obstacles that may increase the risk of trips and falls. When getting up in the mornings, sit up at the edge of the bed for a few minutes before getting out of bed. Consider elevating the bed at the head end to avoid drop of blood pressure when getting up. Walk always in a well-lit room (use night lights in the walls). Avoid area rugs or power cords from appliances in the middle of the walkways. Use a walker or a cane if necessary and consider physical therapy for balance exercise. Get your eyesight checked regularly.   HOME SAFETY: Consider the safety of the kitchen when operating appliances like stoves, microwave oven, and blender. Consider having supervision and share cooking responsibilities until no longer able to participate in those. Accidents with firearms and other hazards in the house should be identified and addressed as well.   ABILITY TO BE LEFT ALONE: If patient is unable to contact 911 operator, consider using LifeLine, or when the need is there, arrange for someone to stay with patients. Smoking is a fire hazard, consider supervision or cessation. Risk of wandering should be assessed by caregiver and if detected at any point, supervision and safe proof recommendations should be instituted.   RECOMMENDATIONS FOR ALL PATIENTS WITH MEMORY PROBLEMS: 1. Continue to exercise (Recommend 30 minutes of walking everyday, or 3 hours every week) 2. Increase social interactions - continue going to [place] and enjoy social gatherings with friends and family 3. Eat healthy, avoid fried foods and eat more fruits and vegetables 4. Maintain adequate blood pressure, blood sugar, and blood cholesterol level. Reducing the risk of stroke and cardiovascular disease also helps promoting better memory. 5. Avoid stressful situations. Live a simple life and  avoid aggravations. Organize your time and prepare for the next day in anticipation. 6. Sleep well, avoid any interruptions of sleep and avoid any distractions in the bedroom that may interfere with adequate sleep quality 7. Avoid sugar, avoid sweets as there is a strong link between excessive sugar intake, diabetes, and cognitive impairment The Mediterranean diet has been shown to help patients reduce the risk of progressive memory disorders and reduces cardiovascular risk. This includes eating fish, eat fruits and green leafy vegetables, nuts like almonds and hazelnuts, walnuts, and also use olive oil. Avoid fast foods and fried foods as much as possible. Avoid sweets and sugar as sugar use has been linked to worsening of memory function.  There is always a concern of gradual progression of memory problems. If this is the case, then we may need to adjust level of care according to patient needs. Support, both to the patient and caregiver, should then be put into place.

## 2020-03-27 NOTE — Progress Notes (Signed)
NEUROLOGY FOLLOW UP OFFICE NOTE  Joan Hunter [id number] 1927/05/10  HISTORY OF PRESENT ILLNESS: I had the pleasure of seeing Joan Hunter in follow-up in the neurology clinic on 03/27/2020.  The patient was last seen 7 months ago for moderate dementia, likely Lewy body dementia with significant hallucinations on initial presentation. She is accompanied by her daughter who helps supplement the history today. MMSE 10/30 in September 2020. She is on Donepezil 10mg  daily. She had initial good response to low dose Depakote ER 250mg  qhs for hallucinations, her daughter reports that she still has them, saying the dog is in the other room when it is not, or that her daughter was up at night drinking alcohol. Her daughter manages finances, medications, and meals. She can dress herself but needs help with bathing. She refuses things sometimes, wanting to eat a sandwich at 2-3am, then refusing it once fixed. She likes fruit. Sometimes she is up at night and asleep during the day. She denies any headaches, dizziness, vision changes, no falls. She ambulates with a cane at home.   History on Initial Assessment 12/06/2016: This is an 86 yo RH woman with a history of diabetes, hypertension, CAD, lung cancer, with worsening memory and hallucinations. When asked about her memory, she states there are some things she remembers and some she does not. She lives with her daughter and states that she cooks (she does not). She denies misplacing things but daughter shakes her head. Her daughter administers medications, otherwise she would not take them. Her daughter states that memory changes started only around 2 months ago, prior to this it was "pretty good." She started having difficulties remembering the days of the week, asking her grandson why he was not in school, then a few minutes later asking him the same question. She used to fix her own medications, but over the past month, her daughter has to physically  watch her take them. She used to live by herself but after 2 falls, her daughter has lived with her for the past 2 years. Daughter has always been in charge of bill payments. She stopped driving 5 years ago due to bad knees. She denied getting lost driving, her daughter states she can still give directions when someone else is driving.  Her daughter's main concern are the hallucinations. The patient thinks these are real and denies having any hallucinations. Her daughter cites multiple instances, these started in June but have been worsening. She would talk about people across the street or someone sitting outside, a dog across the street in the car. She told her daughter she is in trouble with the people she works with, that they were downstairs. She has seen a cat on the bed, or something under the bed, something eating her shoes. She would ask her daughter if the man downstairs was gone. She said there was a green car in the driveway, which was actually a large tarp. She saw a little boy sitting up on the tree outside. She called her daughter "Momma" this morning. She denies any auditory hallucinations. Her daughter denies any personality changes, no paranoia associated with these hallucinations. The patient denies any headaches, dizziness, diplopia, dysarthria, dysphagia, neck/back pain, focal numbness/tingling/weakness, bowel/bladder dysfunction. No anosmia, tremors, no falls. They deny any significant head injuries. No family history of dementia.   PAST MEDICAL HISTORY: Past Medical History:  Diagnosis Date  . Allergic rhinitis   . Arthritis   . CAD (coronary artery disease)    mod  2V CAD (60% LAD, 70-80% mid CX) by 01/2008 cath  . CHF (congestive heart failure) ([place])   . Dementia ([place])   . Diabetes mellitus    dx 10 plus yrs ago  . Dilated cardiomyopathy ([place])    now resolved, EF 60-65% echo 10/11/11 (Dr. Marlou Porch)  . Diverticulosis   . Hyperlipidemia   . Hypertension   . Hypokalemia  01/09/2017  . Lung cancer ([place])   . Lung mass   . MR (mitral regurgitation)   . Osteopenia     MEDICATIONS: Current Outpatient Medications on File Prior to Visit  Medication Sig Dispense Refill  . amLODipine (NORVASC) 5 MG tablet TAKE 1 TABLET BY MOUTH EVERYDAY AT BEDTIME 90 tablet 2  . aspirin 81 MG tablet Take 81 mg by mouth daily.    Marland Kitchen atorvastatin (LIPITOR) 40 MG tablet Take 40 mg by mouth daily at 6 PM.     . divalproex (DEPAKOTE ER) 250 MG 24 hr tablet Take 1 tablet every night 90 tablet 3  . donepezil (ARICEPT) 10 MG tablet TAKE 1 TABLET BY MOUTH EVERY DAY 90 tablet 3  . furosemide (LASIX) 40 MG tablet TAKE 1 TABLET BY MOUTH EVERY OTHER DAY 45 tablet 2  . glipiZIDE (GLUCOTROL) 5 MG tablet Take 2.5 mg by mouth daily.    Marland Kitchen HYDROcodone-homatropine (HYCODAN) 5-1.5 MG/5ML syrup Take 5 mLs by mouth every 6 (six) hours as needed.    . levalbuterol (XOPENEX HFA) 45 MCG/ACT inhaler INHALE 2 PUFFS EVERY 4-6 HRS AS NEEDED  3  . metoprolol succinate (TOPROL-XL) 100 MG 24 hr tablet TAKE 1 TABLET BY MOUTH DAILY. TAKE WITH OR IMMEDIATELY FOLLOWING A MEAL. 90 tablet 3  . mirtazapine (REMERON) 15 MG tablet Take 15 mg by mouth at bedtime.    . pantoprazole (PROTONIX) 40 MG tablet Take 40 mg by mouth daily.     . potassium chloride SA (K-DUR) 20 MEQ tablet Take 1 tablet (20 mEq total) by mouth daily. 7 tablet 0  . PROAIR HFA 108 (90 Base) MCG/ACT inhaler Inhale 2 puffs into the lungs every 6 (six) hours as needed for wheezing or shortness of breath.  6   No current facility-administered medications on file prior to visit.    ALLERGIES: Allergies  Allergen Reactions  . Moxifloxacin     REACTION: hives  . Penicillins     REACTION: hives  . Prednisone     REACTION: hives    FAMILY HISTORY: Family History  Problem Relation Age of Onset  . Colon cancer Mother   . Heart disease Brother     SOCIAL HISTORY: Social History   Socioeconomic History  . Marital status: Widowed    Spouse  name: Not on file  . Number of children: Not on file  . Years of education: Not on file  . Highest education level: Not on file  Occupational History  . Not on file  Tobacco Use  . Smoking status: Former Smoker    Packs/day: 0.50    Years: 5.00    Pack years: 2.50    Types: Cigarettes    Quit date: 06/15/1972    Years since quitting: 47.8  . Smokeless tobacco: Never Used  Substance and Sexual Activity  . Alcohol use: No  . Drug use: No  . Sexual activity: Not Currently  Other Topics Concern  . Not on file  Social History Narrative   Daughter resides with patient and is her primary care giver.      12th  grade      Right handed      Social Determinants of Health   Financial Resource Strain:   . Difficulty of Paying Living Expenses:   Food Insecurity:   . Worried About Charity fundraiser in the Last Year:   . Arboriculturist in the Last Year:   Transportation Needs:   . Film/video editor (Medical):   Marland Kitchen Lack of Transportation (Non-Medical):   Physical Activity:   . Days of Exercise per Week:   . Minutes of Exercise per Session:   Stress:   . Feeling of Stress :   Social Connections:   . Frequency of Communication with Friends and Family:   . Frequency of Social Gatherings with Friends and Family:   . Attends Religious Services:   . Active Member of Clubs or Organizations:   . Attends Archivist Meetings:   Marland Kitchen Marital Status:   Intimate Partner Violence:   . Fear of Current or Ex-Partner:   . Emotionally Abused:   Marland Kitchen Physically Abused:   . Sexually Abused:     REVIEW OF SYSTEMS: Constitutional: No fevers, chills, or sweats, no generalized fatigue, change in appetite Eyes: No visual changes, double vision, eye pain Ear, nose and throat: No hearing loss, ear pain, nasal congestion, sore throat Cardiovascular: No chest pain, palpitations Respiratory:  No shortness of breath at rest or with exertion, wheezes GastrointestinaI: No nausea, vomiting,  diarrhea, abdominal pain, fecal incontinence Genitourinary:  No dysuria, urinary retention or frequency Musculoskeletal:  No neck pain, back pain Integumentary: No rash, pruritus, skin lesions Neurological: as above Psychiatric: No depression, insomnia, anxiety Endocrine: No palpitations, fatigue, diaphoresis, mood swings, change in appetite, change in weight, increased thirst Hematologic/Lymphatic:  No anemia, purpura, petechiae. Allergic/Immunologic: no itchy/runny eyes, nasal congestion, recent allergic reactions, rashes  PHYSICAL EXAM: Vitals:   03/27/20 1458  BP: 130/85  Pulse: 98  SpO2: 95%   General: No acute distress, sitting on wheelchair Head:  Normocephalic/atraumatic Skin/Extremities: No rash, no edema Neurological Exam: alert and oriented to person, place, does not know month, says it is not April or 2021. No aphasia or dysarthria. Fund of knowledge is reduced. Recent and remote memory are impaired. Attention and concentration are reduced. Cranial nerves: Pupils equal, round, reactive to light.  Extraocular movements intact with no nystagmus. Visual fields full. No facial asymmetry. Motor: Bulk and tone normal, muscle strength 5/5 throughout with no pronator drift.  Finger to nose testing intact.  Gait not tested, sitting in wheelchair, usually ambulates with cane at home.   IMPRESSION: This is an 86 yo RH woman with a history of diabetes, hypertension, CAD, lung cancer, and moderate dementia with behavioral disturbance. Initial presentation with hallucinations, suggestive of Lewy body dementia. MRI brain did not show any acute changes, there was moderate to severe chronic microvascular disease. MMSE 10/30 in 08/2019. She is on Donepezil 10mg  daily. Depakote ER 250mg  qhs helps with hallucinations, however she continues to have them, we have agreed to hold off on increasing dose for now, weighing benefits and side effects at her age. Continue 24/7 care. She will follow-up in 6  months and knows to call for any changes    Ellouise Newer, M.D.   CC: Dr. [name]

## 2020-03-29 ENCOUNTER — Encounter: Payer: Self-pay | Admitting: Cardiology

## 2020-03-29 ENCOUNTER — Other Ambulatory Visit: Payer: Self-pay

## 2020-03-29 ENCOUNTER — Telehealth (INDEPENDENT_AMBULATORY_CARE_PROVIDER_SITE_OTHER): Payer: Medicare PPO | Admitting: Cardiology

## 2020-03-29 VITALS — BP 137/95 | HR 94 | Ht 65.0 in | Wt 131.4 lb

## 2020-03-29 DIAGNOSIS — I700 Atherosclerosis of aorta: Secondary | ICD-10-CM

## 2020-03-29 DIAGNOSIS — I2510 Atherosclerotic heart disease of native coronary artery without angina pectoris: Secondary | ICD-10-CM | POA: Diagnosis not present

## 2020-03-29 DIAGNOSIS — I10 Essential (primary) hypertension: Secondary | ICD-10-CM

## 2020-03-29 DIAGNOSIS — I2583 Coronary atherosclerosis due to lipid rich plaque: Secondary | ICD-10-CM | POA: Diagnosis not present

## 2020-03-29 DIAGNOSIS — I340 Nonrheumatic mitral (valve) insufficiency: Secondary | ICD-10-CM

## 2020-03-29 NOTE — Patient Instructions (Signed)
Medication Instructions:  The current medical regimen is effective;  continue present plan and medications.  *If you need a refill on your cardiac medications before your next appointment, please call your pharmacy*  Follow-Up: At [facility], you and your health needs are our priority.  As part of our continuing mission to provide you with exceptional heart care, we have created designated Provider Care Teams.  These Care Teams include your primary Cardiologist (physician) and Advanced Practice Providers (APPs -  Physician Assistants and Nurse Practitioners) who all work together to provide you with the care you need, when you need it.  We recommend signing up for the patient portal called "MyChart".  Sign up information is provided on this After Visit Summary.  MyChart is used to connect with patients for Virtual Visits (Telemedicine).  Patients are able to view lab/test results, encounter notes, upcoming appointments, etc.  Non-urgent messages can be sent to your provider as well.   To learn more about what you can do with MyChart, go to [email or web address].    Your next appointment:   4 month(s)  The format for your next appointment:   In Person  Provider:   Candee Furbish, MD   Thank you for choosing [facility]!!

## 2020-03-29 NOTE — Progress Notes (Signed)
Virtual Visit via Video Note   This visit type was conducted due to national recommendations for restrictions regarding the COVID-19 Pandemic (e.g. social distancing) in an effort to limit this patient's exposure and mitigate transmission in our community.  Due to her co-morbid illnesses, this patient is at least at moderate risk for complications without adequate follow up.  This format is felt to be most appropriate for this patient at this time.  All issues noted in this document were discussed and addressed.  A limited physical exam was performed with this format.  Please refer to the patient's chart for her consent to telehealth for [facility].   Date:  03/29/2020   ID:  Joan Hunter, DOB 21-May-1927, MRN [id number]  Patient Location: Home Provider Location: Office  PCP:  [name], MD  Cardiologist:  Candee Furbish, MD  Electrophysiologist:  None   Evaluation Performed:  Follow-Up Visit  Chief Complaint: Follow-up of cardiomyopathy  History of Present Illness:    Joan Hunter is a 86 y.o. 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 Dr. Tammi Klippel. Radiation only.   Dr. Melvyn Novas changed Coreg to Bystolic to bisoprolol, but she is now back on Toprol. COPD/bronchitis. Treating hiatal hernia with Protonix as well.   Continuing with atorvastatin, hyperlipidemia.  She experienced some dizziness when standing and her medications were previously reduced. This has improved.  Her daughter,[name], helps her out significantly but she also make sure that she gets up and moves around adequately.   I encouraged her to use her walker. She has a split-level house. Concerned about stair use.   04/16/17-she has  been having some more deconditioning, or shortness of breath with activity. Her daughter still has to motivate her to get up she states. She denies any chest pain. No orthopnea. No edema. No increase in weight.   01/13/17-mild shortness of breath with activity, no significant change. She is trying to maintain her weight. Mild shortness of breath with activity, she is trying to maintain her weight. No significant change. Her heart rate seems to be increased today. Her daughter does not think that she has been taking her bisoprolol or amlodipine. Her blood pressure is quite elevated as well. Heart rate 116 sinus tach  04/21/18 -still having some sinus tachycardia despite metoprolol 50. We are increasing to 100. She has had some wheezing when walking. Mild cough. Has not seen Dr. Melvyn Novas in quite some time. She is seeing Dr. [name]. No syncope, no orthopnea, no PND, no bleeding.  08/04/2018-no chest pain fevers chills nausea vomiting syncope bleeding. Still encouraging motion, movement. Feels well.  11/03/2018-chief complaint follow-up for shortness of breath prior cardiomyopathy. Overall she been doing quite well. Still ambulating with walker. Here with her daughter. No chest pain fevers chills nausea vomiting syncope. Weight has been fairly stable.  02/04/2019-here for the follow-up of coronary artery disease prior cardiomyopathy, hyperlipidemia.No major changes today. She has a birthday to celebrate in 4 days. 89th. She sometimes states that she is confused in the morning. She did go to the oncologist recently, slight increase in lung mass. PET scan results pending. Overall no change in symptoms. Ankles slightly edematous. Left greater than right  05/05/2019-chief complaint prior cardiomyopathy follow-up.  Overall doing very well.  No shortness of breath,  no chest pain, no fevers, no orthopnea.  Her daughter [name] is helping to take care of her.  She is staying in the house.   Compliant with her medications.  No syncope.  08/18/2019- chief complaint follow-up cardiomyopathy.  Still doing well.  [name] is helping her out.  Reviewed Dr. Lew Dawes note.  Cancer is in remission.  12/30/2019-here for follow-up of ischemic cardiomyopathy.  She is overall doing very well.  She is being safe with Covid.  No fevers chills nausea vomiting.  Discussed getting Covid vaccine for her.  Medications reviewed.  03/29/20 - here for follow up cardiomyopathy.  Overall still doing well, NYHA class I-II symptoms.  Has recently seen Dr. Delice Lesch with neurology.  Note reviewed.  Moderate memory impairment.  No orthopnea no PND no chest pain.  The patient does not have symptoms concerning for COVID-19 infection (fever, chills, cough, or new shortness of breath).    Past Medical History:  Diagnosis Date  . Allergic rhinitis   . Arthritis   . CAD (coronary artery disease)    mod 2V CAD (60% LAD, 70-80% mid CX) by 01/2008 cath  . CHF (congestive heart failure) ([place])   . Dementia ([place])   . Diabetes mellitus    dx 10 plus yrs ago  . Dilated cardiomyopathy ([place])    now resolved, EF 60-65% echo 10/11/11 (Dr. Marlou Porch)  . Diverticulosis   . Hyperlipidemia   . Hypertension   . Hypokalemia 01/09/2017  . Lung cancer ([place])   . Lung mass   . MR (mitral regurgitation)   . Osteopenia    Past Surgical History:  Procedure Laterality Date  . ABDOMINAL HYSTERECTOMY    . APPENDECTOMY    . EYE SURGERY     bil cataracts  . KNEE ARTHROSCOPY     ?  left  knee  . TONSILLECTOMY       Current Meds  Medication Sig  . amLODipine (NORVASC) 5 MG tablet TAKE 1 TABLET BY MOUTH EVERYDAY AT BEDTIME  . aspirin 81 MG tablet Take 81 mg by mouth daily.  Marland Kitchen atorvastatin (LIPITOR) 40 MG tablet Take 40 mg by mouth daily at 6 PM.   . divalproex (DEPAKOTE ER) 250 MG 24 hr tablet Take 1 tablet every night  . donepezil (ARICEPT) 10 MG tablet TAKE 1 TABLET BY MOUTH EVERY DAY  . furosemide (LASIX) 40 MG tablet TAKE 1  TABLET BY MOUTH EVERY OTHER DAY  . levalbuterol (XOPENEX HFA) 45 MCG/ACT inhaler INHALE 2 PUFFS EVERY 4-6 HRS AS NEEDED  . metoprolol succinate (TOPROL-XL) 100 MG 24 hr tablet TAKE 1 TABLET BY MOUTH DAILY. TAKE WITH OR IMMEDIATELY FOLLOWING A MEAL.  . mirtazapine (REMERON) 15 MG tablet Take 15 mg by mouth at bedtime.  . pantoprazole (PROTONIX) 40 MG tablet Take 40 mg by mouth daily.   . potassium chloride SA (K-DUR) 20 MEQ tablet Take 1 tablet (20 mEq total) by mouth daily.  Marland Kitchen PROAIR HFA 108 (90 Base) MCG/ACT inhaler Inhale 2 puffs into the lungs every 6 (six) hours as needed for wheezing or shortness of breath.  . [DISCONTINUED] HYDROcodone-homatropine (HYCODAN) 5-1.5 MG/5ML syrup Take 5 mLs by mouth every 6 (six) hours as needed.     Allergies:   Moxifloxacin, Penicillins, and Prednisone   Social History   Tobacco Use  . Smoking status: Former Smoker    Packs/day: 0.50    Years: 5.00    Pack years: 2.50    Types: Cigarettes    Quit date:  06/15/1972    Years since quitting: 47.8  . Smokeless tobacco: Never Used  Substance Use Topics  . Alcohol use: No  . Drug use: No     Family Hx: The patient's family history includes Colon cancer in her mother; Heart disease in her brother.  ROS:   Please see the history of present illness.    No fevers chills nausea vomiting syncope All other systems reviewed and are negative.   Prior CV studies:   The following studies were reviewed today:  Recent EF normal  Labs/Other Tests and Data Reviewed:    EKG:    02/04/2019 was personally reviewed today and demonstrated:  Sinus tachycardia 107  Recent Labs: 07/08/2019: ALT 6; BUN 13; Creatinine 1.17; Hemoglobin 11.7; Platelet Count 192; Potassium 3.1; Sodium 142   Recent Lipid Panel Lab Results  Component Value Date/Time   CHOL 143 06/15/2012 09:43 AM   TRIG 83 06/15/2012 09:43 AM   HDL 62 06/15/2012 09:43 AM   CHOLHDL 2.3 06/15/2012 09:43 AM   LDLCALC 64 06/15/2012 09:43 AM    Wt  Readings from Last 3 Encounters:  03/29/20 131 lb 6.4 oz (59.6 kg)  03/27/20 131 lb 6.4 oz (59.6 kg)  12/30/19 133 lb 6.4 oz (60.5 kg)     Objective:    Vital Signs:  BP (!) 137/95   Pulse 94   Ht 5\' 5"  (1.651 m)   Wt 131 lb 6.4 oz (59.6 kg)   SpO2 93%   BMI 21.87 kg/m    VITAL SIGNS:  reviewed GEN:  no acute distress EYES:  sclerae anicteric, EOMI - Extraocular Movements Intact RESPIRATORY:  normal respiratory effort, symmetric expansion SKIN:  no rash, lesions or ulcers. MUSCULOSKELETAL:  no obvious deformities. NEURO:  alert and oriented x 3, no obvious focal deficit PSYCH:  normal affect  ASSESSMENT & PLAN:    History of dilated cardiomyopathy - Ejection fraction previously 15%, now normal at 65%. Excellent. Back on Toprol because of tachycardia. Doing well on Toprol 100.  No changes made today.  Non-small cell cancer of right lung, stage IA - Radiation only  - Dr. Lovenia Kim signs of disease recurrence. Reviewed his office note from 01/09/18. Excellent. Notes reviewed. Continues to have this monitored.  -Prior radiation.  She has been in remission  Pulmonary fibrosis - Dr. Melvyn Novas She has not seen him in quite some time. No recent exacerbations or issues.  Stable thankfully  Mitral regurgitation  - After cardiomyopathy resolved, mitral regurgitation also now mild. Doing well, functional MR previously, no changes. Continue to monitor, doing well currently with no shortness of breath.  Stable on last echocardiogram  Left anterior fascicular block  - Conduction disorder. No changes, no syncope. If this were to deteriorate, pacemaker may be warranted in the future. For now she is doing great.  No syncope, doing well.  Next time she is in the office we will check an EKG. we will get to see her back here in 4 months  Essential hypertension -Continue with Toprol-XL 100 mg. Heart rate improved bisoprolol was discontinued. Pulse has been about 90-100  previously.  Okay, should not be of clinical concern.  Aortic atherosclerosis -Continue with secondary prevention efforts including atorvastatin aspirin, excellent  4 month follow up again in office this time. (Daughter requests).  COVID-19 Education: The signs and symptoms of COVID-19 were discussed with the patient and how to seek care for testing (follow up with PCP or arrange E-visit).  The importance of social distancing  was discussed today.  Time:   Today, I have spent 15 minutes with the patient with telehealth technology discussing the above problems.     Medication Adjustments/Labs and Tests Ordered: Current medicines are reviewed at length with the patient today.  Concerns regarding medicines are outlined above.   Tests Ordered: No orders of the defined types were placed in this encounter.   Medication Changes: No orders of the defined types were placed in this encounter.   Follow Up:  Virtual Visit in 3 month(s)  Signed, Candee Furbish, MD  03/29/2020 11:09 AM    [place]

## 2020-07-10 ENCOUNTER — Encounter (HOSPITAL_COMMUNITY): Payer: Self-pay

## 2020-07-10 ENCOUNTER — Other Ambulatory Visit: Payer: Self-pay

## 2020-07-10 ENCOUNTER — Ambulatory Visit (HOSPITAL_COMMUNITY)
Admission: RE | Admit: 2020-07-10 | Discharge: 2020-07-10 | Disposition: A | Discharge status: Home / Self Care | Payer: Medicare PPO | Source: Ambulatory Visit | Attending: Internal Medicine | Admitting: Internal Medicine

## 2020-07-10 ENCOUNTER — Inpatient Hospital Stay: Payer: Medicare PPO | Attending: Internal Medicine

## 2020-07-10 DIAGNOSIS — Z85118 Personal history of other malignant neoplasm of bronchus and lung: Secondary | ICD-10-CM | POA: Insufficient documentation

## 2020-07-10 DIAGNOSIS — M129 Arthropathy, unspecified: Secondary | ICD-10-CM | POA: Insufficient documentation

## 2020-07-10 DIAGNOSIS — C3490 Malignant neoplasm of unspecified part of unspecified bronchus or lung: Secondary | ICD-10-CM | POA: Diagnosis not present

## 2020-07-10 DIAGNOSIS — Z7982 Long term (current) use of aspirin: Secondary | ICD-10-CM | POA: Insufficient documentation

## 2020-07-10 DIAGNOSIS — Z79899 Other long term (current) drug therapy: Secondary | ICD-10-CM | POA: Diagnosis not present

## 2020-07-10 DIAGNOSIS — M8580 Other specified disorders of bone density and structure, unspecified site: Secondary | ICD-10-CM | POA: Insufficient documentation

## 2020-07-10 DIAGNOSIS — E785 Hyperlipidemia, unspecified: Secondary | ICD-10-CM | POA: Diagnosis not present

## 2020-07-10 DIAGNOSIS — I700 Atherosclerosis of aorta: Secondary | ICD-10-CM | POA: Diagnosis not present

## 2020-07-10 DIAGNOSIS — I2510 Atherosclerotic heart disease of native coronary artery without angina pectoris: Secondary | ICD-10-CM | POA: Diagnosis not present

## 2020-07-10 DIAGNOSIS — F0390 Unspecified dementia without behavioral disturbance: Secondary | ICD-10-CM | POA: Diagnosis not present

## 2020-07-10 DIAGNOSIS — I10 Essential (primary) hypertension: Secondary | ICD-10-CM | POA: Diagnosis not present

## 2020-07-10 DIAGNOSIS — K449 Diaphragmatic hernia without obstruction or gangrene: Secondary | ICD-10-CM | POA: Insufficient documentation

## 2020-07-10 DIAGNOSIS — R0602 Shortness of breath: Secondary | ICD-10-CM | POA: Insufficient documentation

## 2020-07-10 DIAGNOSIS — I509 Heart failure, unspecified: Secondary | ICD-10-CM | POA: Diagnosis not present

## 2020-07-10 LAB — CBC WITH DIFFERENTIAL (CANCER CENTER ONLY)
Abs Immature Granulocytes: 0.05 10*3/uL (ref 0.00–0.07)
Basophils Absolute: 0 10*3/uL (ref 0.0–0.1)
Basophils Relative: 1 %
Eosinophils Absolute: 0.2 10*3/uL (ref 0.0–0.5)
Eosinophils Relative: 3 %
HCT: 36.3 % (ref 36.0–46.0)
Hemoglobin: 11.7 g/dL — ABNORMAL LOW (ref 12.0–15.0)
Immature Granulocytes: 1 %
Lymphocytes Relative: 22 %
Lymphs Abs: 1.5 10*3/uL (ref 0.7–4.0)
MCH: 28.8 pg (ref 26.0–34.0)
MCHC: 32.2 g/dL (ref 30.0–36.0)
MCV: 89.4 fL (ref 80.0–100.0)
Monocytes Absolute: 0.7 10*3/uL (ref 0.1–1.0)
Monocytes Relative: 11 %
Neutro Abs: 4.3 10*3/uL (ref 1.7–7.7)
Neutrophils Relative %: 62 %
Platelet Count: 175 10*3/uL (ref 150–400)
RBC: 4.06 MIL/uL (ref 3.87–5.11)
RDW: 14 % (ref 11.5–15.5)
WBC Count: 6.8 10*3/uL (ref 4.0–10.5)
nRBC: 0 % (ref 0.0–0.2)

## 2020-07-10 LAB — CMP (CANCER CENTER ONLY)
ALT: 9 U/L (ref 0–44)
AST: 17 U/L (ref 15–41)
Albumin: 3.1 g/dL — ABNORMAL LOW (ref 3.5–5.0)
Alkaline Phosphatase: 87 U/L (ref 38–126)
Anion gap: 10 (ref 5–15)
BUN: 18 mg/dL (ref 8–23)
CO2: 25 mmol/L (ref 22–32)
Calcium: 9.4 mg/dL (ref 8.9–10.3)
Chloride: 102 mmol/L (ref 98–111)
Creatinine: 1.42 mg/dL — ABNORMAL HIGH (ref 0.44–1.00)
GFR, Est AFR Am: 37 mL/min — ABNORMAL LOW (ref >60)
GFR, Estimated: 32 mL/min — ABNORMAL LOW (ref >60)
Glucose, Bld: 221 mg/dL — ABNORMAL HIGH (ref 70–99)
Potassium: 3.5 mmol/L (ref 3.5–5.1)
Sodium: 137 mmol/L (ref 135–145)
Total Bilirubin: 0.5 mg/dL (ref 0.3–1.2)
Total Protein: 7.6 g/dL (ref 6.5–8.1)

## 2020-07-12 ENCOUNTER — Inpatient Hospital Stay (HOSPITAL_BASED_OUTPATIENT_CLINIC_OR_DEPARTMENT_OTHER): Payer: Medicare PPO | Admitting: Internal Medicine

## 2020-07-12 ENCOUNTER — Other Ambulatory Visit: Payer: Self-pay

## 2020-07-12 ENCOUNTER — Encounter: Payer: Self-pay | Admitting: Internal Medicine

## 2020-07-12 VITALS — BP 136/88 | HR 98 | Temp 97.7°F | Resp 18 | Ht 65.0 in | Wt 125.9 lb

## 2020-07-12 DIAGNOSIS — Z923 Personal history of irradiation: Secondary | ICD-10-CM | POA: Diagnosis not present

## 2020-07-12 DIAGNOSIS — F0390 Unspecified dementia without behavioral disturbance: Secondary | ICD-10-CM | POA: Diagnosis not present

## 2020-07-12 DIAGNOSIS — I2510 Atherosclerotic heart disease of native coronary artery without angina pectoris: Secondary | ICD-10-CM | POA: Diagnosis not present

## 2020-07-12 DIAGNOSIS — C3491 Malignant neoplasm of unspecified part of right bronchus or lung: Secondary | ICD-10-CM | POA: Diagnosis not present

## 2020-07-12 DIAGNOSIS — R0602 Shortness of breath: Secondary | ICD-10-CM | POA: Diagnosis not present

## 2020-07-12 DIAGNOSIS — I10 Essential (primary) hypertension: Secondary | ICD-10-CM | POA: Diagnosis not present

## 2020-07-12 DIAGNOSIS — C3490 Malignant neoplasm of unspecified part of unspecified bronchus or lung: Secondary | ICD-10-CM

## 2020-07-12 DIAGNOSIS — M8580 Other specified disorders of bone density and structure, unspecified site: Secondary | ICD-10-CM | POA: Diagnosis not present

## 2020-07-12 DIAGNOSIS — M129 Arthropathy, unspecified: Secondary | ICD-10-CM | POA: Diagnosis not present

## 2020-07-12 DIAGNOSIS — Z85118 Personal history of other malignant neoplasm of bronchus and lung: Secondary | ICD-10-CM | POA: Diagnosis not present

## 2020-07-12 DIAGNOSIS — I509 Heart failure, unspecified: Secondary | ICD-10-CM | POA: Diagnosis not present

## 2020-07-12 DIAGNOSIS — E785 Hyperlipidemia, unspecified: Secondary | ICD-10-CM | POA: Diagnosis not present

## 2020-07-12 NOTE — Progress Notes (Signed)
[place] [phone]   [phone]  OFFICE PROGRESS NOTE  [name], MD [address] [place] [address]  DIAGNOSIS: Stage IA (T1b, N0, M0) non-small cell lung cancer consistent with adenocarcinoma involving the right lower lobe, diagnosed in September 2014.  PRIOR THERAPY: Status post curative stereotactic radiotherapy to the right lower lobe lung nodule under the care of Dr. Tammi Klippel completed on 10/29/2013.  CURRENT THERAPY: Observation.  INTERVAL HISTORY: Joan Hunter 86 y.o. female returns to the clinic today for follow-up visit accompanied by her daughter.  The patient is feeling fine today with no concerning complaints except for mild fatigue and shortness of breath with exertion.  She denied having any chest pain, cough or hemoptysis.  She denied having any nausea, vomiting, diarrhea or constipation.  She lost few pounds since her last visit.  She is here today for evaluation with repeat CT scan of the chest for restaging of her disease.  MEDICAL HISTORY: Past Medical History:  Diagnosis Date  . Allergic rhinitis   . Arthritis   . CAD (coronary artery disease)    mod 2V CAD (60% LAD, 70-80% mid CX) by 01/2008 cath  . CHF (congestive heart failure) ([place])   . Dementia ([place])   . Diabetes mellitus    dx 10 plus yrs ago  . Dilated cardiomyopathy ([place])    now resolved, EF 60-65% echo 10/11/11 (Dr. Marlou Porch)  . Diverticulosis   . Hyperlipidemia   . Hypertension   . Hypokalemia 01/09/2017  . Lung cancer ([place]) dx'd 06/2012  . Lung mass   . MR (mitral regurgitation)   . Osteopenia     ALLERGIES:  is allergic to moxifloxacin, penicillins, and prednisone.  MEDICATIONS:  Current Outpatient Medications  Medication Sig Dispense Refill  . amLODipine (NORVASC) 5 MG tablet TAKE 1 TABLET BY MOUTH EVERYDAY AT BEDTIME 90 tablet 2  . aspirin 81 MG tablet Take 81 mg by mouth daily.    Marland Kitchen atorvastatin (LIPITOR) 40 MG  tablet Take 40 mg by mouth daily at 6 PM.     . divalproex (DEPAKOTE ER) 250 MG 24 hr tablet Take 1 tablet every night 90 tablet 3  . donepezil (ARICEPT) 10 MG tablet TAKE 1 TABLET BY MOUTH EVERY DAY 90 tablet 3  . furosemide (LASIX) 40 MG tablet TAKE 1 TABLET BY MOUTH EVERY OTHER DAY 45 tablet 2  . levalbuterol (XOPENEX HFA) 45 MCG/ACT inhaler INHALE 2 PUFFS EVERY 4-6 HRS AS NEEDED  3  . metoprolol succinate (TOPROL-XL) 100 MG 24 hr tablet TAKE 1 TABLET BY MOUTH DAILY. TAKE WITH OR IMMEDIATELY FOLLOWING A MEAL. 90 tablet 3  . mirtazapine (REMERON) 15 MG tablet Take 15 mg by mouth at bedtime.    . pantoprazole (PROTONIX) 40 MG tablet Take 40 mg by mouth daily.     . potassium chloride SA (K-DUR) 20 MEQ tablet Take 1 tablet (20 mEq total) by mouth daily. 7 tablet 0  . PROAIR HFA 108 (90 Base) MCG/ACT inhaler Inhale 2 puffs into the lungs every 6 (six) hours as needed for wheezing or shortness of breath.  6   No current facility-administered medications for this visit.    SURGICAL HISTORY:  Past Surgical History:  Procedure Laterality Date  . ABDOMINAL HYSTERECTOMY    . APPENDECTOMY    . EYE SURGERY     bil cataracts  . KNEE ARTHROSCOPY     ?  left  knee  .  TONSILLECTOMY      REVIEW OF SYSTEMS:  A comprehensive review of systems was negative except for: Constitutional: positive for weight loss   PHYSICAL EXAMINATION: General appearance: alert, cooperative, fatigued and no distress Head: Normocephalic, without obvious abnormality, atraumatic Neck: no adenopathy, no JVD, supple, symmetrical, trachea midline and thyroid not enlarged, symmetric, no tenderness/mass/nodules Lymph nodes: Cervical, supraclavicular, and axillary nodes normal. Resp: clear to auscultation bilaterally Back: symmetric, no curvature. ROM normal. No CVA tenderness. Cardio: regular rate and rhythm, S1, S2 normal, no murmur, click, rub or gallop GI: soft, non-tender; bowel sounds normal; no masses,  no  organomegaly Extremities: extremities normal, atraumatic, no cyanosis or edema  ECOG PERFORMANCE STATUS: 1 - Symptomatic but completely ambulatory  Blood pressure (!) 136/88, pulse 98, temperature 97.7 F (36.5 C), temperature source Temporal, resp. rate 18, height 5\' 5"  (1.651 m), weight 125 lb 14.4 oz (57.1 kg), SpO2 97 %.  LABORATORY DATA: Lab Results  Component Value Date   WBC 6.8 07/10/2020   HGB 11.7 (L) 07/10/2020   HCT 36.3 07/10/2020   MCV 89.4 07/10/2020   PLT 175 07/10/2020      Chemistry      Component Value Date/Time   NA 137 07/10/2020 0933   NA 144 12/26/2016 1022   K 3.5 07/10/2020 0933   K 2.9 (LL) 12/26/2016 1022   CL 102 07/10/2020 0933   CO2 25 07/10/2020 0933   CO2 29 12/26/2016 1022   BUN 18 07/10/2020 0933   BUN 16.7 12/26/2016 1022   CREATININE 1.42 (H) 07/10/2020 0933   CREATININE 1.0 12/26/2016 1022      Component Value Date/Time   CALCIUM 9.4 07/10/2020 0933   CALCIUM 9.3 12/26/2016 1022   ALKPHOS 87 07/10/2020 0933   ALKPHOS 105 12/26/2016 1022   AST 17 07/10/2020 0933   AST 16 12/26/2016 1022   ALT 9 07/10/2020 0933   ALT 8 12/26/2016 1022   BILITOT 0.5 07/10/2020 0933   BILITOT 0.84 12/26/2016 1022       RADIOGRAPHIC STUDIES: CT Chest Wo Contrast  Result Date: 07/10/2020 CLINICAL DATA:  Non-small cell lung cancer, restaging EXAM: CT CHEST WITHOUT CONTRAST TECHNIQUE: Multidetector CT imaging of the chest was performed following the standard protocol without IV contrast. COMPARISON:  07/08/2019 FINDINGS: Cardiovascular: The heart size appears within normal limits. No pericardial effusion. Aortic atherosclerosis. Extensive 3 vessel coronary artery calcifications as well as left main disease. Mediastinum/Nodes: Normal appearance of the thyroid gland. The trachea appears patent and is midline. Normal appearance of the esophagus. No axillary, supraclavicular, or mediastinal adenopathy. Hilar structures are suboptimally evaluated due to lack  of IV contrast material. Lungs/Pleura: No pleural effusion identified. The bandlike area of right perihilar fibrosis, mass like architectural distortion and volume loss is noted compatible with treatment related changes status post external beam radiation. The appearance appears similar to the previous exam. Subpleural nodular density within the posterior right lower lobe is also unchanged measuring 1.8 by 0.8 cm, image 86/7. Scattered small lung nodules are identified bilaterally and appears stable from previous exam including: -3 mm lateral right middle lobe lung nodule, image 65/7 -7 mm anteromedial right upper lobe lung nodule, image 44/7. -1 cm elongated, oval-shaped nodule in the superior segment of left lower lobe appears similar to previous exam, image 58/7. This is favored to represent an area of mucoid impaction within a dilated peripheral bronchial. Upper Abdomen: No acute abnormality.  Large hiatal hernia. Musculoskeletal: Treated compression deformities within the upper thoracic spine are  identified with associated kyphosis. The appearance is unchanged from previous exam. No suspicious bone lesions. IMPRESSION: 1. Stable CT of the chest. No specific findings identified to suggest residual or recurrent tumor or metastatic disease. 2. Similar appearance of right perihilar fibrosis, mass-like architectural distortion and volume loss compatible with treatment related changes status post external beam radiation. 3. Small nonspecific pulmonary nodules are unchanged from previous exam. 4. Extensive 3 vessel coronary artery calcifications as well as left main disease. 5. Aortic atherosclerosis. Aortic Atherosclerosis (ICD10-I70.0). Electronically Signed   By: Kerby Moors M.D.   On: 07/10/2020 12:29   ASSESSMENT AND PLAN:  This is a very pleasant 86 years old African-American female with stage IA non-small cell lung cancer. She status post curative SBRT to the right lower lobe nodule completed in November  2014. The patient is currently on observation and she is feeling fine. She had repeat CT scan of the chest performed recently.  I personally and independently reviewed the scans and discussed the results with the patient and her daughter. Her scan showed no concerning findings for disease recurrence or metastasis. I gave the patient the option of continuous follow-up and routine exam and scan in 1 year versus follow-up with her primary care physician from now on. She will be referred to continue the routine follow-up visit with me. I will see her back for follow-up visit in 1 year with the scan. She was advised to call immediately if she has any concerning symptoms in the interval. The patient voices understanding of current disease status and treatment options and is in agreement with the current care plan. All questions were answered. The patient knows to call the clinic with any problems, questions or concerns. We can certainly see the patient much sooner if necessary.  Disclaimer: This note was dictated with voice recognition software. Similar sounding words can inadvertently be transcribed and may not be corrected upon review.

## 2020-07-13 ENCOUNTER — Telehealth: Payer: Self-pay | Admitting: Internal Medicine

## 2020-07-13 NOTE — Telephone Encounter (Signed)
Scheduled per los. Called and left msg. Mailed printout  °

## 2020-07-14 DIAGNOSIS — I5022 Chronic systolic (congestive) heart failure: Secondary | ICD-10-CM | POA: Diagnosis not present

## 2020-07-14 DIAGNOSIS — E7800 Pure hypercholesterolemia, unspecified: Secondary | ICD-10-CM | POA: Diagnosis not present

## 2020-07-14 DIAGNOSIS — F0390 Unspecified dementia without behavioral disturbance: Secondary | ICD-10-CM | POA: Diagnosis not present

## 2020-07-14 DIAGNOSIS — I10 Essential (primary) hypertension: Secondary | ICD-10-CM | POA: Diagnosis not present

## 2020-07-14 DIAGNOSIS — N1832 Chronic kidney disease, stage 3b: Secondary | ICD-10-CM | POA: Diagnosis not present

## 2020-07-14 DIAGNOSIS — E1122 Type 2 diabetes mellitus with diabetic chronic kidney disease: Secondary | ICD-10-CM | POA: Diagnosis not present

## 2020-07-14 DIAGNOSIS — G3183 Dementia with Lewy bodies: Secondary | ICD-10-CM | POA: Diagnosis not present

## 2020-07-14 DIAGNOSIS — C3491 Malignant neoplasm of unspecified part of right bronchus or lung: Secondary | ICD-10-CM | POA: Diagnosis not present

## 2020-07-20 DIAGNOSIS — I739 Peripheral vascular disease, unspecified: Secondary | ICD-10-CM | POA: Diagnosis not present

## 2020-07-20 DIAGNOSIS — L603 Nail dystrophy: Secondary | ICD-10-CM | POA: Diagnosis not present

## 2020-07-20 DIAGNOSIS — L84 Corns and callosities: Secondary | ICD-10-CM | POA: Diagnosis not present

## 2020-07-20 DIAGNOSIS — E1151 Type 2 diabetes mellitus with diabetic peripheral angiopathy without gangrene: Secondary | ICD-10-CM | POA: Diagnosis not present

## 2020-10-20 ENCOUNTER — Ambulatory Visit: Payer: Medicare PPO | Admitting: Neurology

## 2020-10-20 ENCOUNTER — Other Ambulatory Visit: Payer: Self-pay

## 2020-10-20 ENCOUNTER — Encounter: Payer: Self-pay | Admitting: Neurology

## 2020-10-20 VITALS — BP 147/85 | HR 69 | Ht 65.0 in | Wt 125.6 lb

## 2020-10-20 DIAGNOSIS — F0391 Unspecified dementia with behavioral disturbance: Secondary | ICD-10-CM | POA: Diagnosis not present

## 2020-10-20 DIAGNOSIS — F03B18 Unspecified dementia, moderate, with other behavioral disturbance: Secondary | ICD-10-CM

## 2020-10-20 MED ORDER — DIVALPROEX SODIUM ER 250 MG PO TB24: ORAL_TABLET | ORAL | 3 refills | Status: DC

## 2020-10-20 MED ORDER — DONEPEZIL HCL 10 MG PO TABS: ORAL_TABLET | ORAL | 3 refills | Status: DC

## 2020-10-20 NOTE — Progress Notes (Signed)
NEUROLOGY FOLLOW UP OFFICE NOTE  Joan Hunter [id number] May 06, 1981  HISTORY OF PRESENT ILLNESS: I had the pleasure of seeing Joan Hunter in the neurology clinic on 10/21/2083.  The patient was last seen 7 months ago for moderate dementia, likely Lewy body dementia with significant hallucinations on initial presentation. She is again accompanied by her daughter who helps supplement the history today. She is sitting on a wheelchair, her daughter reports she is sleeping all the time. She is able to put her clothes on, her daughter helps her into her shower chair with some help needed to wash herself. She can ambulate with a cane at home and go to the bathroom alone. She can change her Depends by herself. Her daughter manages finances, medications, meals. She is on Donepezil 10mg  daily, Mirtazapine 15mg  qhs, and Depakote ER 250mg  qhs. Hallucinations are better on medication, she still has them sometimes. She gets "real ugly" to her daughter, no paranoia.    History on Initial Assessment 12/06/2016: Joan is an 86 yo RH Hunter with a history of diabetes, hypertension, CAD, lung cancer, with worsening memory and hallucinations. When asked about her memory, she states there are some things she remembers and some she does not. She lives with her daughter and states that she cooks (she does not). She denies misplacing things but daughter shakes her head. Her daughter administers medications, otherwise she would not take them. Her daughter states that memory changes started only around 2 months ago, prior to Joan it was "pretty good." She started having difficulties remembering the days of the week, asking her grandson why he was not in school, then a few minutes later asking him the same question. She used to fix her own medications, but over the past month, her daughter has to physically watch her take them. She used to live by herself but after 2 falls, her daughter has lived with her for  the past 2 years. Daughter has always been in charge of bill payments. She stopped driving 5 years ago due to bad knees. She denied getting lost driving, her daughter states she can still give directions when someone else is driving.  Her daughter's main concern are the hallucinations. The patient thinks these are real and denies having any hallucinations. Her daughter cites multiple instances, these started in June but have been worsening. She would talk about people across the street or someone sitting outside, a dog across the street in the car. She told her daughter she is in trouble with the people she works with, that they were downstairs. She has seen a cat on the bed, or something under the bed, something eating her shoes. She would ask her daughter if the man downstairs was gone. She said there was a green car in the driveway, which was actually a large tarp. She saw a little boy sitting up on the tree outside. She called her daughter "Momma" Joan morning. She denies any auditory hallucinations. Her daughter denies any personality changes, no paranoia associated with these hallucinations. The patient denies any headaches, dizziness, diplopia, dysarthria, dysphagia, neck/back pain, focal numbness/tingling/weakness, bowel/bladder dysfunction. No anosmia, tremors, no falls. They deny any significant head injuries. No family history of dementia.   PAST MEDICAL HISTORY: Past Medical History:  Diagnosis Date  . Allergic rhinitis   . Arthritis   . CAD (coronary artery disease)    mod 2V CAD (60% LAD, 70-80% mid CX) by 01/2008 cath  . CHF (congestive heart failure) ([place])   .  Dementia ([place])   . Diabetes mellitus    dx 10 plus yrs ago  . Dilated cardiomyopathy ([place])    now resolved, EF 60-65% echo 10/11/11 (Dr. Marlou Porch)  . Diverticulosis   . Hyperlipidemia   . Hypertension   . Hypokalemia 01/09/2017  . Lung cancer ([place]) dx'd 06/2012  . Lung mass   . MR (mitral regurgitation)   . Osteopenia      MEDICATIONS: Current Outpatient Medications on File Prior to Visit  Medication Sig Dispense Refill  . amLODipine (NORVASC) 5 MG tablet TAKE 1 TABLET BY MOUTH EVERYDAY AT BEDTIME 90 tablet 2  . aspirin 81 MG tablet Take 81 mg by mouth daily.    Marland Kitchen atorvastatin (LIPITOR) 10 MG tablet     . divalproex (DEPAKOTE ER) 250 MG 24 hr tablet Take 1 tablet every night 90 tablet 3  . donepezil (ARICEPT) 10 MG tablet TAKE 1 TABLET BY MOUTH EVERY DAY 90 tablet 3  . furosemide (LASIX) 40 MG tablet TAKE 1 TABLET BY MOUTH EVERY OTHER DAY 45 tablet 2  . levalbuterol (XOPENEX HFA) 45 MCG/ACT inhaler INHALE 2 PUFFS EVERY 4-6 HRS AS NEEDED  3  . metoprolol succinate (TOPROL-XL) 100 MG 24 hr tablet TAKE 1 TABLET BY MOUTH DAILY. TAKE WITH OR IMMEDIATELY FOLLOWING A MEAL. 90 tablet 3  . mirtazapine (REMERON) 15 MG tablet Take 15 mg by mouth at bedtime.    . pantoprazole (PROTONIX) 40 MG tablet Take 40 mg by mouth daily.     . potassium chloride SA (K-DUR) 20 MEQ tablet Take 1 tablet (20 mEq total) by mouth daily. (Patient not taking: Reported on 10/20/2020) 7 tablet 0  . PROAIR HFA 108 (90 Base) MCG/ACT inhaler Inhale 2 puffs into the lungs every 6 (six) hours as needed for wheezing or shortness of breath. (Patient not taking: Reported on 10/20/2020)  6   No current facility-administered medications on file prior to visit.    ALLERGIES: Allergies  Allergen Reactions  . Moxifloxacin     REACTION: hives  . Penicillins     REACTION: hives  . Prednisone     REACTION: hives    FAMILY HISTORY: Family History  Problem Relation Age of Onset  . Colon cancer Mother   . Heart disease Brother     SOCIAL HISTORY: Social History   Socioeconomic History  . Marital status: Widowed    Spouse name: Not on file  . Number of children: Not on file  . Years of education: Not on file  . Highest education level: Not on file  Occupational History  . Not on file  Tobacco Use  . Smoking status: Former Smoker     Packs/day: 0.50    Years: 5.00    Pack years: 2.50    Types: Cigarettes    Quit date: 06/15/1972    Years since quitting: 48.3  . Smokeless tobacco: Never Used  Vaping Use  . Vaping Use: Never used  Substance and Sexual Activity  . Alcohol use: No  . Drug use: No  . Sexual activity: Not Currently  Other Topics Concern  . Not on file  Social History Narrative   Daughter resides with patient and is her primary care giver.      12th grade      Right handed      Social Determinants of Health   Financial Resource Strain:   . Difficulty of Paying Living Expenses: Not on file  Food Insecurity:   . Worried About Running  Out of Food in the Last Year: Not on file  . Ran Out of Food in the Last Year: Not on file  Transportation Needs:   . Lack of Transportation (Medical): Not on file  . Lack of Transportation (Non-Medical): Not on file  Physical Activity:   . Days of Exercise per Week: Not on file  . Minutes of Exercise per Session: Not on file  Stress:   . Feeling of Stress : Not on file  Social Connections:   . Frequency of Communication with Friends and Family: Not on file  . Frequency of Social Gatherings with Friends and Family: Not on file  . Attends Religious Services: Not on file  . Active Member of Clubs or Organizations: Not on file  . Attends Archivist Meetings: Not on file  . Marital Status: Not on file  Intimate Partner Violence:   . Fear of Current or Ex-Partner: Not on file  . Emotionally Abused: Not on file  . Physically Abused: Not on file  . Sexually Abused: Not on file     PHYSICAL EXAM: Vitals:   10/20/20 1602  BP: (!) 147/85  Pulse: 69  SpO2: 96%   General: No acute distress Head:  Normocephalic/atraumatic Skin/Extremities: No rash, no edema Neurological Exam: alert and oriented to person. No aphasia or dysarthria. Fund of knowledge is reduced.  Recent and remote memory are impaired, 0/3 delayed recall.  Attention and concentration are  reduced.  Cranial nerves: Pupils equal, round. Extraocular movements intact with no nystagmus. Visual fields full.  No facial asymmetry.  Motor: Bulk and tone normal, muscle strength 5/5 throughout with no pronator drift.   Finger to nose testing intact.  Gait not tested.    IMPRESSION: Joan is an 86 yo RH Hunter with a history of diabetes, hypertension, CAD, lung cancer, and moderate dementia with behavioral disturbance. Initial presentation with hallucinations, suggestive of Lewy body dementia. MRI brain did not show any acute changes, there was moderate to severe chronic microvascular disease. MMSE 10/30 in 08/2019. Continue current medications. Her daughter continues to report agitation, mostly directed toward daughter. We may consider switching to Lexapro on next visit, her daughter would like to continue on current medications for now. Continue 24/7 care. Hunter in 6-8 months, they know to call for any changes.    Thank you for allowing me to participate in her care.  Please do not hesitate to call for any questions or concerns.   Ellouise Newer, M.D.   CC: Dr. [name]

## 2020-10-20 NOTE — Patient Instructions (Signed)
Continue all your medications. Continue 24/7 care. Follow-up in 6-8 months, call for any changes.   FALL PRECAUTIONS: Be cautious when walking. Scan the area for obstacles that may increase the risk of trips and falls. When getting up in the mornings, sit up at the edge of the bed for a few minutes before getting out of bed. Consider elevating the bed at the head end to avoid drop of blood pressure when getting up. Walk always in a well-lit room (use night lights in the walls). Avoid area rugs or power cords from appliances in the middle of the walkways. Use a walker or a cane if necessary and consider physical therapy for balance exercise. Get your eyesight checked regularly.   HOME SAFETY: Consider the safety of the kitchen when operating appliances like stoves, microwave oven, and blender. Consider having supervision and share cooking responsibilities until no longer able to participate in those. Accidents with firearms and other hazards in the house should be identified and addressed as well.   ABILITY TO BE LEFT ALONE: If patient is unable to contact 911 operator, consider using LifeLine, or when the need is there, arrange for someone to stay with patients. Smoking is a fire hazard, consider supervision or cessation. Risk of wandering should be assessed by caregiver and if detected at any point, supervision and safe proof recommendations should be instituted.  RECOMMENDATIONS FOR ALL PATIENTS WITH MEMORY PROBLEMS: 1. Continue to exercise (Recommend 30 minutes of walking everyday, or 3 hours every week) 2. Increase social interactions - continue going to [place] and enjoy social gatherings with friends and family 3. Eat healthy, avoid fried foods and eat more fruits and vegetables 4. Maintain adequate blood pressure, blood sugar, and blood cholesterol level. Reducing the risk of stroke and cardiovascular disease also helps promoting better memory. 5. Avoid stressful situations. Live a simple life  and avoid aggravations. Organize your time and prepare for the next day in anticipation. 6. Sleep well, avoid any interruptions of sleep and avoid any distractions in the bedroom that may interfere with adequate sleep quality 7. Avoid sugar, avoid sweets as there is a strong link between excessive sugar intake, diabetes, and cognitive impairment The Mediterranean diet has been shown to help patients reduce the risk of progressive memory disorders and reduces cardiovascular risk. This includes eating fish, eat fruits and green leafy vegetables, nuts like almonds and hazelnuts, walnuts, and also use olive oil. Avoid fast foods and fried foods as much as possible. Avoid sweets and sugar as sugar use has been linked to worsening of memory function.  There is always a concern of gradual progression of memory problems. If this is the case, then we may need to adjust level of care according to patient needs. Support, both to the patient and caregiver, should then be put into place.

## 2020-11-08 ENCOUNTER — Encounter: Payer: Self-pay | Admitting: Cardiology

## 2020-11-08 ENCOUNTER — Ambulatory Visit: Payer: Medicare PPO | Admitting: Cardiology

## 2020-11-08 ENCOUNTER — Other Ambulatory Visit: Payer: Self-pay

## 2020-11-08 VITALS — BP 120/80 | HR 99 | Ht 65.0 in | Wt 124.0 lb

## 2020-11-08 DIAGNOSIS — I700 Atherosclerosis of aorta: Secondary | ICD-10-CM | POA: Diagnosis not present

## 2020-11-08 DIAGNOSIS — I2583 Coronary atherosclerosis due to lipid rich plaque: Secondary | ICD-10-CM

## 2020-11-08 DIAGNOSIS — I10 Essential (primary) hypertension: Secondary | ICD-10-CM

## 2020-11-08 DIAGNOSIS — I2510 Atherosclerotic heart disease of native coronary artery without angina pectoris: Secondary | ICD-10-CM | POA: Diagnosis not present

## 2020-11-08 MED ORDER — ATORVASTATIN CALCIUM 10 MG PO TABS: 10.0000 mg | ORAL_TABLET | ORAL | 3 refills | Status: DC

## 2020-11-08 NOTE — Progress Notes (Signed)
Cardiology Office Note:    Date:  11/08/2020   ID:  Joan Hunter, DOB [birth year], MRN [id number]  PCP:  Joan Carol, MD  [facility] Cardiologist:  Joan Furbish, MD  [facility] Electrophysiologist:  None   Referring MD: Joan Carol, MD     History of Present Illness:    Joan Hunter is a 86 y.o. female here for follow-up of moderate nonflow limiting coronary artery disease, prior cardiomyopathy EF 15% now resolved with mitral regurgitation here for follow-up.  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 Dr. Tammi Klippel. Radiation only.   Dr. Melvyn Novas changed Coreg to Bystolic to bisoprolol, but she is now back on Toprol. COPD/bronchitis. Treating hiatal hernia with Protonix as well.   Continuing with atorvastatin, hyperlipidemia.  She experienced some dizziness when standing and her medications were previously reduced. This has improved.  Her daughter,[name], helps her out significantly but she also make sure that she gets up and moves around adequately.   I encouraged her to use her walker. She has a split-level house. Concerned about stair use.   NYHA class I-II symptoms.  Has been seen by neurology.  Note reviewed.  Moderate memory impairment.  Does not seem to have any orthopnea or PND.  Past Medical History:  Diagnosis Date   Allergic rhinitis    Arthritis    CAD (coronary artery disease)    mod 2V CAD (60% LAD, 70-80% mid CX) by 01/2008 cath   CHF (congestive heart failure) (HCC)    Dementia (HCC)    Diabetes mellitus    dx 10 plus yrs ago   Dilated cardiomyopathy ([place])    now resolved, EF 60-65% echo 10/11/11 (Dr. Marlou Porch)   Diverticulosis    Hyperlipidemia    Hypertension    Hypokalemia 01/09/2017   Lung cancer ([place]) dx'd 06/2012   Lung mass    MR (mitral regurgitation)    Osteopenia     Past Surgical History:  Procedure Laterality Date   ABDOMINAL  HYSTERECTOMY     APPENDECTOMY     EYE SURGERY     bil cataracts   KNEE ARTHROSCOPY     ?  left  knee   TONSILLECTOMY      Current Medications: Current Meds  Medication Sig   amLODipine (NORVASC) 5 MG tablet TAKE 1 TABLET BY MOUTH EVERYDAY AT BEDTIME   aspirin 81 MG tablet Take 81 mg by mouth daily.   atorvastatin (LIPITOR) 10 MG tablet Take 1 tablet (10 mg total) by mouth every other day.   divalproex (DEPAKOTE ER) 250 MG 24 hr tablet Take 1 tablet every night   donepezil (ARICEPT) 10 MG tablet TAKE 1 TABLET BY MOUTH EVERY DAY   furosemide (LASIX) 40 MG tablet TAKE 1 TABLET BY MOUTH EVERY OTHER DAY   levalbuterol (XOPENEX HFA) 45 MCG/ACT inhaler INHALE 2 PUFFS EVERY 4-6 HRS AS NEEDED   metoprolol succinate (TOPROL-XL) 100 MG 24 hr tablet TAKE 1 TABLET BY MOUTH DAILY. TAKE WITH OR IMMEDIATELY FOLLOWING A MEAL.   mirtazapine (REMERON) 15 MG tablet Take 15 mg by mouth at bedtime.   pantoprazole (PROTONIX) 40 MG tablet Take 40 mg by mouth daily.    potassium chloride SA (K-DUR) 20 MEQ tablet Take 1 tablet (20 mEq total) by mouth daily.   PROAIR HFA 108 (90 Base) MCG/ACT inhaler Inhale 2 puffs into the lungs every 6 (six) hours as needed for wheezing or shortness of breath.    [  DISCONTINUED] atorvastatin (LIPITOR) 10 MG tablet Take 10 mg by mouth every other day.      Allergies:   Moxifloxacin, Penicillins, and Prednisone   Social History   Socioeconomic History   Marital status: Widowed    Spouse name: Not on file   Number of children: Not on file   Years of education: Not on file   Highest education level: Not on file  Occupational History   Not on file  Tobacco Use   Smoking status: Former Smoker    Packs/day: 0.50    Years: 5.00    Pack years: 2.50    Types: Cigarettes    Quit date: 06/15/1972    Years since quitting: 48.4   Smokeless tobacco: Never Used  Vaping Use   Vaping Use: Never used  Substance and Sexual Activity   Alcohol use: No    Drug use: No   Sexual activity: Not Currently  Other Topics Concern   Not on file  Social History Narrative   Daughter resides with patient and is her primary care giver.      12th grade      Right handed      Social Determinants of Health   Financial Resource Strain:    Difficulty of Paying Living Expenses: Not on file  Food Insecurity:    Worried About [place] in the Last Year: Not on file   Ran Out of Food in the Last Year: Not on file  Transportation Needs:    Lack of Transportation (Medical): Not on file   Lack of Transportation (Non-Medical): Not on file  Physical Activity:    Days of Exercise per Week: Not on file   Minutes of Exercise per Session: Not on file  Stress:    Feeling of Stress : Not on file  Social Connections:    Frequency of Communication with Friends and Family: Not on file   Frequency of Social Gatherings with Friends and Family: Not on file   Attends Religious Services: Not on file   Active Member of Clubs or Organizations: Not on file   Attends Archivist Meetings: Not on file   Marital Status: Not on file     Family History: The patient's family history includes Colon cancer in her mother; Heart disease in her brother.  ROS:   Please see the history of present illness.     All other systems reviewed and are negative.  EKGs/Labs/Other Studies Reviewed:    The following studies were reviewed today: Prior echo  EKG:  EKG is  ordered today.  The ekg ordered today demonstrates sinus rhythm 99 left anterior fascicular block  Recent Labs: 07/10/2020: ALT 9; BUN 18; Creatinine 1.42; Hemoglobin 11.7; Platelet Count 175; Potassium 3.5; Sodium 137  Recent Lipid Panel    Component Value Date/Time   CHOL 143 06/15/2012 0943   TRIG 83 06/15/2012 0943   HDL 62 06/15/2012 0943   CHOLHDL 2.3 06/15/2012 0943   VLDL 17 06/15/2012 0943   LDLCALC 64 06/15/2012 0943      Physical Exam:    VS:  BP 120/80     Pulse 99    Ht 5\' 5"  (1.651 m)    Wt 124 lb (56.2 kg)    SpO2 97%    BMI 20.63 kg/m     Wt Readings from Last 3 Encounters:  11/08/20 124 lb (56.2 kg)  10/20/20 125 lb 9.6 oz (57 kg)  07/12/20 125 lb 14.4 oz (57.1  kg)     GEN:  Well nourished, well developed in no acute distress HEENT: Normal NECK: No JVD; No carotid bruits LYMPHATICS: No lymphadenopathy CARDIAC: RRR, no murmurs, rubs, gallops RESPIRATORY:  Clear to auscultation without rales, wheezing or rhonchi  ABDOMEN: Soft, non-tender, non-distended MUSCULOSKELETAL:  No edema; No deformity  SKIN: Warm and dry NEUROLOGIC:  Alert and oriented x 3 PSYCHIATRIC:  Normal affect   ASSESSMENT:    1. Coronary artery disease due to lipid rich plaque   2. Essential hypertension   3. Aortic atherosclerosis (HCC)    PLAN:    In order of problems listed above:  Dilated cardiomyopathy -EF at previously 15% now normal.  Doing well on Toprol 100.  No changes made.  Non-small cell cancer of right lung, stage Ia -Radiation only.  Excellent results.  Remission.  Dr. Earlie Server.  Notes reviewed.  Pulmonary fibrosis -Dr. Melvyn Novas.  No recent exacerbations.  Thankful.  Mitral regurgitation -After cardiomyopathy result, her functional mitral regurgitation was also improved to mild.  Left anterior fascicular block -May warrant pacemaker in the future if further conduction system disorder takes place.     Shared Decision Making/Informed Consent        Medication Adjustments/Labs and Tests Ordered: Current medicines are reviewed at length with the patient today.  Concerns regarding medicines are outlined above.  Orders Placed This Encounter  Procedures   EKG 12-Lead   Meds ordered this encounter  Medications   atorvastatin (LIPITOR) 10 MG tablet    Sig: Take 1 tablet (10 mg total) by mouth every other day.    Dispense:  45 tablet    Refill:  3    Patient Instructions  Medication Instructions:  The current medical regimen is  effective;  continue present plan and medications.  *If you need a refill on your cardiac medications before your next appointment, please call your pharmacy*  Follow-Up: At [facility], you and your health needs are our priority.  As part of our continuing mission to provide you with exceptional heart care, we have created designated Provider Care Teams.  These Care Teams include your primary Cardiologist (physician) and Advanced Practice Providers (APPs -  Physician Assistants and Nurse Practitioners) who all work together to provide you with the care you need, when you need it.  We recommend signing up for the patient portal called "MyChart".  Sign up information is provided on this After Visit Summary.  MyChart is used to connect with patients for Virtual Visits (Telemedicine).  Patients are able to view lab/test results, encounter notes, upcoming appointments, etc.  Non-urgent messages can be sent to your provider as well.   To learn more about what you can do with MyChart, go to [email or web address].    Your next appointment:   4 month(s)  The format for your next appointment:   In Person  Provider:   Candee Furbish, MD   Thank you for choosing [facility]!!        Signed, Joan Furbish, MD  11/08/2020 3:45 PM    [place]

## 2020-11-08 NOTE — Patient Instructions (Signed)
Medication Instructions:  The current medical regimen is effective;  continue present plan and medications.  *If you need a refill on your cardiac medications before your next appointment, please call your pharmacy*  Follow-Up: At [facility], you and your health needs are our priority.  As part of our continuing mission to provide you with exceptional heart care, we have created designated Provider Care Teams.  These Care Teams include your primary Cardiologist (physician) and Advanced Practice Providers (APPs -  Physician Assistants and Nurse Practitioners) who all work together to provide you with the care you need, when you need it.  We recommend signing up for the patient portal called "MyChart".  Sign up information is provided on this After Visit Summary.  MyChart is used to connect with patients for Virtual Visits (Telemedicine).  Patients are able to view lab/test results, encounter notes, upcoming appointments, etc.  Non-urgent messages can be sent to your provider as well.   To learn more about what you can do with MyChart, go to [email or web address].    Your next appointment:   4 month(s)  The format for your next appointment:   In Person  Provider:   Candee Furbish, MD   Thank you for choosing [facility]!!

## 2020-11-14 ENCOUNTER — Other Ambulatory Visit: Payer: Self-pay | Admitting: *Deleted

## 2020-11-14 MED ORDER — POTASSIUM CHLORIDE 20 MEQ/15ML (10%) PO SOLN: 20.0000 meq | Freq: Every day | ORAL | 11 refills | Status: AC

## 2020-11-14 NOTE — Progress Notes (Signed)
Hello Dr. Marlou Porch.  This is [name], Michalla's daughter.  We talked about an alternative for that large pill.  Your assistant told me to check with my mothers insurance to see what they would pay for.  This is what she told me. Potassium chloride and Potassium chloride oral liquid. If she has any questions she can call me at [phone].  Thank you, Zenia Resides. [place]   Rx sent into pharmacy for the liquid form.

## 2020-11-17 DIAGNOSIS — L603 Nail dystrophy: Secondary | ICD-10-CM | POA: Diagnosis not present

## 2020-11-17 DIAGNOSIS — L84 Corns and callosities: Secondary | ICD-10-CM | POA: Diagnosis not present

## 2020-11-17 DIAGNOSIS — E1151 Type 2 diabetes mellitus with diabetic peripheral angiopathy without gangrene: Secondary | ICD-10-CM | POA: Diagnosis not present

## 2020-11-17 DIAGNOSIS — I739 Peripheral vascular disease, unspecified: Secondary | ICD-10-CM | POA: Diagnosis not present

## 2020-12-12 ENCOUNTER — Other Ambulatory Visit: Payer: Self-pay | Admitting: Cardiology

## 2021-01-16 DIAGNOSIS — F0390 Unspecified dementia without behavioral disturbance: Secondary | ICD-10-CM | POA: Diagnosis not present

## 2021-01-16 DIAGNOSIS — N1831 Chronic kidney disease, stage 3a: Secondary | ICD-10-CM | POA: Diagnosis not present

## 2021-01-16 DIAGNOSIS — E1122 Type 2 diabetes mellitus with diabetic chronic kidney disease: Secondary | ICD-10-CM | POA: Diagnosis not present

## 2021-01-16 DIAGNOSIS — I2510 Atherosclerotic heart disease of native coronary artery without angina pectoris: Secondary | ICD-10-CM | POA: Diagnosis not present

## 2021-01-16 DIAGNOSIS — I5022 Chronic systolic (congestive) heart failure: Secondary | ICD-10-CM | POA: Diagnosis not present

## 2021-01-16 DIAGNOSIS — I10 Essential (primary) hypertension: Secondary | ICD-10-CM | POA: Diagnosis not present

## 2021-01-30 ENCOUNTER — Other Ambulatory Visit: Payer: Self-pay | Admitting: Cardiology

## 2021-04-29 ENCOUNTER — Other Ambulatory Visit: Payer: Self-pay | Admitting: Cardiology

## 2021-07-02 ENCOUNTER — Other Ambulatory Visit: Payer: Self-pay

## 2021-07-02 ENCOUNTER — Ambulatory Visit: Payer: Medicare PPO | Admitting: Neurology

## 2021-07-02 ENCOUNTER — Encounter: Payer: Self-pay | Admitting: Neurology

## 2021-07-02 VITALS — BP 103/71 | HR 145 | Ht 64.0 in | Wt 104.0 lb

## 2021-07-02 DIAGNOSIS — F03B18 Unspecified dementia, moderate, with other behavioral disturbance: Secondary | ICD-10-CM

## 2021-07-02 DIAGNOSIS — F0391 Unspecified dementia with behavioral disturbance: Secondary | ICD-10-CM

## 2021-07-02 MED ORDER — MIRTAZAPINE 15 MG PO TABS: 15.0000 mg | ORAL_TABLET | Freq: Every day | ORAL | 3 refills | Status: DC

## 2021-07-02 MED ORDER — DIVALPROEX SODIUM ER 250 MG PO TB24: ORAL_TABLET | ORAL | 3 refills | Status: DC

## 2021-07-02 MED ORDER — DONEPEZIL HCL 10 MG PO TABS: ORAL_TABLET | ORAL | 3 refills | Status: DC

## 2021-07-02 NOTE — Progress Notes (Signed)
NEUROLOGY FOLLOW UP OFFICE NOTE  Joan Hunter [id number] [birth year]  HISTORY OF PRESENT ILLNESS: I had the pleasure of seeing Joan Hunter in follow-up in the neurology clinic on 07/02/2021.  The patient was last seen 8 months ago for moderate dementia, likely Lewy body dementia with significant hallucinations on initial presentation. She is again accompanied by her daughter [name] who helps supplement the history today. Her daughter manages medications, finances, meals. She is on Donepezil 10mg  daily, Mirtazapine 15mg  qhs, and Depakote ER 250mg  qhs. Symptoms overall stable. [name] states she is not hallucinating. There is not a lot of agitation. She sleeps well at night but also sleeps during the day. There are times she is awake during the day as well, [name] would like to stay on current medications at this time. She is able to change her own Depends and clothes, [name] gives instructions when she bathes.     History on Initial Assessment 12/06/2016: This is an 87 yo RH woman with a history of diabetes, hypertension, CAD, lung cancer, with worsening memory and hallucinations. When asked about her memory, she states there are some things she remembers and some she does not. She lives with her daughter and states that she cooks (she does not). She denies misplacing things but daughter shakes her head. Her daughter administers medications, otherwise she would not take them. Her daughter states that memory changes started only around 2 months ago, prior to this it was "pretty good." She started having difficulties remembering the days of the week, asking her grandson why he was not in school, then a few minutes later asking him the same question. She used to fix her own medications, but over the past month, her daughter has to physically watch her take them. She used to live by herself but after 2 falls, her daughter has lived with her for the past 2 years. Daughter has always been in charge of  bill payments. She stopped driving 5 years ago due to bad knees. She denied getting lost driving, her daughter states she can still give directions when someone else is driving.   Her daughter's main concern are the hallucinations. The patient thinks these are real and denies having any hallucinations. Her daughter cites multiple instances, these started in June but have been worsening. She would talk about people across the street or someone sitting outside, a dog across the street in the car. She told her daughter she is in trouble with the people she works with, that they were downstairs. She has seen a cat on the bed, or something under the bed, something eating her shoes. She would ask her daughter if the man downstairs was gone. She said there was a green car in the driveway, which was actually a large tarp. She saw a little boy sitting up on the tree outside. She called her daughter "Momma" this morning. She denies any auditory hallucinations. Her daughter denies any personality changes, no paranoia associated with these hallucinations. The patient denies any headaches, dizziness, diplopia, dysarthria, dysphagia, neck/back pain, focal numbness/tingling/weakness, bowel/bladder dysfunction. No anosmia, tremors, no falls. They deny any significant head injuries. No family history of dementia.   PAST MEDICAL HISTORY: Past Medical History:  Diagnosis Date   Allergic rhinitis    Arthritis    CAD (coronary artery disease)    mod 2V CAD (60% LAD, 70-80% mid CX) by 01/2008 cath   CHF (congestive heart failure) (HCC)    Dementia (HCC)    Diabetes mellitus  dx 10 plus yrs ago   Dilated cardiomyopathy ([place])    now resolved, EF 60-65% echo 10/11/11 (Dr. Marlou Porch)   Diverticulosis    Hyperlipidemia    Hypertension    Hypokalemia 01/09/2017   Lung cancer ([place]) dx'd 06/2012   Lung mass    MR (mitral regurgitation)    Osteopenia     MEDICATIONS: Current Outpatient Medications on File Prior to Visit   Medication Sig Dispense Refill   amLODipine (NORVASC) 5 MG tablet TAKE 1 TABLET BY MOUTH EVERYDAY AT BEDTIME 90 tablet 2   aspirin 81 MG tablet Take 81 mg by mouth daily.     atorvastatin (LIPITOR) 10 MG tablet Take 1 tablet (10 mg total) by mouth every other day. 45 tablet 3   divalproex (DEPAKOTE ER) 250 MG 24 hr tablet Take 1 tablet every night 90 tablet 3   donepezil (ARICEPT) 10 MG tablet TAKE 1 TABLET BY MOUTH EVERY DAY 90 tablet 3   furosemide (LASIX) 40 MG tablet TAKE 1 TABLET BY MOUTH EVERY OTHER DAY 45 tablet 2   levalbuterol (XOPENEX HFA) 45 MCG/ACT inhaler INHALE 2 PUFFS EVERY 4-6 HRS AS NEEDED  3   metoprolol succinate (TOPROL-XL) 100 MG 24 hr tablet TAKE 1 TABLET BY MOUTH DAILY. TAKE WITH OR IMMEDIATELY FOLLOWING A MEAL. 90 tablet 2   mirtazapine (REMERON) 15 MG tablet Take 15 mg by mouth at bedtime.     pantoprazole (PROTONIX) 40 MG tablet Take 40 mg by mouth daily.      potassium chloride 20 MEQ/15ML (10%) SOLN Take 15 mLs (20 mEq total) by mouth daily. 473 mL 11   PROAIR HFA 108 (90 Base) MCG/ACT inhaler Inhale 2 puffs into the lungs every 6 (six) hours as needed for wheezing or shortness of breath.   6   No current facility-administered medications on file prior to visit.    ALLERGIES: Allergies  Allergen Reactions   Moxifloxacin     REACTION: hives   Penicillins     REACTION: hives   Prednisone     REACTION: hives    FAMILY HISTORY: Family History  Problem Relation Age of Onset   Colon cancer Mother    Heart disease Brother     SOCIAL HISTORY: Social History   Socioeconomic History   Marital status: Widowed    Spouse name: Not on file   Number of children: Not on file   Years of education: Not on file   Highest education level: Not on file  Occupational History   Not on file  Tobacco Use   Smoking status: Former    Packs/day: 0.50    Years: 5.00    Pack years: 2.50    Types: Cigarettes    Quit date: 06/15/1972    Years since quitting: 49.0    Smokeless tobacco: Never  Vaping Use   Vaping Use: Never used  Substance and Sexual Activity   Alcohol use: No   Drug use: No   Sexual activity: Not Currently  Other Topics Concern   Not on file  Social History Narrative   Daughter resides with patient and is her primary care giver.      12th grade      Right handed      Social Determinants of Health   Financial Resource Strain: Not on file  Food Insecurity: Not on file  Transportation Needs: Not on file  Physical Activity: Not on file  Stress: Not on file  Social Connections: Not on file  Intimate Partner Violence: Not on file     PHYSICAL EXAM: Vitals:   07/02/21 1613  BP: 103/71  Pulse: (!) 145  SpO2: 96%   General: No acute distress, sitting on wheelchair Head:  Normocephalic/atraumatic Skin/Extremities: No rash, no edema Neurological Exam: alert and oriented to person. No aphasia or dysarthria. Fund of knowledge is reduced.  Recent and remote memory are impaired.  Attention and concentration are reduced. MMSE 5/30 MMSE - Mini Mental State Exam 07/02/2021 08/17/2019 01/13/2019  Not completed: - - -  Orientation to time 0 0 1  Orientation to Place 0 2 4  Registration 3 3 3   Attention/ Calculation 0 0 0  Recall 0 0 0  Language- name 2 objects 1 2 2   Language- repeat 0 0 0  Language- follow 3 step command 1 3 3   Language- read & follow direction 0 0 1  Write a sentence 0 0 1  Copy design 0 0 0  Total score 5 10 15    Cranial nerves: Pupils equal, round. Extraocular movements intact with no nystagmus. Visual fields full.  No facial asymmetry.  Motor: Bulk and tone normal, muscle strength 5/5 throughout with no pronator drift.   Finger to nose testing intact.  Gait not tested. No resting tremor. Bilateral postural and endpoint tremors.    IMPRESSION: This is an 87 yo RH woman with a history of diabetes, hypertension, CAD, lung cancer, and moderate dementia with behavioral disturbance. Initial presentation with  hallucinations, suggestive of Lewy body dementia. MRI brain did not show any acute changes, there was moderate to severe chronic microvascular disease. MMSE 5/30 (10/30 in 08/2019). Symptoms overall stable, her daughter would like to continue current regimen, continue Donepezil 10mg  daily, Mirtazapine 15mg  qhs, and Depakote ER 250mg  qhs. Follow-up in 6 months, call for any changes.    Thank you for allowing me to participate in her care.  Please do not hesitate to call for any questions or concerns.   Ellouise Newer, M.D.   CC: Dr. [name]

## 2021-07-02 NOTE — Patient Instructions (Signed)
Good to see you! Continue all your medications. Follow-up in 6 months, call for any changes  FALL PRECAUTIONS: Be cautious when walking. Scan the area for obstacles that may increase the risk of trips and falls. When getting up in the mornings, sit up at the edge of the bed for a few minutes before getting out of bed. Consider elevating the bed at the head end to avoid drop of blood pressure when getting up. Walk always in a well-lit room (use night lights in the walls). Avoid area rugs or power cords from appliances in the middle of the walkways. Use a walker or a cane if necessary and consider physical therapy for balance exercise. Get your eyesight checked regularly.  HOME SAFETY: Consider the safety of the kitchen when operating appliances like stoves, microwave oven, and blender. Consider having supervision and share cooking responsibilities until no longer able to participate in those. Accidents with firearms and other hazards in the house should be identified and addressed as well.  ABILITY TO BE LEFT ALONE: If patient is unable to contact 911 operator, consider using LifeLine, or when the need is there, arrange for someone to stay with patients. Smoking is a fire hazard, consider supervision or cessation. Risk of wandering should be assessed by caregiver and if detected at any point, supervision and safe proof recommendations should be instituted.  RECOMMENDATIONS FOR ALL PATIENTS WITH MEMORY PROBLEMS: 1. Continue to exercise (Recommend 30 minutes of walking everyday, or 3 hours every week) 2. Increase social interactions - continue going to [place] and enjoy social gatherings with friends and family 3. Eat healthy, avoid fried foods and eat more fruits and vegetables 4. Maintain adequate blood pressure, blood sugar, and blood cholesterol level. Reducing the risk of stroke and cardiovascular disease also helps promoting better memory. 5. Avoid stressful situations. Live a simple life and avoid  aggravations. Organize your time and prepare for the next day in anticipation. 6. Sleep well, avoid any interruptions of sleep and avoid any distractions in the bedroom that may interfere with adequate sleep quality 7. Avoid sugar, avoid sweets as there is a strong link between excessive sugar intake, diabetes, and cognitive impairment The Mediterranean diet has been shown to help patients reduce the risk of progressive memory disorders and reduces cardiovascular risk. This includes eating fish, eat fruits and green leafy vegetables, nuts like almonds and hazelnuts, walnuts, and also use olive oil. Avoid fast foods and fried foods as much as possible. Avoid sweets and sugar as sugar use has been linked to worsening of memory function.  There is always a concern of gradual progression of memory problems. If this is the case, then we may need to adjust level of care according to patient needs. Support, both to the patient and caregiver, should then be put into place.

## 2021-07-06 ENCOUNTER — Inpatient Hospital Stay: Payer: Medicare PPO | Attending: Internal Medicine

## 2021-07-06 ENCOUNTER — Ambulatory Visit (HOSPITAL_COMMUNITY)
Admission: RE | Admit: 2021-07-06 | Discharge: 2021-07-06 | Disposition: A | Discharge status: Home / Self Care | Payer: Medicare PPO | Source: Ambulatory Visit | Attending: Internal Medicine | Admitting: Internal Medicine

## 2021-07-06 ENCOUNTER — Other Ambulatory Visit: Payer: Self-pay

## 2021-07-06 DIAGNOSIS — I509 Heart failure, unspecified: Secondary | ICD-10-CM | POA: Insufficient documentation

## 2021-07-06 DIAGNOSIS — I2510 Atherosclerotic heart disease of native coronary artery without angina pectoris: Secondary | ICD-10-CM | POA: Diagnosis not present

## 2021-07-06 DIAGNOSIS — J479 Bronchiectasis, uncomplicated: Secondary | ICD-10-CM | POA: Diagnosis not present

## 2021-07-06 DIAGNOSIS — C3431 Malignant neoplasm of lower lobe, right bronchus or lung: Secondary | ICD-10-CM | POA: Insufficient documentation

## 2021-07-06 DIAGNOSIS — Z79899 Other long term (current) drug therapy: Secondary | ICD-10-CM | POA: Insufficient documentation

## 2021-07-06 DIAGNOSIS — M8580 Other specified disorders of bone density and structure, unspecified site: Secondary | ICD-10-CM | POA: Diagnosis not present

## 2021-07-06 DIAGNOSIS — C3490 Malignant neoplasm of unspecified part of unspecified bronchus or lung: Secondary | ICD-10-CM

## 2021-07-06 DIAGNOSIS — I700 Atherosclerosis of aorta: Secondary | ICD-10-CM | POA: Diagnosis not present

## 2021-07-06 DIAGNOSIS — I110 Hypertensive heart disease with heart failure: Secondary | ICD-10-CM | POA: Diagnosis not present

## 2021-07-06 DIAGNOSIS — I420 Dilated cardiomyopathy: Secondary | ICD-10-CM | POA: Insufficient documentation

## 2021-07-06 DIAGNOSIS — F0390 Unspecified dementia without behavioral disturbance: Secondary | ICD-10-CM | POA: Insufficient documentation

## 2021-07-06 DIAGNOSIS — E119 Type 2 diabetes mellitus without complications: Secondary | ICD-10-CM | POA: Insufficient documentation

## 2021-07-06 DIAGNOSIS — E785 Hyperlipidemia, unspecified: Secondary | ICD-10-CM | POA: Diagnosis not present

## 2021-07-06 DIAGNOSIS — K449 Diaphragmatic hernia without obstruction or gangrene: Secondary | ICD-10-CM | POA: Insufficient documentation

## 2021-07-06 DIAGNOSIS — Z7982 Long term (current) use of aspirin: Secondary | ICD-10-CM | POA: Diagnosis not present

## 2021-07-06 LAB — CBC WITH DIFFERENTIAL (CANCER CENTER ONLY)
Abs Immature Granulocytes: 0.02 10*3/uL (ref 0.00–0.07)
Basophils Absolute: 0 10*3/uL (ref 0.0–0.1)
Basophils Relative: 0 %
Eosinophils Absolute: 0.2 10*3/uL (ref 0.0–0.5)
Eosinophils Relative: 3 %
HCT: 36.7 % (ref 36.0–46.0)
Hemoglobin: 12.1 g/dL (ref 12.0–15.0)
Immature Granulocytes: 0 %
Lymphocytes Relative: 14 %
Lymphs Abs: 1 10*3/uL (ref 0.7–4.0)
MCH: 30.3 pg (ref 26.0–34.0)
MCHC: 33 g/dL (ref 30.0–36.0)
MCV: 91.8 fL (ref 80.0–100.0)
Monocytes Absolute: 0.8 10*3/uL (ref 0.1–1.0)
Monocytes Relative: 11 %
Neutro Abs: 5.1 10*3/uL (ref 1.7–7.7)
Neutrophils Relative %: 72 %
Platelet Count: 192 10*3/uL (ref 150–400)
RBC: 4 MIL/uL (ref 3.87–5.11)
RDW: 14 % (ref 11.5–15.5)
WBC Count: 7.1 10*3/uL (ref 4.0–10.5)
nRBC: 0 % (ref 0.0–0.2)

## 2021-07-06 LAB — CMP (CANCER CENTER ONLY)
ALT: 9 U/L (ref 0–44)
AST: 17 U/L (ref 15–41)
Albumin: 3.2 g/dL — ABNORMAL LOW (ref 3.5–5.0)
Alkaline Phosphatase: 76 U/L (ref 38–126)
Anion gap: 11 (ref 5–15)
BUN: 20 mg/dL (ref 8–23)
CO2: 24 mmol/L (ref 22–32)
Calcium: 9 mg/dL (ref 8.9–10.3)
Chloride: 108 mmol/L (ref 98–111)
Creatinine: 1.19 mg/dL — ABNORMAL HIGH (ref 0.44–1.00)
GFR, Estimated: 43 mL/min — ABNORMAL LOW (ref >60)
Glucose, Bld: 160 mg/dL — ABNORMAL HIGH (ref 70–99)
Potassium: 4.2 mmol/L (ref 3.5–5.1)
Sodium: 143 mmol/L (ref 135–145)
Total Bilirubin: 0.4 mg/dL (ref 0.3–1.2)
Total Protein: 7.7 g/dL (ref 6.5–8.1)

## 2021-07-09 ENCOUNTER — Other Ambulatory Visit: Payer: Medicare PPO

## 2021-07-09 DIAGNOSIS — L603 Nail dystrophy: Secondary | ICD-10-CM | POA: Diagnosis not present

## 2021-07-09 DIAGNOSIS — E1151 Type 2 diabetes mellitus with diabetic peripheral angiopathy without gangrene: Secondary | ICD-10-CM | POA: Diagnosis not present

## 2021-07-09 DIAGNOSIS — L84 Corns and callosities: Secondary | ICD-10-CM | POA: Diagnosis not present

## 2021-07-09 DIAGNOSIS — I739 Peripheral vascular disease, unspecified: Secondary | ICD-10-CM | POA: Diagnosis not present

## 2021-07-10 ENCOUNTER — Other Ambulatory Visit: Payer: Self-pay

## 2021-07-10 ENCOUNTER — Inpatient Hospital Stay: Payer: Medicare PPO | Admitting: Internal Medicine

## 2021-07-10 VITALS — BP 145/81 | HR 93 | Temp 97.3°F | Resp 18 | Wt 123.1 lb

## 2021-07-10 DIAGNOSIS — C3491 Malignant neoplasm of unspecified part of right bronchus or lung: Secondary | ICD-10-CM

## 2021-07-10 DIAGNOSIS — I700 Atherosclerosis of aorta: Secondary | ICD-10-CM | POA: Diagnosis not present

## 2021-07-10 DIAGNOSIS — E119 Type 2 diabetes mellitus without complications: Secondary | ICD-10-CM | POA: Diagnosis not present

## 2021-07-10 DIAGNOSIS — K449 Diaphragmatic hernia without obstruction or gangrene: Secondary | ICD-10-CM | POA: Diagnosis not present

## 2021-07-10 DIAGNOSIS — C3431 Malignant neoplasm of lower lobe, right bronchus or lung: Secondary | ICD-10-CM | POA: Diagnosis not present

## 2021-07-10 DIAGNOSIS — I2510 Atherosclerotic heart disease of native coronary artery without angina pectoris: Secondary | ICD-10-CM | POA: Diagnosis not present

## 2021-07-10 DIAGNOSIS — F0390 Unspecified dementia without behavioral disturbance: Secondary | ICD-10-CM | POA: Diagnosis not present

## 2021-07-10 DIAGNOSIS — I420 Dilated cardiomyopathy: Secondary | ICD-10-CM | POA: Diagnosis not present

## 2021-07-10 DIAGNOSIS — E785 Hyperlipidemia, unspecified: Secondary | ICD-10-CM | POA: Diagnosis not present

## 2021-07-10 DIAGNOSIS — I509 Heart failure, unspecified: Secondary | ICD-10-CM | POA: Diagnosis not present

## 2021-07-10 NOTE — Progress Notes (Signed)
[place] [phone]   [phone]  OFFICE PROGRESS NOTE  [name], MD [address] [place] [address]  DIAGNOSIS: Stage IA (T1b, N0, M0) non-small cell lung cancer consistent with adenocarcinoma involving the right lower lobe, diagnosed in September 2014.  PRIOR THERAPY: Status post curative stereotactic radiotherapy to the right lower lobe lung nodule under the care of Dr. Tammi Klippel completed on 10/29/2013.  CURRENT THERAPY: Observation.  INTERVAL HISTORY: Joan Hunter 87 y.o. female returns to the clinic today for annual follow-up visit accompanied by her daughter.  The patient is feeling fine today with no concerning complaints except for occasional shortness of breath with exertion.  She denied having any current chest pain, cough or hemoptysis.  She denied having any fever or chills.  She has no nausea, vomiting, diarrhea or constipation.  She has no headache or visual changes.  She had repeat CT scan of the chest performed recently and she is here for evaluation and discussion of her risk her results.  MEDICAL HISTORY: Past Medical History:  Diagnosis Date   Allergic rhinitis    Arthritis    CAD (coronary artery disease)    mod 2V CAD (60% LAD, 70-80% mid CX) by 01/2008 cath   CHF (congestive heart failure) (HCC)    Dementia (HCC)    Diabetes mellitus    dx 10 plus yrs ago   Dilated cardiomyopathy ([place])    now resolved, EF 60-65% echo 10/11/11 (Dr. Marlou Porch)   Diverticulosis    Hyperlipidemia    Hypertension    Hypokalemia 01/09/2017   Lung cancer ([place]) dx'd 06/2012   Lung mass    MR (mitral regurgitation)    Osteopenia     ALLERGIES:  is allergic to moxifloxacin, penicillins, and prednisone.  MEDICATIONS:  Current Outpatient Medications  Medication Sig Dispense Refill   amLODipine (NORVASC) 5 MG tablet TAKE 1 TABLET BY MOUTH EVERYDAY AT BEDTIME 90 tablet 2   aspirin 81 MG tablet Take 81 mg by  mouth daily.     atorvastatin (LIPITOR) 10 MG tablet Take 1 tablet (10 mg total) by mouth every other day. 45 tablet 3   divalproex (DEPAKOTE ER) 250 MG 24 hr tablet Take 1 tablet every night 90 tablet 3   donepezil (ARICEPT) 10 MG tablet TAKE 1 TABLET BY MOUTH EVERY DAY 90 tablet 3   furosemide (LASIX) 40 MG tablet TAKE 1 TABLET BY MOUTH EVERY OTHER DAY 45 tablet 2   levalbuterol (XOPENEX HFA) 45 MCG/ACT inhaler INHALE 2 PUFFS EVERY 4-6 HRS AS NEEDED (Patient not taking: Reported on 07/02/2021)  3   metoprolol succinate (TOPROL-XL) 100 MG 24 hr tablet TAKE 1 TABLET BY MOUTH DAILY. TAKE WITH OR IMMEDIATELY FOLLOWING A MEAL. 90 tablet 2   mirtazapine (REMERON) 15 MG tablet Take 1 tablet (15 mg total) by mouth at bedtime. 90 tablet 3   pantoprazole (PROTONIX) 40 MG tablet Take 40 mg by mouth daily.      potassium chloride 20 MEQ/15ML (10%) SOLN Take 15 mLs (20 mEq total) by mouth daily. 473 mL 11   PROAIR HFA 108 (90 Base) MCG/ACT inhaler Inhale 2 puffs into the lungs every 6 (six) hours as needed for wheezing or shortness of breath.  (Patient not taking: Reported on 07/02/2021)  6   No current facility-administered medications for this visit.    SURGICAL HISTORY:  Past Surgical History:  Procedure Laterality Date   ABDOMINAL HYSTERECTOMY  APPENDECTOMY     EYE SURGERY     bil cataracts   KNEE ARTHROSCOPY     ?  left  knee   TONSILLECTOMY      REVIEW OF SYSTEMS:  A comprehensive review of systems was negative except for: Respiratory: positive for dyspnea on exertion   PHYSICAL EXAMINATION: General appearance: alert, cooperative, fatigued, and no distress Head: Normocephalic, without obvious abnormality, atraumatic Neck: no adenopathy, no JVD, supple, symmetrical, trachea midline, and thyroid not enlarged, symmetric, no tenderness/mass/nodules Lymph nodes: Cervical, supraclavicular, and axillary nodes normal. Resp: clear to auscultation bilaterally Back: symmetric, no curvature. ROM  normal. No CVA tenderness. Cardio: regular rate and rhythm, S1, S2 normal, no murmur, click, rub or gallop GI: soft, non-tender; bowel sounds normal; no masses,  no organomegaly Extremities: extremities normal, atraumatic, no cyanosis or edema  ECOG PERFORMANCE STATUS: 1 - Symptomatic but completely ambulatory  Blood pressure (!) 145/81, pulse 93, temperature (!) 97.3 F (36.3 C), temperature source Tympanic, resp. rate 18, weight 123 lb 2 oz (55.8 kg), SpO2 98 %.  LABORATORY DATA: Lab Results  Component Value Date   WBC 7.1 07/06/2021   HGB 12.1 07/06/2021   HCT 36.7 07/06/2021   MCV 91.8 07/06/2021   PLT 192 07/06/2021      Chemistry      Component Value Date/Time   NA 143 07/06/2021 1432   NA 144 12/26/2016 1022   K 4.2 07/06/2021 1432   K 2.9 (LL) 12/26/2016 1022   CL 108 07/06/2021 1432   CO2 24 07/06/2021 1432   CO2 29 12/26/2016 1022   BUN 20 07/06/2021 1432   BUN 16.7 12/26/2016 1022   CREATININE 1.19 (H) 07/06/2021 1432   CREATININE 1.0 12/26/2016 1022      Component Value Date/Time   CALCIUM 9.0 07/06/2021 1432   CALCIUM 9.3 12/26/2016 1022   ALKPHOS 76 07/06/2021 1432   ALKPHOS 105 12/26/2016 1022   AST 17 07/06/2021 1432   AST 16 12/26/2016 1022   ALT 9 07/06/2021 1432   ALT 8 12/26/2016 1022   BILITOT 0.4 07/06/2021 1432   BILITOT 0.84 12/26/2016 1022       RADIOGRAPHIC STUDIES: CT Chest Wo Contrast  Result Date: 07/09/2021 CLINICAL DATA:  Lung cancer. EXAM: CT CHEST WITHOUT CONTRAST TECHNIQUE: Multidetector CT imaging of the chest was performed following the standard protocol without IV contrast. COMPARISON:  07/10/2020. FINDINGS: Cardiovascular: Atherosclerotic calcification of the aorta, aortic valve and coronary arteries. Heart is enlarged. Small amount of pericardial fluid is unchanged and likely physiologic. Mediastinum/Nodes: Mediastinal lymph nodes are not enlarged by CT size criteria. Hilar regions are difficult to evaluate without IV  contrast. No axillary adenopathy. Mid esophageal wall thickening. Moderate to large hiatal hernia. Lungs/Pleura: Post radiation parenchymal retraction, bronchiectasis and architectural distortion in the medial right hemithorax, similar. Scattered small nodular densities are again seen in the lungs bilaterally, unchanged. Additional subpleural scarring in the posterior right lower lobe. No pleural fluid. Airway is otherwise unremarkable. Upper Abdomen: Visualized portions of the liver, adrenal glands and right kidney are unremarkable. Subcentimeter low-attenuation lesion in the left kidney is too small to characterize. Visualized portions of the spleen, pancreas, stomach and bowel are unremarkable with the exception of a moderate to large hiatal hernia. Musculoskeletal: Degenerative changes in the spine. Severe kyphosis. T5 and T6 compression deformities, unchanged. Old left rib fractures. IMPRESSION: 1. No evidence of recurrent or metastatic disease. 2. Moderate to large hiatal hernia. 3. Aortic atherosclerosis (ICD10-I70.0). Coronary artery calcification. Electronically  Signed   By: Lorin Picket M.D.   On: 07/09/2021 10:53    ASSESSMENT AND PLAN:  This is a very pleasant 87 years old African-American female with stage IA non-small cell lung cancer. She status post curative SBRT to the right lower lobe nodule completed in November 2014. The patient is currently on observation and she is feeling fine today with no concerning complaints except for mild fatigue and shortness of breath with exertion. She had repeat CT scan of the chest performed recently.  I personally and independently reviewed the scans and discussed the results with the patient and her daughter. Her scan showed no concerning findings for disease recurrence or metastasis. I recommended for her to continue on observation with routine follow-up visit by her primary care physician from now on.  She has been on observation for almost 8 years  now. I will see the patient on as-needed basis. She was advised to call if she has any concerning symptoms. The patient voices understanding of current disease status and treatment options and is in agreement with the current care plan. All questions were answered. The patient knows to call the clinic with any problems, questions or concerns. We can certainly see the patient much sooner if necessary.  Disclaimer: This note was dictated with voice recognition software. Similar sounding words can inadvertently be transcribed and may not be corrected upon review.

## 2021-07-17 DIAGNOSIS — E1165 Type 2 diabetes mellitus with hyperglycemia: Secondary | ICD-10-CM | POA: Diagnosis not present

## 2021-07-17 DIAGNOSIS — N1831 Chronic kidney disease, stage 3a: Secondary | ICD-10-CM | POA: Diagnosis not present

## 2021-07-17 DIAGNOSIS — C3491 Malignant neoplasm of unspecified part of right bronchus or lung: Secondary | ICD-10-CM | POA: Diagnosis not present

## 2021-07-17 DIAGNOSIS — I10 Essential (primary) hypertension: Secondary | ICD-10-CM | POA: Diagnosis not present

## 2021-07-17 DIAGNOSIS — F0390 Unspecified dementia without behavioral disturbance: Secondary | ICD-10-CM | POA: Diagnosis not present

## 2021-07-17 DIAGNOSIS — E1122 Type 2 diabetes mellitus with diabetic chronic kidney disease: Secondary | ICD-10-CM | POA: Diagnosis not present

## 2021-09-20 ENCOUNTER — Ambulatory Visit (HOSPITAL_BASED_OUTPATIENT_CLINIC_OR_DEPARTMENT_OTHER): Payer: Medicare PPO | Admitting: Cardiology

## 2021-09-20 ENCOUNTER — Encounter (HOSPITAL_BASED_OUTPATIENT_CLINIC_OR_DEPARTMENT_OTHER): Payer: Self-pay | Admitting: Cardiology

## 2021-09-20 ENCOUNTER — Other Ambulatory Visit: Payer: Self-pay

## 2021-09-20 VITALS — BP 110/70 | HR 94 | Ht 64.0 in | Wt 112.0 lb

## 2021-09-20 DIAGNOSIS — I420 Dilated cardiomyopathy: Secondary | ICD-10-CM | POA: Diagnosis not present

## 2021-09-20 DIAGNOSIS — I444 Left anterior fascicular block: Secondary | ICD-10-CM | POA: Diagnosis not present

## 2021-09-20 DIAGNOSIS — I2510 Atherosclerotic heart disease of native coronary artery without angina pectoris: Secondary | ICD-10-CM | POA: Diagnosis not present

## 2021-09-20 DIAGNOSIS — I10 Essential (primary) hypertension: Secondary | ICD-10-CM | POA: Diagnosis not present

## 2021-09-20 DIAGNOSIS — I340 Nonrheumatic mitral (valve) insufficiency: Secondary | ICD-10-CM

## 2021-09-20 DIAGNOSIS — C3491 Malignant neoplasm of unspecified part of right bronchus or lung: Secondary | ICD-10-CM | POA: Diagnosis not present

## 2021-09-20 DIAGNOSIS — I2583 Coronary atherosclerosis due to lipid rich plaque: Secondary | ICD-10-CM

## 2021-09-20 NOTE — Patient Instructions (Signed)

## 2021-09-20 NOTE — Assessment & Plan Note (Signed)
Radiation only.  Excellent results.  Remission.  Dr. Earlie Server.  Notes reviewed

## 2021-09-20 NOTE — Progress Notes (Signed)
Cardiology Office Note:    Date:  09/20/2021   ID:  Joan Hunter, DOB 08/23/1927, MRN [id number]  PCP:  [name], MD  [facility] Cardiologist:  Candee Furbish, MD  [facility] Electrophysiologist:  None   Referring MD: [name], MD   History of Present Illness:    Joan Hunter is a 87 y.o. female here for the follow-up of coronary artery disease, congestive heart failure, and hypertension.  Hx of moderate nonflow limiting coronary artery disease, prior cardiomyopathy EF 15% now resolved with mitral regurgitation.   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 Dr. Tammi Klippel. Radiation only.    Dr. Melvyn Novas changed Coreg to Bystolic to bisoprolol, but she is now back on Toprol. COPD/bronchitis. Treating hiatal hernia with Protonix as well.    Continuing with atorvastatin, hyperlipidemia.   She experienced some dizziness when standing and her medications were previously reduced. This has improved.   Her daughter, Joan Hunter, helps her out significantly but she also makes sure that she gets up and moves around adequately.    I encouraged her to use her walker. She has a split-level house. Concerned about stair use.   NYHA class I-II symptoms.  Has been seen by neurology.  Note reviewed.  Moderate memory impairment.  Does not seem to have any orthopnea or PND.  Today: She is accompanied by a family member. Overall she is feeling alright. Lately her breathing has been stable. Generally she has been sleeping often. At night she wheezes occasionally.  Her appetite fluctuates. She may eat more on some days and not much on others. They do try to give her 1 Ensure a day.  There have been no falls or bleeding issues.  She denies any palpitations, or chest pain. No lightheadedness, headaches, syncope, orthopnea, or PND. Also has no lower extremity edema or exertional symptoms.  Past Medical History:   Diagnosis Date   Allergic rhinitis    Arthritis    CAD (coronary artery disease)    mod 2V CAD (60% LAD, 70-80% mid CX) by 01/2008 cath   CHF (congestive heart failure) (HCC)    Dementia (HCC)    Diabetes mellitus    dx 10 plus yrs ago   Dilated cardiomyopathy ([place])    now resolved, EF 60-65% echo 10/11/11 (Dr. Marlou Porch)   Diverticulosis    Hyperlipidemia    Hypertension    Hypokalemia 01/09/2017   Lung cancer ([place]) dx'd 06/2012   Lung mass    MR (mitral regurgitation)    Osteopenia     Past Surgical History:  Procedure Laterality Date   ABDOMINAL HYSTERECTOMY     APPENDECTOMY     EYE SURGERY     bil cataracts   KNEE ARTHROSCOPY     ?  left  knee   TONSILLECTOMY      Current Medications: Current Meds  Medication Sig   amLODipine (NORVASC) 5 MG tablet TAKE 1 TABLET BY MOUTH EVERYDAY AT BEDTIME   aspirin 81 MG tablet Take 81 mg by mouth daily.   atorvastatin (LIPITOR) 10 MG tablet Take 1 tablet (10 mg total) by mouth every other day.   divalproex (DEPAKOTE ER) 250 MG 24 hr tablet Take 1 tablet every night   donepezil (ARICEPT) 10 MG tablet TAKE 1 TABLET BY MOUTH EVERY DAY   furosemide (LASIX) 40 MG tablet TAKE 1 TABLET BY MOUTH EVERY OTHER DAY   levalbuterol (XOPENEX HFA) 45 MCG/ACT inhaler  metoprolol succinate (TOPROL-XL) 100 MG 24 hr tablet TAKE 1 TABLET BY MOUTH DAILY. TAKE WITH OR IMMEDIATELY FOLLOWING A MEAL.   mirtazapine (REMERON) 15 MG tablet Take 1 tablet (15 mg total) by mouth at bedtime.   pantoprazole (PROTONIX) 40 MG tablet Take 40 mg by mouth daily.    potassium chloride 20 MEQ/15ML (10%) SOLN Take 15 mLs (20 mEq total) by mouth daily.   PROAIR HFA 108 (90 Base) MCG/ACT inhaler Inhale 2 puffs into the lungs every 6 (six) hours as needed for wheezing or shortness of breath.     Allergies:   Moxifloxacin, Penicillins, and Prednisone   Social History   Socioeconomic History   Marital status: Widowed    Spouse name: Not on file   Number of children: Not  on file   Years of education: Not on file   Highest education level: Not on file  Occupational History   Not on file  Tobacco Use   Smoking status: Former    Packs/day: 0.50    Years: 5.00    Pack years: 2.50    Types: Cigarettes    Quit date: 06/15/1972    Years since quitting: 49.2   Smokeless tobacco: Never  Vaping Use   Vaping Use: Never used  Substance and Sexual Activity   Alcohol use: No   Drug use: No   Sexual activity: Not Currently  Other Topics Concern   Not on file  Social History Narrative   Daughter resides with patient and is her primary care giver.      12th grade      Right handed      Social Determinants of Health   Financial Resource Strain: Not on file  Food Insecurity: Not on file  Transportation Needs: Not on file  Physical Activity: Not on file  Stress: Not on file  Social Connections: Not on file     Family History: The patient's family history includes Colon cancer in her mother; Heart disease in her brother.  ROS:   Please see the history of present illness.    (+) Loss of appetite (+) Nocturnal wheezing All other systems reviewed and are negative.  EKGs/Labs/Other Studies Reviewed:    The following studies were reviewed today:  CT Chest 07/06/2021: FINDINGS: Cardiovascular: Atherosclerotic calcification of the aorta, aortic valve and coronary arteries. Heart is enlarged. Small amount of pericardial fluid is unchanged and likely physiologic.   Mediastinum/Nodes: Mediastinal lymph nodes are not enlarged by CT size criteria. Hilar regions are difficult to evaluate without IV contrast. No axillary adenopathy. Mid esophageal wall thickening. Moderate to large hiatal hernia.   Lungs/Pleura: Post radiation parenchymal retraction, bronchiectasis and architectural distortion in the medial right hemithorax, similar. Scattered small nodular densities are again seen in the lungs bilaterally, unchanged. Additional subpleural scarring in  the posterior right lower lobe. No pleural fluid. Airway is otherwise unremarkable.   Upper Abdomen: Visualized portions of the liver, adrenal glands and right kidney are unremarkable. Subcentimeter low-attenuation lesion in the left kidney is too small to characterize. Visualized portions of the spleen, pancreas, stomach and bowel are unremarkable with the exception of a moderate to large hiatal hernia.   Musculoskeletal: Degenerative changes in the spine. Severe kyphosis. T5 and T6 compression deformities, unchanged. Old left rib fractures.   IMPRESSION: 1. No evidence of recurrent or metastatic disease. 2. Moderate to large hiatal hernia. 3. Aortic atherosclerosis (ICD10-I70.0). Coronary artery calcification.  TTE 05/01/2017: - Left ventricle: The cavity size was normal. Systolic function  was    normal. The estimated ejection fraction was in the range of 55%    to 60%. Wall motion was normal; there were no regional wall    motion abnormalities. Doppler parameters are consistent with    abnormal left ventricular relaxation (grade 1 diastolic    dysfunction).   EKG:  EKG is personally reviewed and interpreted. 09/20/2021: Sinus rhythm. Rate 94 bpm. LAFB. 11/08/2020: sinus rhythm 99 left anterior fascicular block  Recent Labs: 07/06/2021: ALT 9; BUN 20; Creatinine 1.19; Hemoglobin 12.1; Platelet Count 192; Potassium 4.2; Sodium 143   Recent Lipid Panel    Component Value Date/Time   CHOL 143 06/15/2012 0943   TRIG 83 06/15/2012 0943   HDL 62 06/15/2012 0943   CHOLHDL 2.3 06/15/2012 0943   VLDL 17 06/15/2012 0943   LDLCALC 64 06/15/2012 0943    Physical Exam:    VS:  BP 110/70 (BP Location: Left Arm, Patient Position: Sitting, Cuff Size: Normal)   Pulse 94   Ht 5\' 4"  (1.626 m)   Wt 112 lb (50.8 kg)   BMI 19.22 kg/m     Wt Readings from Last 3 Encounters:  09/20/21 112 lb (50.8 kg)  07/10/21 123 lb 2 oz (55.8 kg)  07/02/21 104 lb (47.2 kg)     GEN: Well  nourished, well developed in no acute distress HEENT: Normal NECK: No JVD; No carotid bruits LYMPHATICS: No lymphadenopathy CARDIAC: RRR, no murmurs, rubs, gallops RESPIRATORY:  Clear to auscultation without rales, wheezing or rhonchi  ABDOMEN: Soft, non-tender, non-distended MUSCULOSKELETAL:  No edema; No deformity  SKIN: Warm and dry NEUROLOGIC:  Alert and oriented x 3 PSYCHIATRIC:  Normal affect   ASSESSMENT:    1. Essential hypertension   2. Coronary artery disease due to lipid rich plaque   3. Dilated cardiomyopathy ([place])   4. Nonrheumatic mitral valve regurgitation   5. Left anterior fascicular block   6. Non-small cell cancer of right lung (HCC)     PLAN:    In order of problems listed above: Dilated cardiomyopathy ([place]) EF previously 15% now normal.  Doing well on Toprol 100.  No changes made.  Mitral regurgitation After cardiomyopathy result, her functional mitral regurgitation was also improved to mild  Left anterior fascicular block May warrant pacemaker in the future if further conduction system disorder takes place  Non-small cell cancer of right lung (HCC) Radiation only.  Excellent results.  Remission.  Dr. Earlie Server.  Notes reviewed  CAD (coronary artery disease) mod 2V CAD (60% LAD, 70-80% mid CX) by 01/2008 cath   Follow-up:   6 months.   Medication Adjustments/Labs and Tests Ordered: Current medicines are reviewed at length with the patient today.  Concerns regarding medicines are outlined above.   Orders Placed This Encounter  Procedures   EKG 12-Lead    No orders of the defined types were placed in this encounter.  Patient Instructions  Medication Instructions:  The current medical regimen is effective;  continue present plan and medications.  *If you need a refill on your cardiac medications before your next appointment, please call your pharmacy*  Follow-Up: At [facility], you and your health needs are our priority.  As part of our  continuing mission to provide you with exceptional heart care, we have created designated Provider Care Teams.  These Care Teams include your primary Cardiologist (physician) and Advanced Practice Providers (APPs -  Physician Assistants and Nurse Practitioners) who all work together to provide you with the care you  need, when you need it.  We recommend signing up for the patient portal called "MyChart".  Sign up information is provided on this After Visit Summary.  MyChart is used to connect with patients for Virtual Visits (Telemedicine).  Patients are able to view lab/test results, encounter notes, upcoming appointments, etc.  Non-urgent messages can be sent to your provider as well.   To learn more about what you can do with MyChart, go to [email or web address].    Your next appointment:   6 month(s)  The format for your next appointment:   In Person  Provider:   Candee Furbish, MD  Thank you for choosing [facility]!!     I,Mathew Stumpf,acting as a scribe for Candee Furbish, MD.,have documented all relevant documentation on the behalf of Candee Furbish, MD,as directed by  Candee Furbish, MD while in the presence of Candee Furbish, MD.  I, Candee Furbish, MD, have reviewed all documentation for this visit. The documentation on 09/20/21 for the exam, diagnosis, procedures, and orders are all accurate and complete.   Signed, Candee Furbish, MD  09/20/2021 4:37 PM    [place]

## 2021-09-20 NOTE — Assessment & Plan Note (Signed)
EF previously 15% now normal.  Doing well on Toprol 100.  No changes made.

## 2021-09-20 NOTE — Assessment & Plan Note (Signed)
mod 2V CAD (60% LAD, 70-80% mid CX) by 01/2008 cath

## 2021-09-20 NOTE — Assessment & Plan Note (Signed)
May warrant pacemaker in the future if further conduction system disorder takes place

## 2021-09-20 NOTE — Assessment & Plan Note (Signed)
After cardiomyopathy result, her functional mitral regurgitation was also improved to mild

## 2021-10-08 DIAGNOSIS — I739 Peripheral vascular disease, unspecified: Secondary | ICD-10-CM | POA: Diagnosis not present

## 2021-10-08 DIAGNOSIS — E1151 Type 2 diabetes mellitus with diabetic peripheral angiopathy without gangrene: Secondary | ICD-10-CM | POA: Diagnosis not present

## 2021-10-08 DIAGNOSIS — L84 Corns and callosities: Secondary | ICD-10-CM | POA: Diagnosis not present

## 2021-10-08 DIAGNOSIS — L603 Nail dystrophy: Secondary | ICD-10-CM | POA: Diagnosis not present

## 2021-11-01 ENCOUNTER — Other Ambulatory Visit: Payer: Self-pay | Admitting: Cardiology

## 2021-11-21 ENCOUNTER — Other Ambulatory Visit: Payer: Self-pay | Admitting: Cardiology

## 2022-01-03 ENCOUNTER — Encounter: Payer: Self-pay | Admitting: Physician Assistant

## 2022-01-03 ENCOUNTER — Ambulatory Visit: Payer: Medicare PPO | Admitting: Physician Assistant

## 2022-01-03 ENCOUNTER — Emergency Department (HOSPITAL_COMMUNITY): Payer: Medicare PPO

## 2022-01-03 ENCOUNTER — Other Ambulatory Visit: Payer: Self-pay

## 2022-01-03 ENCOUNTER — Inpatient Hospital Stay (HOSPITAL_COMMUNITY)
Admission: EM | Admit: 2022-01-03 | Discharge: 2022-02-13 | DRG: 296 | Disposition: E | Payer: Medicare PPO | Attending: Pulmonary Disease | Admitting: Pulmonary Disease

## 2022-01-03 VITALS — BP 81/60 | HR 120 | Resp 20 | Ht 64.0 in | Wt 81.0 lb

## 2022-01-03 DIAGNOSIS — Z880 Allergy status to penicillin: Secondary | ICD-10-CM

## 2022-01-03 DIAGNOSIS — E876 Hypokalemia: Secondary | ICD-10-CM | POA: Diagnosis present

## 2022-01-03 DIAGNOSIS — L03114 Cellulitis of left upper limb: Secondary | ICD-10-CM | POA: Diagnosis not present

## 2022-01-03 DIAGNOSIS — N170 Acute kidney failure with tubular necrosis: Secondary | ICD-10-CM | POA: Diagnosis not present

## 2022-01-03 DIAGNOSIS — Z7982 Long term (current) use of aspirin: Secondary | ICD-10-CM

## 2022-01-03 DIAGNOSIS — J9601 Acute respiratory failure with hypoxia: Secondary | ICD-10-CM | POA: Diagnosis not present

## 2022-01-03 DIAGNOSIS — R627 Adult failure to thrive: Secondary | ICD-10-CM | POA: Diagnosis present

## 2022-01-03 DIAGNOSIS — Z66 Do not resuscitate: Secondary | ICD-10-CM | POA: Diagnosis not present

## 2022-01-03 DIAGNOSIS — F03B18 Unspecified dementia, moderate, with other behavioral disturbance: Secondary | ICD-10-CM

## 2022-01-03 DIAGNOSIS — E43 Unspecified severe protein-calorie malnutrition: Secondary | ICD-10-CM | POA: Diagnosis not present

## 2022-01-03 DIAGNOSIS — Z8249 Family history of ischemic heart disease and other diseases of the circulatory system: Secondary | ICD-10-CM

## 2022-01-03 DIAGNOSIS — I4891 Unspecified atrial fibrillation: Secondary | ICD-10-CM | POA: Diagnosis present

## 2022-01-03 DIAGNOSIS — Z4682 Encounter for fitting and adjustment of non-vascular catheter: Secondary | ICD-10-CM | POA: Diagnosis not present

## 2022-01-03 DIAGNOSIS — Z881 Allergy status to other antibiotic agents status: Secondary | ICD-10-CM

## 2022-01-03 DIAGNOSIS — E785 Hyperlipidemia, unspecified: Secondary | ICD-10-CM | POA: Diagnosis present

## 2022-01-03 DIAGNOSIS — I2510 Atherosclerotic heart disease of native coronary artery without angina pectoris: Secondary | ICD-10-CM | POA: Diagnosis present

## 2022-01-03 DIAGNOSIS — Z888 Allergy status to other drugs, medicaments and biological substances status: Secondary | ICD-10-CM

## 2022-01-03 DIAGNOSIS — K7200 Acute and subacute hepatic failure without coma: Secondary | ICD-10-CM | POA: Diagnosis not present

## 2022-01-03 DIAGNOSIS — E1165 Type 2 diabetes mellitus with hyperglycemia: Secondary | ICD-10-CM | POA: Diagnosis present

## 2022-01-03 DIAGNOSIS — E861 Hypovolemia: Secondary | ICD-10-CM | POA: Diagnosis present

## 2022-01-03 DIAGNOSIS — I420 Dilated cardiomyopathy: Secondary | ICD-10-CM | POA: Diagnosis present

## 2022-01-03 DIAGNOSIS — E870 Hyperosmolality and hypernatremia: Secondary | ICD-10-CM | POA: Diagnosis not present

## 2022-01-03 DIAGNOSIS — F0390 Unspecified dementia without behavioral disturbance: Secondary | ICD-10-CM | POA: Diagnosis present

## 2022-01-03 DIAGNOSIS — Z515 Encounter for palliative care: Secondary | ICD-10-CM

## 2022-01-03 DIAGNOSIS — E872 Acidosis, unspecified: Secondary | ICD-10-CM | POA: Diagnosis present

## 2022-01-03 DIAGNOSIS — Z923 Personal history of irradiation: Secondary | ICD-10-CM

## 2022-01-03 DIAGNOSIS — Z85118 Personal history of other malignant neoplasm of bronchus and lung: Secondary | ICD-10-CM

## 2022-01-03 DIAGNOSIS — I469 Cardiac arrest, cause unspecified: Secondary | ICD-10-CM | POA: Diagnosis not present

## 2022-01-03 DIAGNOSIS — F02B0 Dementia in other diseases classified elsewhere, moderate, without behavioral disturbance, psychotic disturbance, mood disturbance, and anxiety: Secondary | ICD-10-CM | POA: Diagnosis not present

## 2022-01-03 DIAGNOSIS — Z7189 Other specified counseling: Secondary | ICD-10-CM | POA: Diagnosis not present

## 2022-01-03 DIAGNOSIS — N390 Urinary tract infection, site not specified: Secondary | ICD-10-CM | POA: Diagnosis present

## 2022-01-03 DIAGNOSIS — Z87891 Personal history of nicotine dependence: Secondary | ICD-10-CM

## 2022-01-03 DIAGNOSIS — M8580 Other specified disorders of bone density and structure, unspecified site: Secondary | ICD-10-CM | POA: Diagnosis present

## 2022-01-03 DIAGNOSIS — R404 Transient alteration of awareness: Secondary | ICD-10-CM | POA: Diagnosis not present

## 2022-01-03 DIAGNOSIS — I499 Cardiac arrhythmia, unspecified: Secondary | ICD-10-CM | POA: Diagnosis not present

## 2022-01-03 DIAGNOSIS — J9600 Acute respiratory failure, unspecified whether with hypoxia or hypercapnia: Secondary | ICD-10-CM

## 2022-01-03 DIAGNOSIS — Z681 Body mass index (BMI) 19 or less, adult: Secondary | ICD-10-CM | POA: Diagnosis not present

## 2022-01-03 DIAGNOSIS — I110 Hypertensive heart disease with heart failure: Secondary | ICD-10-CM | POA: Diagnosis present

## 2022-01-03 DIAGNOSIS — I5022 Chronic systolic (congestive) heart failure: Secondary | ICD-10-CM | POA: Diagnosis present

## 2022-01-03 DIAGNOSIS — G931 Anoxic brain damage, not elsewhere classified: Secondary | ICD-10-CM | POA: Diagnosis present

## 2022-01-03 DIAGNOSIS — D6489 Other specified anemias: Secondary | ICD-10-CM | POA: Diagnosis not present

## 2022-01-03 DIAGNOSIS — R031 Nonspecific low blood-pressure reading: Secondary | ICD-10-CM | POA: Diagnosis not present

## 2022-01-03 DIAGNOSIS — E8721 Acute metabolic acidosis: Secondary | ICD-10-CM | POA: Diagnosis not present

## 2022-01-03 DIAGNOSIS — Z20822 Contact with and (suspected) exposure to covid-19: Secondary | ICD-10-CM | POA: Diagnosis not present

## 2022-01-03 DIAGNOSIS — Z978 Presence of other specified devices: Secondary | ICD-10-CM | POA: Diagnosis not present

## 2022-01-03 DIAGNOSIS — I517 Cardiomegaly: Secondary | ICD-10-CM | POA: Diagnosis not present

## 2022-01-03 DIAGNOSIS — R4182 Altered mental status, unspecified: Secondary | ICD-10-CM | POA: Diagnosis not present

## 2022-01-03 DIAGNOSIS — G319 Degenerative disease of nervous system, unspecified: Secondary | ICD-10-CM | POA: Diagnosis not present

## 2022-01-03 DIAGNOSIS — I4510 Unspecified right bundle-branch block: Secondary | ICD-10-CM | POA: Diagnosis not present

## 2022-01-03 DIAGNOSIS — J9690 Respiratory failure, unspecified, unspecified whether with hypoxia or hypercapnia: Secondary | ICD-10-CM | POA: Diagnosis not present

## 2022-01-03 DIAGNOSIS — A419 Sepsis, unspecified organism: Secondary | ICD-10-CM | POA: Diagnosis not present

## 2022-01-03 DIAGNOSIS — G301 Alzheimer's disease with late onset: Secondary | ICD-10-CM | POA: Diagnosis not present

## 2022-01-03 DIAGNOSIS — Z79899 Other long term (current) drug therapy: Secondary | ICD-10-CM

## 2022-01-03 DIAGNOSIS — S199XXA Unspecified injury of neck, initial encounter: Secondary | ICD-10-CM | POA: Diagnosis not present

## 2022-01-03 DIAGNOSIS — R739 Hyperglycemia, unspecified: Secondary | ICD-10-CM | POA: Diagnosis not present

## 2022-01-03 DIAGNOSIS — R579 Shock, unspecified: Secondary | ICD-10-CM | POA: Diagnosis present

## 2022-01-03 DIAGNOSIS — J90 Pleural effusion, not elsewhere classified: Secondary | ICD-10-CM | POA: Diagnosis not present

## 2022-01-03 LAB — COMPREHENSIVE METABOLIC PANEL
ALT: 179 U/L — ABNORMAL HIGH (ref 0–44)
AST: 239 U/L — ABNORMAL HIGH (ref 15–41)
Albumin: 2.4 g/dL — ABNORMAL LOW (ref 3.5–5.0)
Alkaline Phosphatase: 87 U/L (ref 38–126)
Anion gap: 27 — ABNORMAL HIGH (ref 5–15)
BUN: 44 mg/dL — ABNORMAL HIGH (ref 8–23)
CO2: 11 mmol/L — ABNORMAL LOW (ref 22–32)
Calcium: 8.6 mg/dL — ABNORMAL LOW (ref 8.9–10.3)
Chloride: 103 mmol/L (ref 98–111)
Creatinine, Ser: 2.49 mg/dL — ABNORMAL HIGH (ref 0.44–1.00)
GFR, Estimated: 18 mL/min — ABNORMAL LOW (ref >60)
Glucose, Bld: 359 mg/dL — ABNORMAL HIGH (ref 70–99)
Potassium: 3.3 mmol/L — ABNORMAL LOW (ref 3.5–5.1)
Sodium: 141 mmol/L (ref 135–145)
Total Bilirubin: 0.8 mg/dL (ref 0.3–1.2)
Total Protein: 6.1 g/dL — ABNORMAL LOW (ref 6.5–8.1)

## 2022-01-03 LAB — CBC WITH DIFFERENTIAL/PLATELET
Abs Immature Granulocytes: 0.68 10*3/uL — ABNORMAL HIGH (ref 0.00–0.07)
Basophils Absolute: 0.1 10*3/uL (ref 0.0–0.1)
Basophils Relative: 1 %
Eosinophils Absolute: 0.1 10*3/uL (ref 0.0–0.5)
Eosinophils Relative: 1 %
HCT: 39.1 % (ref 36.0–46.0)
Hemoglobin: 11.3 g/dL — ABNORMAL LOW (ref 12.0–15.0)
Immature Granulocytes: 7 %
Lymphocytes Relative: 25 %
Lymphs Abs: 2.4 10*3/uL (ref 0.7–4.0)
MCH: 29.7 pg (ref 26.0–34.0)
MCHC: 28.9 g/dL — ABNORMAL LOW (ref 30.0–36.0)
MCV: 102.9 fL — ABNORMAL HIGH (ref 80.0–100.0)
Monocytes Absolute: 0.6 10*3/uL (ref 0.1–1.0)
Monocytes Relative: 6 %
Neutro Abs: 5.9 10*3/uL (ref 1.7–7.7)
Neutrophils Relative %: 60 %
Platelets: 152 10*3/uL (ref 150–400)
RBC: 3.8 MIL/uL — ABNORMAL LOW (ref 3.87–5.11)
RDW: 14.4 % (ref 11.5–15.5)
WBC: 9.7 10*3/uL (ref 4.0–10.5)
nRBC: 0 % (ref 0.0–0.2)

## 2022-01-03 LAB — I-STAT ARTERIAL BLOOD GAS, ED
Acid-base deficit: 16 mmol/L — ABNORMAL HIGH (ref 0.0–2.0)
Acid-base deficit: 3 mmol/L — ABNORMAL HIGH (ref 0.0–2.0)
Bicarbonate: 11.7 mmol/L — ABNORMAL LOW (ref 20.0–28.0)
Bicarbonate: 21.6 mmol/L (ref 20.0–28.0)
Calcium, Ion: 1.1 mmol/L — ABNORMAL LOW (ref 1.15–1.40)
Calcium, Ion: 1.09 mmol/L — ABNORMAL LOW (ref 1.15–1.40)
HCT: 26 % — ABNORMAL LOW (ref 36.0–46.0)
HCT: 34 % — ABNORMAL LOW (ref 36.0–46.0)
Hemoglobin: 8.8 g/dL — ABNORMAL LOW (ref 12.0–15.0)
Hemoglobin: 11.6 g/dL — ABNORMAL LOW (ref 12.0–15.0)
O2 Saturation: 100 %
O2 Saturation: 99 %
Patient temperature: 92.9
Potassium: 3.5 mmol/L (ref 3.5–5.1)
Potassium: 3.2 mmol/L — ABNORMAL LOW (ref 3.5–5.1)
Sodium: 142 mmol/L (ref 135–145)
Sodium: 141 mmol/L (ref 135–145)
TCO2: 13 mmol/L — ABNORMAL LOW (ref 22–32)
TCO2: 23 mmol/L (ref 22–32)
pCO2 arterial: 35.3 mmHg (ref 32.0–48.0)
pCO2 arterial: 31.6 mmHg — ABNORMAL LOW (ref 32.0–48.0)
pH, Arterial: 7.128 — CL (ref 7.350–7.450)
pH, Arterial: 7.429 (ref 7.350–7.450)
pO2, Arterial: 278 mmHg — ABNORMAL HIGH (ref 83.0–108.0)
pO2, Arterial: 142 mmHg — ABNORMAL HIGH (ref 83.0–108.0)

## 2022-01-03 LAB — LACTIC ACID, PLASMA
Lactic Acid, Venous: >9 mmol/L (ref 0.5–1.9)
Lactic Acid, Venous: >9 mmol/L (ref 0.5–1.9)

## 2022-01-03 LAB — RESP PANEL BY RT-PCR (FLU A&B, COVID) ARPGX2
Influenza A by PCR: NEGATIVE
Influenza B by PCR: NEGATIVE
SARS Coronavirus 2 by RT PCR: NEGATIVE

## 2022-01-03 LAB — TROPONIN I (HIGH SENSITIVITY)
Troponin I (High Sensitivity): 47 ng/L — ABNORMAL HIGH (ref <18)
Troponin I (High Sensitivity): 105 ng/L (ref <18)

## 2022-01-03 LAB — I-STAT VENOUS BLOOD GAS, ED
Acid-base deficit: 17 mmol/L — ABNORMAL HIGH (ref 0.0–2.0)
Bicarbonate: 12.4 mmol/L — ABNORMAL LOW (ref 20.0–28.0)
Calcium, Ion: 1.01 mmol/L — ABNORMAL LOW (ref 1.15–1.40)
HCT: 36 % (ref 36.0–46.0)
Hemoglobin: 12.2 g/dL (ref 12.0–15.0)
O2 Saturation: 93 %
Potassium: 3.2 mmol/L — ABNORMAL LOW (ref 3.5–5.1)
Sodium: 140 mmol/L (ref 135–145)
TCO2: 14 mmol/L — ABNORMAL LOW (ref 22–32)
pCO2, Ven: 43.9 mmHg — ABNORMAL LOW (ref 44.0–60.0)
pH, Ven: 7.059 — CL (ref 7.250–7.430)
pO2, Ven: 93 mmHg — ABNORMAL HIGH (ref 32.0–45.0)

## 2022-01-03 LAB — GLUCOSE, CAPILLARY: Glucose-Capillary: 259 mg/dL — ABNORMAL HIGH (ref 70–99)

## 2022-01-03 MED ORDER — SODIUM CHLORIDE 0.9 % IV BOLUS
1000.0000 mL | Freq: Once | INTRAVENOUS | Status: AC
  Administered 2022-01-03: 1000 mL via INTRAVENOUS

## 2022-01-03 MED ORDER — POTASSIUM CHLORIDE 20 MEQ PO PACK
40.0000 meq | PACK | Freq: Once | ORAL | Status: AC
  Administered 2022-01-04: 40 meq
  Filled 2022-01-03: qty 2

## 2022-01-03 MED ORDER — NOREPINEPHRINE 4 MG/250ML-% IV SOLN
2.0000 ug/min | INTRAVENOUS | Status: DC
  Administered 2022-01-04: 7 ug/min via INTRAVENOUS
  Filled 2022-01-03: qty 250

## 2022-01-03 MED ORDER — NOREPINEPHRINE 4 MG/250ML-% IV SOLN
INTRAVENOUS | Status: AC
  Administered 2022-01-03: 3 ug/min via INTRAVENOUS
  Filled 2022-01-03: qty 250

## 2022-01-03 MED ORDER — ACETAMINOPHEN 325 MG PO TABS: 650.0000 mg | ORAL_TABLET | ORAL | Status: DC | PRN

## 2022-01-03 MED ORDER — HEPARIN SODIUM (PORCINE) 5000 UNIT/ML IJ SOLN
5000.0000 [IU] | Freq: Three times a day (TID) | INTRAMUSCULAR | Status: DC
  Administered 2022-01-04 – 2022-01-20 (×50): 5000 [IU] via SUBCUTANEOUS
  Filled 2022-01-03 (×49): qty 1

## 2022-01-03 MED ORDER — ETOMIDATE 2 MG/ML IV SOLN
INTRAVENOUS | Status: DC | PRN
Start: 2022-01-03 — End: 2022-01-03

## 2022-01-03 MED ORDER — SODIUM CHLORIDE 0.9 % IV SOLN
250.0000 mL | INTRAVENOUS | Status: DC
  Administered 2022-01-14: 100 mL via INTRAVENOUS
  Administered 2022-01-14 – 2022-01-15 (×2): 250 mL via INTRAVENOUS

## 2022-01-03 MED ORDER — FENTANYL CITRATE (PF) 100 MCG/2ML IJ SOLN
25.0000 ug | INTRAMUSCULAR | Status: DC | PRN
  Administered 2022-01-05: 25 ug via INTRAVENOUS
  Filled 2022-01-03: qty 2

## 2022-01-03 MED ORDER — FENTANYL CITRATE PF 50 MCG/ML IJ SOSY: 50.0000 ug | PREFILLED_SYRINGE | INTRAMUSCULAR | Status: DC | PRN

## 2022-01-03 MED ORDER — DONEPEZIL HCL 10 MG PO TABS: ORAL_TABLET | ORAL | 3 refills | Status: AC

## 2022-01-03 MED ORDER — MIDAZOLAM HCL 2 MG/2ML IJ SOLN: 1.0000 mg | INTRAMUSCULAR | Status: DC | PRN

## 2022-01-03 MED ORDER — SODIUM CHLORIDE 0.9 % IV SOLN
2.0000 g | INTRAVENOUS | Status: DC
  Administered 2022-01-04: 2 g via INTRAVENOUS
  Filled 2022-01-03: qty 2

## 2022-01-03 MED ORDER — ACETAMINOPHEN 160 MG/5ML PO SOLN
650.0000 mg | ORAL | Status: DC | PRN
  Administered 2022-01-08 – 2022-01-10 (×2): 650 mg
  Filled 2022-01-03 (×2): qty 20.3

## 2022-01-03 MED ORDER — LACTATED RINGERS IV SOLN: INTRAVENOUS | Status: DC

## 2022-01-03 MED ORDER — DIVALPROEX SODIUM ER 250 MG PO TB24: ORAL_TABLET | ORAL | 3 refills | Status: AC

## 2022-01-03 MED ORDER — LACTATED RINGERS IV BOLUS
500.0000 mL | Freq: Once | INTRAVENOUS | Status: AC
  Administered 2022-01-03: 500 mL via INTRAVENOUS

## 2022-01-03 MED ORDER — ONDANSETRON HCL 4 MG/2ML IJ SOLN: 4.0000 mg | Freq: Four times a day (QID) | INTRAMUSCULAR | Status: DC | PRN

## 2022-01-03 MED ORDER — CHLORHEXIDINE GLUCONATE CLOTH 2 % EX PADS
6.0000 | MEDICATED_PAD | Freq: Every day | CUTANEOUS | Status: DC
  Administered 2022-01-03 – 2022-01-19 (×18): 6 via TOPICAL

## 2022-01-03 MED ORDER — FENTANYL CITRATE (PF) 100 MCG/2ML IJ SOLN
25.0000 ug | INTRAMUSCULAR | Status: DC | PRN
  Administered 2022-01-07: 50 ug via INTRAVENOUS
  Administered 2022-01-08 (×2): 100 ug via INTRAVENOUS
  Administered 2022-01-08: 20:00:00 50 ug via INTRAVENOUS
  Administered 2022-01-09: 03:00:00 100 ug via INTRAVENOUS
  Administered 2022-01-12: 25 ug via INTRAVENOUS
  Filled 2022-01-03 (×6): qty 2

## 2022-01-03 MED ORDER — METRONIDAZOLE 500 MG/100ML IV SOLN
500.0000 mg | Freq: Once | INTRAVENOUS | Status: AC
  Administered 2022-01-03: 500 mg via INTRAVENOUS
  Filled 2022-01-03: qty 100

## 2022-01-03 MED ORDER — LACTATED RINGERS IV BOLUS
1000.0000 mL | Freq: Once | INTRAVENOUS | Status: AC
  Administered 2022-01-03: 1000 mL via INTRAVENOUS

## 2022-01-03 MED ORDER — FENTANYL CITRATE PF 50 MCG/ML IJ SOSY: 25.0000 ug | PREFILLED_SYRINGE | INTRAMUSCULAR | Status: DC | PRN

## 2022-01-03 MED ORDER — VANCOMYCIN HCL 750 MG/150ML IV SOLN
750.0000 mg | Freq: Once | INTRAVENOUS | Status: AC
  Administered 2022-01-03: 750 mg via INTRAVENOUS
  Filled 2022-01-03: qty 150

## 2022-01-03 MED ORDER — MIRTAZAPINE 15 MG PO TABS: 15.0000 mg | ORAL_TABLET | Freq: Every day | ORAL | 3 refills | Status: DC

## 2022-01-03 MED ORDER — VANCOMYCIN VARIABLE DOSE PER UNSTABLE RENAL FUNCTION (PHARMACIST DOSING): Status: DC

## 2022-01-03 MED ORDER — DOCUSATE SODIUM 50 MG/5ML PO LIQD
100.0000 mg | Freq: Two times a day (BID) | ORAL | Status: DC
  Administered 2022-01-04 – 2022-01-19 (×20): 100 mg
  Filled 2022-01-03 (×21): qty 10

## 2022-01-03 MED ORDER — SODIUM CHLORIDE 0.9 % IV SOLN
2.0000 g | Freq: Once | INTRAVENOUS | Status: AC
  Administered 2022-01-03: 2 g via INTRAVENOUS
  Filled 2022-01-03: qty 2

## 2022-01-03 MED ORDER — POLYETHYLENE GLYCOL 3350 17 G PO PACK
17.0000 g | PACK | Freq: Every day | ORAL | Status: DC
  Administered 2022-01-04 – 2022-01-19 (×12): 17 g
  Filled 2022-01-03 (×12): qty 1

## 2022-01-03 MED ORDER — CALCIUM GLUCONATE-NACL 1-0.675 GM/50ML-% IV SOLN
1.0000 g | Freq: Once | INTRAVENOUS | Status: AC
  Administered 2022-01-04: 1000 mg via INTRAVENOUS
  Filled 2022-01-03: qty 50

## 2022-01-03 MED ORDER — MIDAZOLAM HCL 2 MG/2ML IJ SOLN
1.0000 mg | INTRAMUSCULAR | Status: DC | PRN
  Filled 2022-01-03: qty 2

## 2022-01-03 MED ORDER — VANCOMYCIN HCL IN DEXTROSE 1-5 GM/200ML-% IV SOLN
1000.0000 mg | Freq: Once | INTRAVENOUS | Status: DC
  Filled 2022-01-03: qty 200

## 2022-01-03 MED ORDER — PROPOFOL 1000 MG/100ML IV EMUL: 0.0000 ug/kg/min | INTRAVENOUS | Status: DC

## 2022-01-03 MED ORDER — ACETAMINOPHEN 650 MG RE SUPP: 650.0000 mg | RECTAL | Status: DC | PRN

## 2022-01-03 MED ORDER — INSULIN ASPART 100 UNIT/ML IJ SOLN
0.0000 [IU] | INTRAMUSCULAR | Status: DC
  Administered 2022-01-04: 8 [IU] via SUBCUTANEOUS
  Administered 2022-01-04: 3 [IU] via SUBCUTANEOUS
  Administered 2022-01-04: 8 [IU] via SUBCUTANEOUS
  Administered 2022-01-05: 2 [IU] via SUBCUTANEOUS
  Administered 2022-01-05 (×3): 3 [IU] via SUBCUTANEOUS
  Administered 2022-01-05 (×2): 2 [IU] via SUBCUTANEOUS
  Administered 2022-01-06: 3 [IU] via SUBCUTANEOUS
  Administered 2022-01-06: 2 [IU] via SUBCUTANEOUS
  Administered 2022-01-06 (×4): 3 [IU] via SUBCUTANEOUS
  Administered 2022-01-07 (×2): 2 [IU] via SUBCUTANEOUS
  Administered 2022-01-07 (×4): 3 [IU] via SUBCUTANEOUS
  Administered 2022-01-08: 12:00:00 2 [IU] via SUBCUTANEOUS
  Administered 2022-01-08 (×5): 3 [IU] via SUBCUTANEOUS
  Administered 2022-01-09: 05:00:00 2 [IU] via SUBCUTANEOUS
  Administered 2022-01-09: 20:00:00 3 [IU] via SUBCUTANEOUS
  Administered 2022-01-09: 11:00:00 5 [IU] via SUBCUTANEOUS
  Administered 2022-01-09 (×2): 3 [IU] via SUBCUTANEOUS
  Administered 2022-01-09: 15:00:00 2 [IU] via SUBCUTANEOUS
  Administered 2022-01-10 (×3): 3 [IU] via SUBCUTANEOUS
  Administered 2022-01-10: 5 [IU] via SUBCUTANEOUS
  Administered 2022-01-10: 3 [IU] via SUBCUTANEOUS
  Administered 2022-01-10 (×2): 2 [IU] via SUBCUTANEOUS
  Administered 2022-01-11 (×4): 3 [IU] via SUBCUTANEOUS
  Administered 2022-01-11: 2 [IU] via SUBCUTANEOUS
  Administered 2022-01-12: 5 [IU] via SUBCUTANEOUS
  Administered 2022-01-12 (×4): 3 [IU] via SUBCUTANEOUS
  Administered 2022-01-12 – 2022-01-13 (×4): 2 [IU] via SUBCUTANEOUS
  Administered 2022-01-13: 3 [IU] via SUBCUTANEOUS
  Administered 2022-01-13: 2 [IU] via SUBCUTANEOUS
  Administered 2022-01-13 – 2022-01-14 (×4): 3 [IU] via SUBCUTANEOUS
  Administered 2022-01-14 (×2): 2 [IU] via SUBCUTANEOUS
  Administered 2022-01-14 – 2022-01-15 (×2): 3 [IU] via SUBCUTANEOUS
  Administered 2022-01-15: 2 [IU] via SUBCUTANEOUS
  Administered 2022-01-15 (×2): 3 [IU] via SUBCUTANEOUS
  Administered 2022-01-15 (×2): 2 [IU] via SUBCUTANEOUS
  Administered 2022-01-16 (×2): 3 [IU] via SUBCUTANEOUS
  Administered 2022-01-16 (×2): 2 [IU] via SUBCUTANEOUS
  Administered 2022-01-16 (×2): 3 [IU] via SUBCUTANEOUS
  Administered 2022-01-17 (×2): 2 [IU] via SUBCUTANEOUS

## 2022-01-03 MED ORDER — PROPOFOL 1000 MG/100ML IV EMUL
INTRAVENOUS | Status: AC
  Administered 2022-01-03: 10 ug/kg/min via INTRAVENOUS
  Filled 2022-01-03: qty 100

## 2022-01-03 MED ORDER — PANTOPRAZOLE SODIUM 40 MG IV SOLR
40.0000 mg | Freq: Every day | INTRAVENOUS | Status: DC
  Administered 2022-01-04 – 2022-01-10 (×8): 40 mg via INTRAVENOUS
  Filled 2022-01-03 (×8): qty 40

## 2022-01-03 MED ORDER — SODIUM CHLORIDE 0.9 % IV SOLN: 250.0000 mL | INTRAVENOUS | Status: DC

## 2022-01-03 MED ORDER — MIRTAZAPINE 7.5 MG PO TABS: 7.5000 mg | ORAL_TABLET | Freq: Every day | ORAL | 3 refills | Status: AC

## 2022-01-03 NOTE — Progress Notes (Signed)
Pharmacy Antibiotic Note  Joan Hunter is a 88 y.o. female admitted on 12/29/2021 with sepsis.  Pharmacy has been consulted for vancomycin and cefepime dosing.  WBC is within normal limits, patient with temp at 95.64F and below normal, renal function appears to be elevated from baseline with Scr at 2.49. Given   Plan: Cefepime 2g IV q24h Vancomycin 750 mg IV x1 Vancomycin dosed intermittently Metronidazole x1 per MD Monitor renal function and signs of clinical improvement Follow-up cultures    Temp (24hrs), Avg:95.5 F (35.3 C), Min:95.5 F (35.3 C), Max:95.5 F (35.3 C)  Recent Labs  Lab 01/14/2022 1918  WBC 9.7    CrCl cannot be calculated (Patient's most recent lab result is older than the maximum 21 days allowed.).    Allergies  Allergen Reactions   Moxifloxacin     REACTION: hives   Penicillins     REACTION: hives   Prednisone     REACTION: hives    Antimicrobials this admission: 1/19 vancomycin >>  1/19 cefepime >>  1/19 metronidazole x 1  Dose adjustments this admission: none  Microbiology results: 1/19 BCx: pending 1/19 UCx: pending   Thank you for involving pharmacy in this patient's care.  Elita Quick, PharmD PGY1 Ambulatory Care Pharmacy Resident 12/31/2021 8:12 PM  **Pharmacist phone directory can be found on [email or web address] listed under [place]**

## 2022-01-03 NOTE — ED Provider Notes (Signed)
[facility] EMERGENCY DEPARTMENT Provider Note   CSN: [id number] Arrival date & time: 12/29/2021  1914     History  Chief Complaint  Patient presents with   Cardiac Arrest    Joan Hunter is a 88 y.o. female.  HPI     88 year old female with history of CHF, valvular disorder, CAD, hypertension, previous history of lung cancer comes in after cardiac arrest.  Level 5 caveat for unresponsive patient.  Per EMS, patient was with her family when she suddenly arrested.  She had received 2 rounds of epinephrine and about 15 minutes of CPR prior to ED arrival.  CBG was over 100.  Spoke with patient's daughter, who has medical POA.  She indicates that patient has had reduced p.o. intake over the last few days.  She has been having increased weakness.  Today she went to her neurologist and her primary care doctor.  Patient's blood pressure was noted to be low.  Once they arrived home, patient was having difficulty getting inside the home and complained to the family that she was too tired.  Family noted increasing shortness of breath, and finally patient arrested.   Home Medications Prior to Admission medications   Medication Sig Start Date End Date Taking? Authorizing Provider  atorvastatin (LIPITOR) 10 MG tablet TAKE 1 TABLET BY MOUTH EVERY OTHER DAY Patient taking differently: Take 10 mg by mouth every Monday, Wednesday, and Friday. 11/21/21  Yes Jerline Pain, MD  donepezil (ARICEPT) 10 MG tablet TAKE 1 TABLET BY MOUTH EVERY DAY Patient taking differently: Take 10 mg by mouth in the morning. 12/26/2021  Yes Rondel Jumbo, PA-C  mirtazapine (REMERON) 15 MG tablet Take 15 mg by mouth at bedtime.   Yes [provider]  pantoprazole (PROTONIX) 40 MG tablet Take 40 mg by mouth daily before breakfast. 08/01/15  Yes [provider]  potassium chloride 20 MEQ/15ML (10%) SOLN Take 15 mLs (20 mEq total) by mouth daily. Patient taking differently: Take  26.67 mEq by mouth daily as needed (for low potassium (mix into juice)). 11/14/20  Yes Jerline Pain, MD  PROAIR HFA [phone] Base) MCG/ACT inhaler Inhale 2 puffs into the lungs every 6 (six) hours as needed for wheezing or shortness of breath. 02/27/17  Yes [provider]  amLODipine (NORVASC) 5 MG tablet TAKE 1 TABLET BY MOUTH EVERYDAY AT BEDTIME Patient not taking: Reported on 12/27/2021 05/01/21   Jerline Pain, MD  aspirin 81 MG tablet Take 81 mg by mouth daily. Patient not taking: Reported on 01/10/2022    [provider]  divalproex (DEPAKOTE ER) 250 MG 24 hr tablet Take 1 tablet every night Patient not taking: Reported on 01/10/2022 01/10/2022   Rondel Jumbo, PA-C  furosemide (LASIX) 40 MG tablet TAKE 1 TABLET BY MOUTH EVERY OTHER DAY Patient not taking: Reported on 01/05/2022 11/01/21   Jerline Pain, MD  metoprolol succinate (TOPROL-XL) 100 MG 24 hr tablet TAKE 1 TABLET BY MOUTH DAILY. TAKE WITH OR IMMEDIATELY FOLLOWING A MEAL. Patient not taking: Reported on 12/27/2021 12/12/20   Jerline Pain, MD  mirtazapine (REMERON) 7.5 MG tablet Take 1 tablet (7.5 mg total) by mouth at bedtime. Patient not taking: Reported on 12/22/2021 12/19/2021   Rondel Jumbo, PA-C      Allergies    Moxifloxacin, Penicillins, and Prednisone    Review of Systems   Review of Systems  Unable to perform ROS: Patient unresponsive   Physical Exam Updated  Vital Signs BP (!) 122/99    Pulse 89    Temp (!) 96.5 F (35.8 C) (Temporal)    Resp 11    Ht _0  (1.626 m)    Wt 50 kg    SpO2 100%    BMI 18.92 kg/m  Physical Exam Vitals and nursing note reviewed.  Constitutional:      Appearance: She is well-developed.     Comments: Unresponsive  Eyes:     Comments: 5 mm - equal, nonreactive  Cardiovascular:     Rate and Rhythm: Normal rate.  Pulmonary:     Comments: On ventilator Abdominal:     Palpations: Abdomen is soft.  Musculoskeletal:     Cervical back: Normal range of motion and neck  supple.  Skin:    General: Skin is warm.  Neurological:     Comments: GCS 3    ED Results / Procedures / Treatments   Labs (all labs ordered are listed, but only abnormal results are displayed) Labs Reviewed  LACTIC ACID, PLASMA - Abnormal; Notable for the following components:      Result Value   Lactic Acid, Venous >9.0 (*)    All other components within normal limits  LACTIC ACID, PLASMA - Abnormal; Notable for the following components:   Lactic Acid, Venous >9.0 (*)    All other components within normal limits  COMPREHENSIVE METABOLIC PANEL - Abnormal; Notable for the following components:   Potassium 3.3 (*)    CO2 11 (*)    Glucose, Bld 359 (*)    BUN 44 (*)    Creatinine, Ser 2.49 (*)    Calcium 8.6 (*)    Total Protein 6.1 (*)    Albumin 2.4 (*)    AST 239 (*)    ALT 179 (*)    GFR, Estimated 18 (*)    Anion gap 27 (*)    All other components within normal limits  CBC WITH DIFFERENTIAL/PLATELET - Abnormal; Notable for the following components:   RBC 3.80 (*)    Hemoglobin 11.3 (*)    MCV 102.9 (*)    MCHC 28.9 (*)    Abs Immature Granulocytes 0.68 (*)    All other components within normal limits  GLUCOSE, CAPILLARY - Abnormal; Notable for the following components:   Glucose-Capillary 259 (*)    All other components within normal limits  I-STAT ARTERIAL BLOOD GAS, ED - Abnormal; Notable for the following components:   pH, Arterial 7.128 (*)    pO2, Arterial 278 (*)    Bicarbonate 11.7 (*)    TCO2 13 (*)    Acid-base deficit 16.0 (*)    Calcium, Ion 1.10 (*)    HCT 26.0 (*)    Hemoglobin 8.8 (*)    All other components within normal limits  I-STAT VENOUS BLOOD GAS, ED - Abnormal; Notable for the following components:   pH, Ven 7.059 (*)    pCO2, Ven 43.9 (*)    pO2, Ven 93.0 (*)    Bicarbonate 12.4 (*)    TCO2 14 (*)    Acid-base deficit 17.0 (*)    Potassium 3.2 (*)    Calcium, Ion 1.01 (*)    All other components within normal limits  I-STAT  ARTERIAL BLOOD GAS, ED - Abnormal; Notable for the following components:   pCO2 arterial 31.6 (*)    pO2, Arterial 142 (*)    Acid-base deficit 3.0 (*)    Potassium 3.2 (*)    Calcium, Ion 1.09 (*)  HCT 34.0 (*)    Hemoglobin 11.6 (*)    All other components within normal limits  TROPONIN I (HIGH SENSITIVITY) - Abnormal; Notable for the following components:   Troponin I (High Sensitivity) 47 (*)    All other components within normal limits  TROPONIN I (HIGH SENSITIVITY) - Abnormal; Notable for the following components:   Troponin I (High Sensitivity) 105 (*)    All other components within normal limits  RESP PANEL BY RT-PCR (FLU A&B, COVID) ARPGX2  CULTURE, BLOOD (ROUTINE X 2)  CULTURE, BLOOD (ROUTINE X 2)  URINE CULTURE  MRSA NEXT GEN BY PCR, NASAL  URINALYSIS, ROUTINE W REFLEX MICROSCOPIC  TRIGLYCERIDES  PROTIME-INR  APTT  BLOOD GAS, ARTERIAL  CBC  BASIC METABOLIC PANEL  MAGNESIUM  PHOSPHORUS  HEPATIC FUNCTION PANEL  LACTIC ACID, PLASMA  LACTIC ACID, PLASMA  HEMOGLOBIN A1C  AMYLASE  LIPASE, BLOOD    EKG EKG Interpretation  Date/Time:  Thursday January 03 2022 19:25:38 EST Ventricular Rate:  93 PR Interval:    QRS Duration: 151 QT Interval:  423 QTC Calculation: 527 R Axis:   -63 Text Interpretation: Atrial fibrillation RBBB and LAFB Left ventricular hypertrophy Lateral leads are also involved Nonspecific ST abnormality - inferior and lateral leads Confirmed by Varney Biles [phone]) on 12/20/2021 7:48:33 PM    EKG Interpretation  Date/Time:  Thursday January 03 2022 19:48:57 EST Ventricular Rate:  86 PR Interval:    QRS Duration: 152 QT Interval:  485 QTC Calculation: 581 R Axis:   -56 Text Interpretation: Atrial fibrillation RBBB and LAFB Left ventricular hypertrophy Anterior Q waves, possibly due to LVH No significant change since last tracing Confirmed by Varney Biles [phone]) on 01/12/2022 7:54:11 PM        Radiology DG Abdomen 1  View  Result Date: 01/04/2022 CLINICAL DATA:  Status post orogastric tube placement. EXAM: ABDOMEN - 1 VIEW COMPARISON:  None. FINDINGS: Numerous overlying radiopaque cardiac lead wires are seen. A nasogastric tube is noted with its distal tip overlying the lateral aspect of the left upper quadrant. The distal side hole sits approximately 7.9 cm distal to the expected region of the gastroesophageal junction. The bowel gas pattern is normal. No radio-opaque calculi or other significant radiographic abnormality are seen. IMPRESSION: Nasogastric tube positioning, as described above. Electronically Signed   By: Virgina Norfolk M.D.   On: 01/10/2022 20:57   CT Head Wo Contrast  Result Date: 12/28/2021 CLINICAL DATA:  Mental status change, post arrest.  Neck trauma. EXAM: CT HEAD WITHOUT CONTRAST CT CERVICAL SPINE WITHOUT CONTRAST TECHNIQUE: Multidetector CT imaging of the head and cervical spine was performed following the standard protocol without intravenous contrast. Multiplanar CT image reconstructions of the cervical spine were also generated. RADIATION DOSE REDUCTION: This exam was performed according to the departmental dose-optimization program which includes automated exposure control, adjustment of the mA and/or kV according to patient size and/or use of iterative reconstruction technique. COMPARISON:  04/22/2014. FINDINGS: CT HEAD FINDINGS Brain: No acute intracranial hemorrhage, midline shift or mass effect. No extra-axial fluid collection. Subcortical and periventricular white matter hypodensities are seen bilaterally. No hydrocephalus. Mild atrophy is noted. Senescent calcifications are present in the basal ganglia. Vascular: Atherosclerotic calcification of the vertebral arteries and carotid siphons. No hyperdense vessel. Skull: Normal. Negative for fracture or focal lesion. Sinuses/Orbits: No acute finding. Other: Tubes are present in the oral cavity. CT CERVICAL SPINE FINDINGS Alignment: There is  mild anterolisthesis at C3-C4 and C4-C5. Skull base and vertebrae: No acute  fracture. No primary bone lesion or focal pathologic process. Soft tissues and spinal canal: No prevertebral fluid or swelling. No visible canal hematoma. Disc levels: Intervertebral disc space narrowing, osteophyte formation, and facet arthropathy is present at multiple levels resulting in no significant spinal canal stenosis. Moderate to severe neural foraminal stenosis bilaterally. Upper chest: Apical pleural scarring is noted on the left. Other: Tubes are presen in the oral cavity. IMPRESSION: 1. No acute intracranial process. 2. Atrophy with chronic microvascular ischemic changes. 3. Degenerative changes in the cervical spine with no evidence of acute fracture. Electronically Signed   By: Brett Fairy M.D.   On: 12/19/2021 22:58   CT Cervical Spine Wo Contrast  Result Date: 01/02/2022 CLINICAL DATA:  Mental status change, post arrest.  Neck trauma. EXAM: CT HEAD WITHOUT CONTRAST CT CERVICAL SPINE WITHOUT CONTRAST TECHNIQUE: Multidetector CT imaging of the head and cervical spine was performed following the standard protocol without intravenous contrast. Multiplanar CT image reconstructions of the cervical spine were also generated. RADIATION DOSE REDUCTION: This exam was performed according to the departmental dose-optimization program which includes automated exposure control, adjustment of the mA and/or kV according to patient size and/or use of iterative reconstruction technique. COMPARISON:  04/22/2014. FINDINGS: CT HEAD FINDINGS Brain: No acute intracranial hemorrhage, midline shift or mass effect. No extra-axial fluid collection. Subcortical and periventricular white matter hypodensities are seen bilaterally. No hydrocephalus. Mild atrophy is noted. Senescent calcifications are present in the basal ganglia. Vascular: Atherosclerotic calcification of the vertebral arteries and carotid siphons. No hyperdense vessel. Skull:  Normal. Negative for fracture or focal lesion. Sinuses/Orbits: No acute finding. Other: Tubes are present in the oral cavity. CT CERVICAL SPINE FINDINGS Alignment: There is mild anterolisthesis at C3-C4 and C4-C5. Skull base and vertebrae: No acute fracture. No primary bone lesion or focal pathologic process. Soft tissues and spinal canal: No prevertebral fluid or swelling. No visible canal hematoma. Disc levels: Intervertebral disc space narrowing, osteophyte formation, and facet arthropathy is present at multiple levels resulting in no significant spinal canal stenosis. Moderate to severe neural foraminal stenosis bilaterally. Upper chest: Apical pleural scarring is noted on the left. Other: Tubes are presen in the oral cavity. IMPRESSION: 1. No acute intracranial process. 2. Atrophy with chronic microvascular ischemic changes. 3. Degenerative changes in the cervical spine with no evidence of acute fracture. Electronically Signed   By: Brett Fairy M.D.   On: 12/30/2021 22:58   DG Chest Port 1 View  Result Date: 01/13/2022 CLINICAL DATA:  Cardiac arrest. EXAM: PORTABLE CHEST 1 VIEW COMPARISON:  May 01, 2015 FINDINGS: Multiple overlying radiopaque cardiac lead wires and tags are seen. An endotracheal tube is seen with its distal tip approximately 3.5 cm from the carina. A nasogastric tube is noted with its distal end extending into the body of the stomach. Moderate to marked severity linear atelectasis and/or scarring is seen within the right upper lobe. This is increased in severity when compared to the prior study. There is no evidence of a pleural effusion or pneumothorax. The heart size and mediastinal contours are within normal limits. No acute osseous abnormality is identified. IMPRESSION: 1. Endotracheal tube and nasogastric tube in good position. 2. Moderate to marked severity right upper lobe linear atelectasis and/or scarring, increased in severity when compared to the prior study. Electronically  Signed   By: Virgina Norfolk M.D.   On: 01/15/2022 19:58    Procedures .Critical Care Performed by: Varney Biles, MD Authorized by: Varney Biles, MD   Critical  care provider statement:    Critical care time (minutes):  49   Critical care was necessary to treat or prevent imminent or life-threatening deterioration of the following conditions:  Circulatory failure and respiratory failure   Critical care was time spent personally by me on the following activities:  Development of treatment plan with patient or surrogate, discussions with consultants, evaluation of patient's response to treatment, examination of patient, ordering and review of laboratory studies, ordering and review of radiographic studies, ordering and performing treatments and interventions, pulse oximetry, re-evaluation of patient's condition, review of old charts and obtaining history from patient or surrogate Procedure Name: Intubation Date/Time: 12/27/2021 8:01 PM Performed by: Varney Biles, MD Pre-anesthesia Checklist: Patient identified, Patient being monitored, Emergency Drugs available, Timeout performed and Suction available Oxygen Delivery Method: Ambu bag Preoxygenation: Pre-oxygenation with 100% oxygen Ventilation: Mask ventilation without difficulty Laryngoscope Size: Mac and 3 Grade View: Grade I Tube size: 7.5 mm Number of attempts: 1 Placement Confirmation: ETT inserted through vocal cords under direct vision, CO2 detector and Breath sounds checked- equal and bilateral Secured at: 22 cm    OG placement  Date/Time: 01/09/2022 8:03 PM Performed by: Varney Biles, MD Authorized by: Varney Biles, MD  Consent: The procedure was performed in an emergent situation. Patient identity confirmed: arm band Time out: Immediately prior to procedure a "time out" was called to verify the correct patient, procedure, equipment, support staff and site/side marked as required. Preparation: Patient was  prepped and draped in the usual sterile fashion. Local anesthesia used: no  Anesthesia: Local anesthesia used: no  Sedation: Patient sedated: no  Patient tolerance: patient tolerated the procedure well with no immediate complications      Medications Ordered in ED Medications  midazolam (VERSED) injection 1 mg (has no administration in time range)  midazolam (VERSED) injection 1 mg (has no administration in time range)  lactated ringers infusion ( Intravenous New Bag/Given 01/12/2022 2214)  norepinephrine (LEVOPHED) 21m in 256m(0.016 mg/mL) premix infusion (9 mcg/min Intravenous Rate/Dose Change 12/24/2021 2300)  propofol (DIPRIVAN) 1000 MG/100ML infusion (10 mcg/kg/min  36.7 kg Intravenous Rate/Dose Change 12/19/2021 2242)  vancomycin variable dose per unstable renal function (pharmacist dosing) (has no administration in time range)  ceFEPIme (MAXIPIME) 2 g in sodium chloride 0.9 % 100 mL IVPB (has no administration in time range)  docusate (COLACE) 50 MG/5ML liquid 100 mg (has no administration in time range)  polyethylene glycol (MIRALAX / GLYCOLAX) packet 17 g (has no administration in time range)  ondansetron (ZOFRAN) injection 4 mg (has no administration in time range)  acetaminophen (TYLENOL) tablet 650 mg (has no administration in time range)    Or  acetaminophen (TYLENOL) 160 MG/5ML solution 650 mg (has no administration in time range)    Or  acetaminophen (TYLENOL) suppository 650 mg (has no administration in time range)  0.9 %  sodium chloride infusion (has no administration in time range)  pantoprazole (PROTONIX) injection 40 mg (has no administration in time range)  heparin injection 5,000 Units (has no administration in time range)  potassium chloride (KLOR-CON) packet 40 mEq (has no administration in time range)  calcium gluconate 1 g/ 50 mL sodium chloride IVPB (has no administration in time range)  insulin aspart (novoLOG) injection 0-15 Units (has no administration in  time range)  Chlorhexidine Gluconate Cloth 2 % PADS 6 each (6 each Topical Given 12/26/2021 2352)  fentaNYL (SUBLIMAZE) injection 25 mcg (has no administration in time range)  fentaNYL (SUBLIMAZE) injection 25-100 mcg (has no  administration in time range)  sodium chloride 0.9 % bolus 1,000 mL (0 mLs Intravenous Stopped 12/30/2021 2016)  ceFEPIme (MAXIPIME) 2 g in sodium chloride 0.9 % 100 mL IVPB (0 g Intravenous Stopped 01/09/2022 2052)  metroNIDAZOLE (FLAGYL) IVPB 500 mg (0 mg Intravenous Stopped 01/07/2022 2121)  lactated ringers bolus 1,000 mL (0 mLs Intravenous Stopped 12/31/2021 1951)  vancomycin (VANCOREADY) IVPB 750 mg/150 mL (0 mg Intravenous Stopped 01/07/2022 2242)  lactated ringers bolus 500 mL (0 mLs Intravenous Stopped 01/02/2022 2121)    ED Course/ Medical Decision Making/ A&P Clinical Course as of 01/04/22 0002  Fri Jan 04, 2022  0001 Initial lab work-up reviewed.  Patient's white count is normal.  She has significant metabolic acidosis with lactic acid over 9.  Patient's initial high-sensitivity troponin is also slightly elevated, likely demand ischemia.  She has a BUN of 44 and creatinine of 2.49.  LFTs are also mildly elevated.  All of those abnormalities likely are due to prerenal cause.  Anion gap is significantly elevated as well.  Critical care team has been consulted.  Patient is hemodynamically stable on peripheral pressors at this time.  IV fluids have been ordered. Broad-spectrum antibiotics also provided.  Overall, patient is stabilized secondary to those interventions. [AN]  0002 CT Head Wo Contrast CT head negative for any acute findings.  CT cervical spine also normal.  We will remove the collar, as it does not appear that there was any significant trauma at any point. [AN]    Clinical Course User Index [AN] Varney Biles, MD                           Medical Decision Making Amount and/or Complexity of Data Reviewed Independent Historian: guardian and EMS External Data  Reviewed: radiology and notes. Labs: ordered. Decision-making details documented in ED Course. Radiology: ordered and independent interpretation performed. Decision-making details documented in ED Course. ECG/medicine tests: ordered and independent interpretation performed. Discussion of management or test interpretation with external provider(s): ICU doctors -discussed hemodynamic management.  Risk Prescription drug management. Drug therapy requiring intensive monitoring for toxicity. Decision regarding hospitalization.   This patient presents to the ED with chief complaint(s) unresponsive patient in cardiac with pertinent past medical history of CAD, CHF which further complicates the presenting complaint. The complaint involves an extensive differential diagnosis and treatment options and also carries with it a high risk of complications and morbidity.    The differential diagnosis includes shock -hypovolemia versus sepsis, ACS-MI, electrolyte abnormality, renal failure, stroke, intracranial hemorrhage, pulmonary embolism, CHF exacerbation.  We have return of spontaneous circulation in the ED.  Patient had a King airway in place, we replaced that with ET tube.  EKG in 2 2 ordered.  Patient does appear to have new A. fib, but there is no clear evidence of acute MI.   Patient is noted to be hypotensive.  2 L of IV fluid has been ordered.  We will also start broad-spectrum antibiotics, as after talking to the patient's daughter, it appears that she has been slowly declining over the past few days, and infection certainly can do that  The initial plan is to : Stabilize the patient.  In addition to the fluid, she will require norepinephrine as her blood pressure remains low.  We will order CT scan of her brain and C-spine to ensure that there is no evidence of brain bleed or significant cerebral edema or C-spine injury.   Additional history obtained:  Additional history obtained from  family Records reviewed Primary Care Documents and cardiology and oncology visit.  Reassessment and review: Lab Tests: I Ordered, and personally interpreted labs.  The pertinent results include: I-STAT blood gas which revealed metabolic acidosis.  Imaging Studies ordered: I independently visualized and interpreted the following imaging X-ray of the chest   which showed ET tube that is appropriately placed. I agree with the radiologist interpretation  Critical Interventions:  Intubation, initiation of pressors.  Cardiac Monitoring: The patient was maintained on a cardiac monitor.  I personally viewed and interpreted the cardiac monitor which showed an underlying rhythm of:  Atrial Fibrillation    Reevaluation of the patient after these medicines showed that the patient has stabilized hemodynamically with norepinephrine.  Consultations Obtained: I requested consultation with the admitting physician , critical care team , and discussed  findings as well as pertinent plan - they will accept the patient for admission.  Complexity of problems addressed: Patients presentation is most consistent with  acute presentation with potential threat to life or bodily function During patient's assessment  Disposition: After consideration of the diagnostic results and the patients response to treatment,  I feel that the patent would benefit from admission to the critical care unit .         Final Clinical Impression(s) / ED Diagnoses Final diagnoses:  Cardiac arrest [facility])  Metabolic acidosis    Rx / DC Orders ED Discharge Orders     None         Varney Biles, MD 01/04/22 0004

## 2022-01-03 NOTE — ED Notes (Signed)
Bair hugger applied.

## 2022-01-03 NOTE — Patient Instructions (Signed)
Good to see you! Continue all your medications. Follow-up in 6 months, call for any changes  FALL PRECAUTIONS: Be cautious when walking. Scan the area for obstacles that may increase the risk of trips and falls. When getting up in the mornings, sit up at the edge of the bed for a few minutes before getting out of bed. Consider elevating the bed at the head end to avoid drop of blood pressure when getting up. Walk always in a well-lit room (use night lights in the walls). Avoid area rugs or power cords from appliances in the middle of the walkways. Use a walker or a cane if necessary and consider physical therapy for balance exercise. Get your eyesight checked regularly.  HOME SAFETY: Consider the safety of the kitchen when operating appliances like stoves, microwave oven, and blender. Consider having supervision and share cooking responsibilities until no longer able to participate in those. Accidents with firearms and other hazards in the house should be identified and addressed as well.  ABILITY TO BE LEFT ALONE: If patient is unable to contact 911 operator, consider using LifeLine, or when the need is there, arrange for someone to stay with patients. Smoking is a fire hazard, consider supervision or cessation. Risk of wandering should be assessed by caregiver and if detected at any point, supervision and safe proof recommendations should be instituted.  RECOMMENDATIONS FOR ALL PATIENTS WITH MEMORY PROBLEMS: 1. Continue to exercise (Recommend 30 minutes of walking everyday, or 3 hours every week) 2. Increase social interactions - continue going to [place] and enjoy social gatherings with friends and family 3. Eat healthy, avoid fried foods and eat more fruits and vegetables 4. Maintain adequate blood pressure, blood sugar, and blood cholesterol level. Reducing the risk of stroke and cardiovascular disease also helps promoting better memory. 5. Avoid stressful situations. Live a simple life and avoid  aggravations. Organize your time and prepare for the next day in anticipation. 6. Sleep well, avoid any interruptions of sleep and avoid any distractions in the bedroom that may interfere with adequate sleep quality 7. Avoid sugar, avoid sweets as there is a strong link between excessive sugar intake, diabetes, and cognitive impairment The Mediterranean diet has been shown to help patients reduce the risk of progressive memory disorders and reduces cardiovascular risk. This includes eating fish, eat fruits and green leafy vegetables, nuts like almonds and hazelnuts, walnuts, and also use olive oil. Avoid fast foods and fried foods as much as possible. Avoid sweets and sugar as sugar use has been linked to worsening of memory function.  There is always a concern of gradual progression of memory problems. If this is the case, then we may need to adjust level of care according to patient needs. Support, both to the patient and caregiver, should then be put into place.

## 2022-01-03 NOTE — ED Notes (Signed)
Per critical care, bair hugger not needed.

## 2022-01-03 NOTE — Progress Notes (Signed)
Patient BIB EMS, on report witness cardiac arrest at home, CPR 15 minutes prior to arrival to ED. ROSC was achieved after 2 EPI. On arrival to ED [facility] Airway is in place. Patient is unresponsive accompanied w/ agonal respirations and profoundly hypotensive. Patient was intubated per ED physician w/o complications.   Aisling Emigh L. Tamala Julian, BS, RRT-ACCS, RCP

## 2022-01-03 NOTE — Progress Notes (Signed)
An USGPIV (ultrasound guided PIV) has been placed for short-term vasopressor infusion. A correctly placed ivWatch must be used when administering Vasopressors. Should this treatment be needed beyond 72 hours, central line access should be obtained.  It will be the responsibility of the bedside nurse to follow best practice to prevent extravasations.   ?

## 2022-01-03 NOTE — Sepsis Progress Note (Signed)
Monitoring for the code sepsis protocol. °

## 2022-01-03 NOTE — Code Documentation (Signed)
Intuabted, 20 at the lip.

## 2022-01-03 NOTE — ED Notes (Signed)
MD notified of patients rectal temp. No bair hugger available - attempting to obtain one. Warm blankets on patient until bair huger is available.

## 2022-01-03 NOTE — H&P (Signed)
NAME:  Joan Hunter, MRN:  [id number], DOB:  Apr 27, 1927, LOS: 0 ADMISSION DATE:  01/02/2022, CONSULTATION DATE: 1/19 REFERRING MD: Dr. Kathrynn Humble, CHIEF COMPLAINT: Cardiac arrest  History of Present Illness:  88 year old female with past medical history as below, which is significant for dementia, lung cancer status post curative radiotherapy, coronary artery disease, dilated cardiomyopathy with previous ejection fraction 15% now resolved, hypertension, and diabetes mellitus.  The patient is unable to provide history so much is taken from her daughter who is at bedside at the time of evaluation.  At baseline the patient dementia sounds to be quite advanced.  She is only able to minimally assist with activities of daily living and her oral intake is fallen off quite a bit.  She was sleeping much more than normal in the days leading up to her presentation on 1/19.  Earlier that day she presented to the neurology office for a scheduled office visit regarding dementia.  Her blood pressure was noted to be quite low at that visit with systolic in the 35H.  She was referred to her PCPs office where she did go, however, it is unclear what was done.  Later that evening after returning home she became unresponsive.  Family called 37 and during the telephone conversation family noticed the patient stopped breathing and lost pulses.  First responders arrived several minutes later and began CPR for approximately 15 minutes prior to ROSC.  Initial rhythm was PEA.  Upon arrival to the emergency department the patient was endotracheally intubated.  She was started on norepinephrine infusion as well as bolused with crystalloid 2.5 L.  Laboratory evaluation significant for potassium 3.3, CO2 11, glucose 359, creatinine 2. 5, lactic acid greater than 9.  PCCM asked to admit.  Pertinent  Medical History   has a past medical history of Allergic rhinitis, Arthritis, CAD (coronary artery disease), CHF (congestive heart  failure) ([place]), Dementia ([place]), Diabetes mellitus, Dilated cardiomyopathy ([place]), Diverticulosis, Hyperlipidemia, Hypertension, Hypokalemia (01/09/2017), Lung cancer ([place]) (dx'd 06/2012), Lung mass, MR (mitral regurgitation), and Osteopenia.   Significant Hospital Events: Including procedures, antibiotic start and stop dates in addition to other pertinent events     Interim History / Subjective:    Objective   Blood pressure 122/88, pulse 73, temperature (!) 92.9 F (33.8 C), temperature source Rectal, resp. rate 17, height 5\' 4"  (1.626 m), weight 50 kg, SpO2 94 %.    Vent Mode: PRVC FiO2 (%):  [60 %] 60 % Set Rate:  [24 bmp] 24 bmp Vt Set:  [440 mL] 440 mL PEEP:  [5 cmH20] 5 cmH20   Intake/Output Summary (Last 24 hours) at 12/27/2021 2229 Last data filed at 12/22/2021 2052 Gross per 24 hour  Intake 2100 ml  Output --  Net 2100 ml   Filed Weights   01/08/2022 2024  Weight: 50 kg    Examination: General: Frail elderly female on ventilator HENT: Cervical collar in place, normocephalic atraumatic Lungs: Clear bilateral breath sounds Cardiovascular: Regular rate and rhythm no murmur Abdomen: Soft, nondistended Extremities: No acute deformity.  No edema. Neuro: Coma   Resolved Hospital Problem list     Assessment & Plan:   Cardiac arrest: Initial rhythm PEA.  Etiology not clear.  Patient had increased weakness over period of days but had no complaints. -Admit to ICU -CT head -EEG -TTM 37 -Echocardiogram  Shock: Likely cardiogenic status post cardiac arrest, however, she was hypotensive neurology office.  Suspect this is due to either hypovolemic or distributive shock question  sepsis although no clear source. -Norepinephrine for MAP goal 65 > currently overshooting goal, wean down. -Continued IVF resuscitation -Check urinalysis, cultures -Continue empiric antibiotics for now -Echo as above  Acute hypoxemic respiratory failure - Full vent support - ABG reviewed and  settings adjusted.  - VAP bundle - WAU and SBT if appropriate  Acute kidney injury Hypokalemia Hypocalcemia -Replace K, CA -Hydrate -Repeat chemistry  DM - CBG monitoring and SSI  Failure to thrive Goals of care - discussed with patient's daughter in the ED. Full aggressive measures - consider palliative care consultation  Best Practice (right click and "Reselect all SmartList Selections" daily)   Diet/type: NPO DVT prophylaxis: prophylactic heparin  GI prophylaxis: PPI Lines: N/A Foley:  Yes, and it is still needed Code Status:  full code Last date of multidisciplinary goals of care discussion [ ]   Labs   CBC: Recent Labs  Lab 12/21/2021 1918 12/16/2021 1926 01/02/2022 1956  WBC 9.7  --   --   NEUTROABS 5.9  --   --   HGB 11.3* 12.2 8.8*  HCT 39.1 36.0 26.0*  MCV 102.9*  --   --   PLT 152  --   --     Basic Metabolic Panel: Recent Labs  Lab 12/29/2021 1918 12/23/2021 1926 01/02/2022 1956  NA 141 140 142  K 3.3* 3.2* 3.5  CL 103  --   --   CO2 11*  --   --   GLUCOSE 359*  --   --   BUN 44*  --   --   CREATININE 2.49*  --   --   CALCIUM 8.6*  --   --    GFR: Estimated Creatinine Clearance: 11.4 mL/min (A) (by C-G formula based on SCr of 2.49 mg/dL (H)). Recent Labs  Lab 12/21/2021 1918  WBC 9.7  LATICACIDVEN >9.0*    Liver Function Tests: Recent Labs  Lab 12/28/2021 1918  AST 239*  ALT 179*  ALKPHOS 87  BILITOT 0.8  PROT 6.1*  ALBUMIN 2.4*   No results for input(s): LIPASE, AMYLASE in the last 168 hours. No results for input(s): AMMONIA in the last 168 hours.  ABG    Component Value Date/Time   PHART 7.128 (LL) 01/10/2022 1956   PCO2ART 35.3 12/27/2021 1956   PO2ART 278 (H) 01/15/2022 1956   HCO3 11.7 (L) 01/14/2022 1956   TCO2 13 (L) 12/26/2021 1956   ACIDBASEDEF 16.0 (H) 12/17/2021 1956   O2SAT 100.0 01/15/2022 1956     Coagulation Profile: No results for input(s): INR, PROTIME in the last 168 hours.  Cardiac Enzymes: No results for  input(s): CKTOTAL, CKMB, CKMBINDEX, TROPONINI in the last 168 hours.  HbA1C: Hgb A1c MFr Bld  Date/Time Value Ref Range Status  02/09/2015 07:54 AM 6.6 (H) 4.8 - 5.6 % Final    Comment:    (NOTE)         Pre-diabetes: 5.7 - 6.4         Diabetes: >6.4         Glycemic control for adults with diabetes: <7.0   06/15/2012 09:43 AM 6.1 (H) <5.7 % Final    Comment:    (NOTE)  According to the ADA Clinical Practice Recommendations for 2011, when HbA1c is used as a screening test:  >=6.5%   Diagnostic of Diabetes Mellitus           (if abnormal result is confirmed) 5.7-6.4%   Increased risk of developing Diabetes Mellitus References:Diagnosis and Classification of Diabetes Mellitus,Diabetes WNIO,2703,50(KXFGH 1):S62-S69 and Standards of Medical Care in         Diabetes - 2011,Diabetes WEXH,3716,96 (Suppl 1):S11-S61.    CBG: No results for input(s): GLUCAP in the last 168 hours.  Review of Systems:   Patient is encephalopathic and/or intubated. Therefore history has been obtained from chart review.    Past Medical History:  She,  has a past medical history of Allergic rhinitis, Arthritis, CAD (coronary artery disease), CHF (congestive heart failure) ([place]), Dementia ([place]), Diabetes mellitus, Dilated cardiomyopathy ([place]), Diverticulosis, Hyperlipidemia, Hypertension, Hypokalemia (01/09/2017), Lung cancer ([place]) (dx'd 06/2012), Lung mass, MR (mitral regurgitation), and Osteopenia.   Surgical History:   Past Surgical History:  Procedure Laterality Date   ABDOMINAL HYSTERECTOMY     APPENDECTOMY     EYE SURGERY     bil cataracts   KNEE ARTHROSCOPY     ?  left  knee   TONSILLECTOMY       Social History:   reports that she quit smoking about 49 years ago. Her smoking use included cigarettes. She has a 2.50 pack-year smoking history. She has never used smokeless tobacco. She reports that she does not drink alcohol and  does not use drugs.   Family History:  Her family history includes Colon cancer in her mother; Heart disease in her brother.   Allergies Allergies  Allergen Reactions   Moxifloxacin Hives   Penicillins Hives   Prednisone Hives     Home Medications  Prior to Admission medications   Medication Sig Start Date End Date Taking? Authorizing Provider  atorvastatin (LIPITOR) 10 MG tablet TAKE 1 TABLET BY MOUTH EVERY OTHER DAY Patient taking differently: Take 10 mg by mouth every Monday, Wednesday, and Friday. 11/21/21  Yes Jerline Pain, MD  donepezil (ARICEPT) 10 MG tablet TAKE 1 TABLET BY MOUTH EVERY DAY Patient taking differently: Take 10 mg by mouth in the morning. 12/17/2021  Yes Rondel Jumbo, PA-C  mirtazapine (REMERON) 15 MG tablet Take 15 mg by mouth at bedtime.   Yes [provider]  pantoprazole (PROTONIX) 40 MG tablet Take 40 mg by mouth daily before breakfast. 08/01/15  Yes [provider]  potassium chloride 20 MEQ/15ML (10%) SOLN Take 15 mLs (20 mEq total) by mouth daily. Patient taking differently: Take 26.67 mEq by mouth daily as needed (for low potassium (mix into juice)). 11/14/20  Yes Jerline Pain, MD  PROAIR HFA 108 [phone] Base) MCG/ACT inhaler Inhale 2 puffs into the lungs every 6 (six) hours as needed for wheezing or shortness of breath. 02/27/17  Yes [provider]  amLODipine (NORVASC) 5 MG tablet TAKE 1 TABLET BY MOUTH EVERYDAY AT BEDTIME Patient not taking: Reported on 01/02/2022 05/01/21   Jerline Pain, MD  aspirin 81 MG tablet Take 81 mg by mouth daily. Patient not taking: Reported on 12/22/2021    [provider]  divalproex (DEPAKOTE ER) 250 MG 24 hr tablet Take 1 tablet every night Patient not taking: Reported on 12/22/2021 01/07/2022   Rondel Jumbo, PA-C  furosemide (LASIX) 40 MG tablet TAKE 1 TABLET BY MOUTH EVERY OTHER DAY Patient not taking: Reported on 01/15/2022 11/01/21   Candee Furbish  C, MD  metoprolol succinate (TOPROL-XL)  100 MG 24 hr tablet TAKE 1 TABLET BY MOUTH DAILY. TAKE WITH OR IMMEDIATELY FOLLOWING A MEAL. Patient not taking: Reported on 12/16/2021 12/12/20   Jerline Pain, MD  mirtazapine (REMERON) 7.5 MG tablet Take 1 tablet (7.5 mg total) by mouth at bedtime. Patient not taking: Reported on 01/12/2022 12/24/2021   Rondel Jumbo, PA-C     Critical care time: 50 minutes     Georgann Housekeeper, AGACNP-BC [place] for personal pager PCCM on call pager [phone] until 7pm. Please call Elink 7p-7a. [phone]  12/23/2021 11:12 PM

## 2022-01-03 NOTE — ED Notes (Signed)
LR bolus on pressure bag

## 2022-01-03 NOTE — Progress Notes (Signed)
Assessment/Plan:   Moderate dementia with behavioral disturbance, likely LBD  It is unclear if there is a progression of the disease, versus behavioral disturbance.  During the last visit on 07/02/2021, her MMSE was 5/30.  The patient does not have any verbal lesion at this point, has stopped the Depakote because of the size of the pill.  However, this has not been crushed or mixed with applesauce, tube be able to swallow it, and continue it therapeutic effect.  In addition, she has significant low blood pressure, in view of decrease of liquid oral intake, and she is taking 2 blood pressure medications on a diuretic.  She is to see her PCP today after this visit, for the management of these medications, which may need adjustment.  Her daughter is to communicate with [place] to see if any improvements in her behavior are noted.  At this moment, the patient is in a wheelchair, falling asleep frequently but responding to commands.   Recommendations:  Discussed safety both in and out of the home.  Discussed the importance of regular daily schedule with inclusion of crossword puzzles to maintain brain function.  Continue to monitor mood by PCP Stay active at least 30 minutes at least 3 times a week.  Naps should be scheduled and should be no longer than 60 minutes and should not occur after 2 PM.  Mediterranean diet is recommended  Control cardiovascular risk factors  Continue donepezil 10 mg daily Side effects were discussed Consider reducing Mirtazapine to  7.5 mg nightly to help decrease somnolence Resume Depakote ER 250 mg nightly for mood, but crash and mix it with applesauce to be able to swallow properly. Follow up in 6 months.   Case discussed with Dr. Delice Lesch who agrees with the plan     Subjective:    Joan Hunter is a very pleasant 88 y.o. RH female with a history of diabetes, hypertension, CAD, lung cancer, moderate to severe dementia with behavioral disturbance.  Initial  presentation with hallucinations, suggestive of Lewy body dementia.  MRI of the brain without any acute changes, with moderate to severe chronic microvascular disease.  Initial MMSE was 15/30 in September 2020.  She is on donepezil 10 mg daily, as well as mirtazapine 15 mg nightly, although she stopped Depakote ER 250 mg nightly due to the size of the pill.  This was about 1 month ago, at which time she began to show some behavioral changes, decreasing her desire to eat, not wanting to do anything, and her daughter has to force her to mobilize.  The patient is seen in a wheelchair at this time.  Seen today in follow up for memory loss.  Her daughter supplements the history.  Prior records as well as any outside records available reviewed prior to today's visit.  She was last seen at our office on 07/02/2021.  Her daughter denies the patient having any hallucinations or paranoia.  Her daughter manages the medications, finances and meals.  She states that her mother sleeps extremely well at night, but she also sleeps too much during the day .her daughter has to change her diapers and close.  She also bathes her.  Her daughter is the main caretaker, there are no other aides providing care for her.  It is unclear if her memory has become worse, or her behavior hinders her cognitive status.    History on Initial Assessment 12/06/2016: This is an 88 yo RH woman with a history of diabetes, hypertension,  CAD, lung cancer, with worsening memory and hallucinations. When asked about her memory, she states there are some things she remembers and some she does not. She lives with her daughter and states that she cooks (she does not). She denies misplacing things but daughter shakes her head. Her daughter administers medications, otherwise she would not take them. Her daughter states that memory changes started only around 2 months ago, prior to this it was "pretty good." She started having difficulties remembering the days of  the week, asking her grandson why he was not in school, then a few minutes later asking him the same question. She used to fix her own medications, but over the past month, her daughter has to physically watch her take them. She used to live by herself but after 2 falls, her daughter has lived with her for the past 2 years. Daughter has always been in charge of bill payments. She stopped driving 5 years ago due to bad knees. She denied getting lost driving, her daughter states she can still give directions when someone else is driving.   Her daughter's main concern are the hallucinations. The patient thinks these are real and denies having any hallucinations. Her daughter cites multiple instances, these started in June but have been worsening. She would talk about people across the street or someone sitting outside, a dog across the street in the car. She told her daughter she is in trouble with the people she works with, that they were downstairs. She has seen a cat on the bed, or something under the bed, something eating her shoes. She would ask her daughter if the man downstairs was gone. She said there was a green car in the driveway, which was actually a large tarp. She saw a little boy sitting up on the tree outside. She called her daughter "Momma" this morning. She denies any auditory hallucinations. Her daughter denies any personality changes, no paranoia associated with these hallucinations. The patient denies any headaches, dizziness, diplopia, dysarthria, dysphagia, neck/back pain, focal numbness/tingling/weakness, bowel/bladder dysfunction. No anosmia, tremors,  PREVIOUS MEDICATIONS:   CURRENT MEDICATIONS:  Outpatient Encounter Medications as of 12/20/2021  Medication Sig   amLODipine (NORVASC) 5 MG tablet TAKE 1 TABLET BY MOUTH EVERYDAY AT BEDTIME   aspirin 81 MG tablet Take 81 mg by mouth daily.   atorvastatin (LIPITOR) 10 MG tablet TAKE 1 TABLET BY MOUTH EVERY OTHER DAY   furosemide (LASIX)  40 MG tablet TAKE 1 TABLET BY MOUTH EVERY OTHER DAY   levalbuterol (XOPENEX HFA) 45 MCG/ACT inhaler    metoprolol succinate (TOPROL-XL) 100 MG 24 hr tablet TAKE 1 TABLET BY MOUTH DAILY. TAKE WITH OR IMMEDIATELY FOLLOWING A MEAL.   pantoprazole (PROTONIX) 40 MG tablet Take 40 mg by mouth daily.    potassium chloride 20 MEQ/15ML (10%) SOLN Take 15 mLs (20 mEq total) by mouth daily.   PROAIR HFA 108 (90 Base) MCG/ACT inhaler Inhale 2 puffs into the lungs every 6 (six) hours as needed for wheezing or shortness of breath.   [DISCONTINUED] donepezil (ARICEPT) 10 MG tablet TAKE 1 TABLET BY MOUTH EVERY DAY   [DISCONTINUED] mirtazapine (REMERON) 15 MG tablet Take 1 tablet (15 mg total) by mouth at bedtime.   divalproex (DEPAKOTE ER) 250 MG 24 hr tablet Take 1 tablet every night   donepezil (ARICEPT) 10 MG tablet TAKE 1 TABLET BY MOUTH EVERY DAY   mirtazapine (REMERON) 7.5 MG tablet Take 1 tablet (7.5 mg total) by mouth at bedtime.   [  DISCONTINUED] divalproex (DEPAKOTE ER) 250 MG 24 hr tablet Take 1 tablet every night (Patient not taking: Reported on 12/21/2021)   [DISCONTINUED] mirtazapine (REMERON) 15 MG tablet Take 1 tablet (15 mg total) by mouth at bedtime.   No facility-administered encounter medications on file as of 12/28/2021.     Objective:     PHYSICAL EXAMINATION:    VITALS:   Vitals:   12/18/2021 1442  BP: (!) 81/60  Pulse: (!) 120  Resp: 20  SpO2: 95%  Weight: 81 lb (36.7 kg)  Height: 5\' 4"  (1.626 m)    GEN:  The patient appears stated age and is in NAD. HEENT:  Normocephalic, atraumatic.   Neurological examination:  General: NAD, well-groomed, appears stated age. Orientation: The patient is alert. Oriented to person, place and date Cranial nerves: There is good facial symmetry.The speech is fluent and clear. No aphasia or dysarthria. Fund of knowledge is appropriate. Recent and remote memory are impaired. Attention and concentration are reduced.  Able to name objects and  repeat phrases.  Hearing is intact to conversational tone.    Sensation: Sensation is intact to light touch throughout Motor: Strength is at least antigravity x4. Tremors: none  DTR's 2/4 in UE/LE    No flowsheet data found. MMSE - Mini Mental State Exam 07/02/2021 08/17/2019 01/13/2019  Not completed: - - -  Orientation to time 0 0 1  Orientation to Place 0 2 4  Registration 3 3 3   Attention/ Calculation 0 0 0  Recall 0 0 0  Language- name 2 objects 1 2 2   Language- repeat 0 0 0  Language- follow 3 step command 1 3 3   Language- read & follow direction 0 0 1  Write a sentence 0 0 1  Copy design 0 0 0  Total score 5 10 15     No flowsheet data found.     Movement examination: Tone: There is normal tone in the UE/LE Abnormal movements:  no tremor.  No myoclonus.  No asterixis.   Coordination:  There is  decremation with RAM's. Normal finger to nose on the right, she does not follow the instructions on the left Gait and Station the patient is wheelchair-bound, does not wish to participate in getting up to ambulate.      Total time spent on today's visit was 30 minutes, including both face-to-face time and nonface-to-face time. Time included that spent on review of records (prior notes available to me/labs/imaging if pertinent), discussing treatment and goals, answering patient's questions and coordinating care.  Cc:  [name], MD Sharene Butters, PA-C

## 2022-01-03 NOTE — ED Triage Notes (Signed)
Pt bib [facility] after witnessed cardiac arrest at home. Inially PEA with rate of 60. CPR for 15 minutes, ROSC after 2 epi. King airway in place. IO L tib.   BP: 110/54

## 2022-01-04 ENCOUNTER — Inpatient Hospital Stay (HOSPITAL_COMMUNITY): Payer: Medicare PPO

## 2022-01-04 DIAGNOSIS — I469 Cardiac arrest, cause unspecified: Secondary | ICD-10-CM

## 2022-01-04 LAB — URINALYSIS, ROUTINE W REFLEX MICROSCOPIC
Bilirubin Urine: NEGATIVE
Glucose, UA: 50 mg/dL — AB
Ketones, ur: NEGATIVE mg/dL
Nitrite: NEGATIVE
Protein, ur: NEGATIVE mg/dL
Specific Gravity, Urine: 1.009 (ref 1.005–1.030)
WBC, UA: >50 WBC/hpf — ABNORMAL HIGH (ref 0–5)
pH: 5 (ref 5.0–8.0)

## 2022-01-04 LAB — ECHOCARDIOGRAM COMPLETE
AR max vel: 1.91 cm2
AV Area VTI: 2.46 cm2
AV Area mean vel: 1.92 cm2
AV Mean grad: 3 mmHg
AV Peak grad: 5.4 mmHg
Ao pk vel: 1.16 m/s
Area-P 1/2: 4.83 cm2
Height: 64 in
Weight: 1703.71 oz

## 2022-01-04 LAB — PHOSPHORUS
Phosphorus: 4 mg/dL (ref 2.5–4.6)
Phosphorus: 3.3 mg/dL (ref 2.5–4.6)

## 2022-01-04 LAB — BASIC METABOLIC PANEL
Anion gap: 17 — ABNORMAL HIGH (ref 5–15)
BUN: 39 mg/dL — ABNORMAL HIGH (ref 8–23)
CO2: 20 mmol/L — ABNORMAL LOW (ref 22–32)
Calcium: 8.7 mg/dL — ABNORMAL LOW (ref 8.9–10.3)
Chloride: 104 mmol/L (ref 98–111)
Creatinine, Ser: 2.06 mg/dL — ABNORMAL HIGH (ref 0.44–1.00)
GFR, Estimated: 22 mL/min — ABNORMAL LOW (ref >60)
Glucose, Bld: 175 mg/dL — ABNORMAL HIGH (ref 70–99)
Potassium: 4 mmol/L (ref 3.5–5.1)
Sodium: 141 mmol/L (ref 135–145)

## 2022-01-04 LAB — CBC
HCT: 39.5 % (ref 36.0–46.0)
Hemoglobin: 12.7 g/dL (ref 12.0–15.0)
MCH: 29.1 pg (ref 26.0–34.0)
MCHC: 32.2 g/dL (ref 30.0–36.0)
MCV: 90.6 fL (ref 80.0–100.0)
Platelets: 178 10*3/uL (ref 150–400)
RBC: 4.36 MIL/uL (ref 3.87–5.11)
RDW: 14.3 % (ref 11.5–15.5)
WBC: 15.9 10*3/uL — ABNORMAL HIGH (ref 4.0–10.5)
nRBC: 0 % (ref 0.0–0.2)

## 2022-01-04 LAB — APTT: aPTT: 35 seconds (ref 24–36)

## 2022-01-04 LAB — MAGNESIUM
Magnesium: 2.1 mg/dL (ref 1.7–2.4)
Magnesium: 2 mg/dL (ref 1.7–2.4)

## 2022-01-04 LAB — HEPATIC FUNCTION PANEL
ALT: 442 U/L — ABNORMAL HIGH (ref 0–44)
AST: 766 U/L — ABNORMAL HIGH (ref 15–41)
Albumin: 2.7 g/dL — ABNORMAL LOW (ref 3.5–5.0)
Alkaline Phosphatase: 98 U/L (ref 38–126)
Bilirubin, Direct: 0.3 mg/dL — ABNORMAL HIGH (ref 0.0–0.2)
Indirect Bilirubin: 0.6 mg/dL (ref 0.3–0.9)
Total Bilirubin: 0.9 mg/dL (ref 0.3–1.2)
Total Protein: 6.9 g/dL (ref 6.5–8.1)

## 2022-01-04 LAB — LACTIC ACID, PLASMA: Lactic Acid, Venous: 6.2 mmol/L (ref 0.5–1.9)

## 2022-01-04 LAB — LIPASE, BLOOD: Lipase: 45 U/L (ref 11–51)

## 2022-01-04 LAB — GLUCOSE, CAPILLARY
Glucose-Capillary: 106 mg/dL — ABNORMAL HIGH (ref 70–99)
Glucose-Capillary: 139 mg/dL — ABNORMAL HIGH (ref 70–99)
Glucose-Capillary: 165 mg/dL — ABNORMAL HIGH (ref 70–99)
Glucose-Capillary: 181 mg/dL — ABNORMAL HIGH (ref 70–99)
Glucose-Capillary: 52 mg/dL — ABNORMAL LOW (ref 70–99)
Glucose-Capillary: 86 mg/dL (ref 70–99)

## 2022-01-04 LAB — MRSA NEXT GEN BY PCR, NASAL: MRSA by PCR Next Gen: NOT DETECTED

## 2022-01-04 LAB — TRIGLYCERIDES: Triglycerides: 49 mg/dL (ref <150)

## 2022-01-04 LAB — PROTIME-INR
INR: 1.3 — ABNORMAL HIGH (ref 0.8–1.2)
Prothrombin Time: 15.8 seconds — ABNORMAL HIGH (ref 11.4–15.2)

## 2022-01-04 LAB — AMYLASE: Amylase: 291 U/L — ABNORMAL HIGH (ref 28–100)

## 2022-01-04 MED ORDER — CHLORHEXIDINE GLUCONATE 0.12% ORAL RINSE (MEDLINE KIT)
15.0000 mL | Freq: Two times a day (BID) | OROMUCOSAL | Status: DC
  Administered 2022-01-04 – 2022-01-20 (×33): 15 mL via OROMUCOSAL

## 2022-01-04 MED ORDER — DEXTROSE 50 % IV SOLN
INTRAVENOUS | Status: AC
  Administered 2022-01-04: 50 mL
  Filled 2022-01-04: qty 50

## 2022-01-04 MED ORDER — VITAL AF 1.2 CAL PO LIQD
1000.0000 mL | ORAL | Status: DC
  Administered 2022-01-04 – 2022-01-19 (×16): 1000 mL
  Filled 2022-01-04 (×4): qty 1000

## 2022-01-04 MED ORDER — ORAL CARE MOUTH RINSE
15.0000 mL | OROMUCOSAL | Status: DC
  Administered 2022-01-04 – 2022-01-20 (×160): 15 mL via OROMUCOSAL

## 2022-01-04 MED ORDER — PROPOFOL 1000 MG/100ML IV EMUL: 0.0000 ug/kg/min | INTRAVENOUS | Status: DC

## 2022-01-04 NOTE — Progress Notes (Signed)
eLink Physician-Brief Progress Note Patient Name: MEKLIT COTTA DOB: November 12, 1927 MRN: [id number]   Date of Service  01/04/2022  HPI/Events of Note    eICU Interventions     88 yo female with hx of dementia , low BP at a doctor visit, and then referred to her PCP.  She was c/o abdominal discomfort, became unresponsive, then lost pulse- CPR started by fire department. Had ROSC in 25min, required intubation and started on pressors.    Assessment/plan: Seen via E-link PEA cardiac arrest. -etiology currently not known- - suspected sepsis trigger- on broad spectrum antibiotics -goal temp < 37C -pressors to goal MAP > 65 -continue vent support  -concern for anoxic brain injury- code status has been discussed by bedside team       01/04/2022, 1:33 AM

## 2022-01-04 NOTE — Progress Notes (Signed)
Dr Verlee Monte here to see patient order for removal of c collar ordered. Dr Verlee Monte states not to remove it until he speaks to Ortho. C collar inplace

## 2022-01-04 NOTE — Progress Notes (Signed)
EEG complete - results pending 

## 2022-01-04 NOTE — Procedures (Signed)
Telespecialist EEG Report Routine EEG EEG Date:  01/04/2022  EEG Read Date: 01/04/2022 Duration: 1:34:47 (1hr 61min) Clinical Indication: altered mental status, suspected seizures EEG Procedure: This is a digitally recorded routine EEG electroencephalogram. The international 10-20 electrode placement system is used for scalp electrode placement. Eighteen channels of scalp EEG are recorded. The data are stored digitally and reviewed in reformatted montages for optimal display. EEG Description: This EEG is not well organized.  There is prominent EMG artifact throughout the study precluding full evaluation of underlying EEG rhythms.  During the moments of no artifact, the background consists of voltage attenuated delta frequencies.  No definite epileptiform discharges or electrographic or electroclinical seizures were captured.   EKG: The EKG rhythm strip demonstrated NSR at 96 bpm. Activating procedures:  Photic stimulation was not performed.  Hyperventilation was not performed. EEG Classification: Abnormal  Severe background slowing, generalized Voltage attenuated EMG artifact/motion limited EEG Interpretation: This routine EEG recorded in the comatose state is abnormal.  The above findings suggest severe diffuse cerebral dysfunction and/or sedative effect.  EMG artifact precluded full evaluation of underlying eeg rhythms. No definite epileptiform discharges or electrographic or electroclinical seizures were captured.

## 2022-01-04 NOTE — Progress Notes (Signed)
Started cEEG study.  Notified [facility] monitoring.  Tested patient event button. 

## 2022-01-04 NOTE — Progress Notes (Signed)
Initial Nutrition Assessment  DOCUMENTATION CODES:   Underweight, Severe malnutrition in context of chronic illness  INTERVENTION:   Initiate enteral nutrition via OG tube: - Start Vital AF 1.2 @ 20 ml/hr and advance by 10 ml q 6 hours to goal rate of 50 ml/hr (1200 ml/day)  Tube feeding regimen at goal provides 1440 kcal, 90 grams of protein, and 973 ml of H2O.  Monitor magnesium, potassium, and phosphorus BID for at least 3 days, MD to replete as needed, as pt is at risk for refeeding syndrome given severe malnutrition, decreased PO intake PTA.  NUTRITION DIAGNOSIS:   Severe Malnutrition related to chronic illness (dementia, CHF) as evidenced by severe fat depletion, severe muscle depletion.  GOAL:   Patient will meet greater than or equal to 90% of their needs  MONITOR:   Vent status, Labs, Weight trends, TF tolerance  REASON FOR ASSESSMENT:   Ventilator, Consult Enteral/tube feeding initiation and management  ASSESSMENT:   88 year old female who presented to the ED on 1/19 after a witnessed cardiac arrest at home. Pt required intubation. PMH of dementia, lung cancer s/p curative radiotherapy, CAD, HTN, T2DM, CHF, HLD. Pt admitted with PEA cardiac arrest, possible sepsis, AKI from ATN secondary to shock.  Discussed pt with RN and during ICU rounds.  Consult received for tube feeding initiation and management. Pt with OG tube in stomach per abdominal x-ray on 1/19.  No family present at time of RD visit. Per review of notes, pt with decreased PO intake for several days PTA.  Reviewed weight history in chart. Pt with a 7.5 kg weight loss since 07/10/21. This is a 13.4% weight loss in 6 months which is severe and significant for timeframe. Question accuracy of weights given fluctuations: 47.2 kg on 07/02/21, 55.8 on 07/10/21, 50.8 kg on 09/20/21, 36.7 kg on 12/25/2021, and 48.3 kg on 01/04/22.  Patient is currently intubated on ventilator support MV: 8.5 L/min Temp (24hrs),  Avg:95.9 F (35.5 C), Min:92.9 F (33.8 C), Max:96.8 F (36 C) BP (cuff): 145/89 MAP (cuff): 106  Drips: Propofol: off this AM Levophed  Medications reviewed and include: colace, SSI q 4 hours, IV protonix, miralax, IV abx  Labs reviewed: BUN 39, creatinine 2.06, ionized calcium 1.09, elevated LFTs, lactic acid 6.2 CBG's: 86-259 x 24 hours  I/O's: +3.2 L since admit  NUTRITION - FOCUSED PHYSICAL EXAM:  Flowsheet Row Most Recent Value  Orbital Region Severe depletion  Upper Arm Region Severe depletion  Thoracic and Lumbar Region Moderate depletion  Buccal Region Unable to assess  Temple Region Moderate depletion  Clavicle Bone Region Severe depletion  Clavicle and Acromion Bone Region Severe depletion  Scapular Bone Region Unable to assess  Dorsal Hand Severe depletion  Patellar Region Severe depletion  Anterior Thigh Region Severe depletion  Posterior Calf Region Severe depletion  Edema (RD Assessment) None  Hair Reviewed  Eyes Unable to assess  Mouth Unable to assess  Skin Reviewed  Nails Reviewed       Diet Order:   Diet Order             Diet NPO time specified  Diet effective now                   EDUCATION NEEDS:   Not appropriate for education at this time  Skin:  Skin Assessment: Reviewed RN Assessment  Last BM:  01/06/2022  Height:   Ht Readings from Last 1 Encounters:  01/04/22 5\' 4"  (1.626 m)  Weight:   Wt Readings from Last 1 Encounters:  01/04/22 48.3 kg    BMI:  Body mass index is 18.28 kg/m.  Estimated Nutritional Needs:   Kcal:  1400-1600  Protein:  70-85 grams  Fluid:  1.4-1.6 L    Gustavus Bryant, MS, RD, LDN Inpatient Clinical Dietitian Please see [facility] for contact information.

## 2022-01-04 NOTE — Progress Notes (Signed)
NAME:  Joan Hunter, MRN:  [id number], DOB:  08/21/1927, LOS: 1 ADMISSION DATE:  01/15/2022, CONSULTATION DATE: 1/19 REFERRING MD: Dr. Kathrynn Humble, CHIEF COMPLAINT: Cardiac arrest  History of Present Illness:  88 year old female with past medical history as below, which is significant for dementia, lung cancer status post curative radiotherapy, coronary artery disease, dilated cardiomyopathy with previous ejection fraction 15% now resolved, hypertension, and diabetes mellitus.  The patient is unable to provide history so much is taken from her daughter who is at bedside at the time of evaluation.  At baseline the patient dementia sounds to be quite advanced.  She is only able to minimally assist with activities of daily living and her oral intake is fallen off quite a bit.  She was sleeping much more than normal in the days leading up to her presentation on 1/19.  Earlier that day she presented to the neurology office for a scheduled office visit regarding dementia.  Her blood pressure was noted to be quite low at that visit with systolic in the 02V.  She was referred to her PCPs office where she did go, however, it is unclear what was done.  Later that evening after returning home she became unresponsive.  Family called 60 and during the telephone conversation family noticed the patient stopped breathing and lost pulses.  First responders arrived several minutes later and began CPR for approximately 15 minutes prior to ROSC.  Initial rhythm was PEA.  Upon arrival to the emergency department the patient was endotracheally intubated.  She was started on norepinephrine infusion as well as bolused with crystalloid 2.5 L.  Laboratory evaluation significant for potassium 3.3, CO2 11, glucose 359, creatinine 2. 5, lactic acid greater than 9.  PCCM asked to admit.  Pertinent  Medical History   has a past medical history of Allergic rhinitis, Arthritis, CAD (coronary artery disease), CHF (congestive heart  failure) ([place]), Dementia ([place]), Diabetes mellitus, Dilated cardiomyopathy ([place]), Diverticulosis, Hyperlipidemia, Hypertension, Hypokalemia (01/09/2017), Lung cancer ([place]) (dx'd 06/2012), Lung mass, MR (mitral regurgitation), and Osteopenia.   Significant Hospital Events: Including procedures, antibiotic start and stop dates in addition to other pertinent events     Interim History / Subjective:  No acute issues overnight. No changes in exam overnight.   Objective   Blood pressure 108/84, pulse 78, temperature (!) 96.6 F (35.9 C), resp. rate 19, height 5\' 4"  (1.626 m), weight 48.3 kg, SpO2 99 %.    Vent Mode: PRVC FiO2 (%):  [40 %-60 %] 40 % Set Rate:  [20 bmp-24 bmp] 20 bmp Vt Set:  [440 mL] 440 mL PEEP:  [5 cmH20] 5 cmH20 Plateau Pressure:  [19 cmH20] 19 cmH20   Intake/Output Summary (Last 24 hours) at 01/04/2022 2536 Last data filed at 01/04/2022 0600 Gross per 24 hour  Intake 2515.78 ml  Output 675 ml  Net 1840.78 ml   Filed Weights   12/31/2021 2024 01/04/22 0000 01/04/22 0427  Weight: 50 kg 48.3 kg 48.3 kg    Examination: General: Frail, elderly HENT: Cervical collar in place, normocephalic atraumatic Lungs: mech breath sounds bl, equal chest rise Cardiovascular: Regular rate and rhythm no murmur Abdomen: Soft, nondistended, hypoactive BS Extremities: No acute deformity.  No edema. Neuro: withdraws to pain BLE  S Cr trending down LFTs trending up  UA with pyuria   Resolved Hospital Problem list     Assessment & Plan:   Cardiac arrest: Initial rhythm PEA.  Etiology not clear.  Patient had increased weakness over period  of days but had no complaints. -EEG -normothermia protocol -f/u TTE -consult ortho for c-spine clearance  Shock: Likely cardiogenic status post cardiac arrest, however, she was hypotensive neurology office.  Suspect this is due to either hypovolemic or distributive shock question sepsis although no clear source - possibility of UTI. -wean  norepi for MAP 65 -continue cefepime for now, stop vanc, flagyl -Echo as above  Acute hypoxemic respiratory failure - Full vent support - ABG reviewed and settings adjusted.  - VAP bundle - WAU and SBT today  Acute kidney injury Hypokalemia Hypocalcemia -trend bmet -avoid nephrotoxins  Transaminitis Suspect shock liver, no clear drug culprit -trend LFTs  DM - CBG monitoring and SSI  Failure to thrive Goals of care - will consider palliative care consultation  Best Practice (right click and "Reselect all SmartList Selections" daily)   Diet/type: NPO DVT prophylaxis: prophylactic heparin  GI prophylaxis: PPI Lines: N/A Foley:  Yes, and it is still needed Code Status:  full code Last date of multidisciplinary goals of care discussion [ ]   Critical care time: 35 minutes    [place] for personal pager PCCM on call pager [phone] until 7pm. Please call Elink 7p-7a. [phone]  01/04/2022 7:13 AM

## 2022-01-05 LAB — CBC
HCT: 36.5 % (ref 36.0–46.0)
Hemoglobin: 11.9 g/dL — ABNORMAL LOW (ref 12.0–15.0)
MCH: 29.2 pg (ref 26.0–34.0)
MCHC: 32.6 g/dL (ref 30.0–36.0)
MCV: 89.7 fL (ref 80.0–100.0)
Platelets: 137 10*3/uL — ABNORMAL LOW (ref 150–400)
RBC: 4.07 MIL/uL (ref 3.87–5.11)
RDW: 14.6 % (ref 11.5–15.5)
WBC: 15.7 10*3/uL — ABNORMAL HIGH (ref 4.0–10.5)
nRBC: 0 % (ref 0.0–0.2)

## 2022-01-05 LAB — GLUCOSE, CAPILLARY
Glucose-Capillary: 135 mg/dL — ABNORMAL HIGH (ref 70–99)
Glucose-Capillary: 146 mg/dL — ABNORMAL HIGH (ref 70–99)
Glucose-Capillary: 163 mg/dL — ABNORMAL HIGH (ref 70–99)
Glucose-Capillary: 177 mg/dL — ABNORMAL HIGH (ref 70–99)
Glucose-Capillary: 185 mg/dL — ABNORMAL HIGH (ref 70–99)
Glucose-Capillary: 192 mg/dL — ABNORMAL HIGH (ref 70–99)

## 2022-01-05 LAB — PHOSPHORUS
Phosphorus: 2.7 mg/dL (ref 2.5–4.6)
Phosphorus: 2.5 mg/dL (ref 2.5–4.6)

## 2022-01-05 LAB — HEPATIC FUNCTION PANEL
ALT: 286 U/L — ABNORMAL HIGH (ref 0–44)
AST: 277 U/L — ABNORMAL HIGH (ref 15–41)
Albumin: 2.4 g/dL — ABNORMAL LOW (ref 3.5–5.0)
Alkaline Phosphatase: 85 U/L (ref 38–126)
Bilirubin, Direct: 0.2 mg/dL (ref 0.0–0.2)
Indirect Bilirubin: 0.5 mg/dL (ref 0.3–0.9)
Total Bilirubin: 0.7 mg/dL (ref 0.3–1.2)
Total Protein: 6.7 g/dL (ref 6.5–8.1)

## 2022-01-05 LAB — BASIC METABOLIC PANEL
Anion gap: 11 (ref 5–15)
BUN: 30 mg/dL — ABNORMAL HIGH (ref 8–23)
CO2: 22 mmol/L (ref 22–32)
Calcium: 8.2 mg/dL — ABNORMAL LOW (ref 8.9–10.3)
Chloride: 107 mmol/L (ref 98–111)
Creatinine, Ser: 1.49 mg/dL — ABNORMAL HIGH (ref 0.44–1.00)
GFR, Estimated: 33 mL/min — ABNORMAL LOW (ref >60)
Glucose, Bld: 159 mg/dL — ABNORMAL HIGH (ref 70–99)
Potassium: 3.7 mmol/L (ref 3.5–5.1)
Sodium: 140 mmol/L (ref 135–145)

## 2022-01-05 LAB — MAGNESIUM
Magnesium: 2 mg/dL (ref 1.7–2.4)
Magnesium: 2 mg/dL (ref 1.7–2.4)

## 2022-01-05 LAB — HEMOGLOBIN A1C
Hgb A1c MFr Bld: 6.8 % — ABNORMAL HIGH (ref 4.8–5.6)
Mean Plasma Glucose: 148 mg/dL

## 2022-01-05 LAB — URINE CULTURE: Culture: <10000 — AB

## 2022-01-05 MED ORDER — SODIUM CHLORIDE 0.9 % IV SOLN
1.0000 g | INTRAVENOUS | Status: AC
  Administered 2022-01-05 – 2022-01-08 (×4): 1 g via INTRAVENOUS
  Filled 2022-01-05 (×4): qty 10

## 2022-01-05 NOTE — Procedures (Signed)
EEG Procedure CPT/Type of Study: 03500; 24hr EEG with video Referring Provider: Verlee Monte Primary Neurological Diagnosis: cardiac arrest  History: This is a 88 yr old patient, undergoing an EEG to evaluate for cardiac arrest. Clinical State: comatose  Technical Description:  The EEG was performed using standard setting per the guidelines of American Clinical Neurophysiology Society (ACNS).  A minimum of 21 electrodes were placed on scalp according to the International 10-20 or/and 10-10 Systems. Supplemental electrodes were placed as needed. Single EKG electrode was also used to detect cardiac arrhythmia. Patient's behavior was continuously recorded on video simultaneously with EEG. A minimum of 16 channels were used for data display. Each epoch of study was reviewed manually daily and as needed using standard referential and bipolar montages. Computerized quantitative EEG analysis (such as compressed spectral array analysis, trending, automated spike & seizure detection) were used as indicated.   Day 1: from 1117 01/04/22 to 0730 01/05/22  EEG Description: Overall Amplitude: suppressed Predominant Frequency: The background activity showed background suppression Superimposed Frequencies: none The background was symmetric  Background Abnormalities: complete background suppression Rhythmic or periodic pattern: No Epileptiform activity: no Electrographic seizures: no Events: no   Breach rhythm: no  Reactivity: Absent  Stimulation procedures:  Hyperventilation: not done Photic stimulation: no change  Sleep Background: none  EKG: abnormal EKG rhythm  Impression: This was a markedly abnormal continuous video EEG due to complete background suppression and absent reactivity, indicative of a severe anoxic injury pattern.

## 2022-01-05 NOTE — Progress Notes (Signed)
NAME:  Joan Hunter, MRN:  [id number], DOB:  12/03/1927, LOS: 2 ADMISSION DATE:  12/27/2021, CONSULTATION DATE: 1/19 REFERRING MD: Dr. Kathrynn Humble, CHIEF COMPLAINT: Cardiac arrest  History of Present Illness:  88 year old female with past medical history as below, which is significant for dementia, lung cancer status post curative radiotherapy, coronary artery disease, dilated cardiomyopathy with previous ejection fraction 15% now resolved, hypertension, and diabetes mellitus.  The patient is unable to provide history so much is taken from her daughter who is at bedside at the time of evaluation.  At baseline the patient dementia sounds to be quite advanced.  She is only able to minimally assist with activities of daily living and her oral intake is fallen off quite a bit.  She was sleeping much more than normal in the days leading up to her presentation on 1/19.  Earlier that day she presented to the neurology office for a scheduled office visit regarding dementia.  Her blood pressure was noted to be quite low at that visit with systolic in the 62I.  She was referred to her PCPs office where she did go, however, it is unclear what was done.  Later that evening after returning home she became unresponsive.  Family called 96 and during the telephone conversation family noticed the patient stopped breathing and lost pulses.  First responders arrived several minutes later and began CPR for approximately 15 minutes prior to ROSC.  Initial rhythm was PEA.  Upon arrival to the emergency department the patient was endotracheally intubated.  She was started on norepinephrine infusion as well as bolused with crystalloid 2.5 L.  Laboratory evaluation significant for potassium 3.3, CO2 11, glucose 359, creatinine 2. 5, lactic acid greater than 9.  PCCM asked to admit.  Pertinent  Medical History   has a past medical history of Allergic rhinitis, Arthritis, CAD (coronary artery disease), CHF (congestive heart  failure) ([place]), Dementia ([place]), Diabetes mellitus, Dilated cardiomyopathy ([place]), Diverticulosis, Hyperlipidemia, Hypertension, Hypokalemia (01/09/2017), Lung cancer ([place]) (dx'd 06/2012), Lung mass, MR (mitral regurgitation), and Osteopenia.   Significant Hospital Events: Including procedures, antibiotic start and stop dates in addition to other pertinent events     Interim History / Subjective:  No acute issues overnight. No changes in exam overnight.   Objective   Blood pressure 128/90, pulse 99, temperature 98.1 F (36.7 C), temperature source Esophageal, resp. rate 16, height 5\' 4"  (1.626 m), weight 48.3 kg, SpO2 100 %.    Vent Mode: PRVC FiO2 (%):  [40 %] 40 % Set Rate:  [16 bmp] 16 bmp Vt Set:  [440 mL-460 mL] 440 mL PEEP:  [5 cmH20] 5 cmH20 Plateau Pressure:  [12 cmH20-19 cmH20] 12 cmH20   Intake/Output Summary (Last 24 hours) at 01/05/2022 0820 Last data filed at 01/05/2022 0800 Gross per 24 hour  Intake 644.29 ml  Output 870 ml  Net -225.71 ml   Filed Weights   12/30/2021 2024 01/04/22 0000 01/04/22 0427  Weight: 50 kg 48.3 kg 48.3 kg    Examination: General: Frail, elderly HEENT: Cervical collar in place, normocephalic atraumatic, PERRL Lungs: mech breath sounds bl, equal chest rise Cardiovascular: Regular rate and rhythm no murmur Abdomen: Soft, nondistended, hypoactive BS Extremities: No acute deformity.  No edema. Neuro: withdraws to pain RLE, triggers vent  S Cr trending down LFTs trending down  TTE with global hypokinesis EF 40%, G1DD   Resolved Hospital Problem list   Shock  Assessment & Plan:   Cardiac arrest: Initial rhythm PEA.  Etiology  not clear.  Patient had increased weakness over period of days but had no complaints. -f/u EEG -normothermia protocol -consult ortho for c-spine clearance -keep sedation light  Sepsis due to UTI: -switch to ceftriaxone to complete 5d course  Acute hypoxemic respiratory failure - Full vent support - ABG  reviewed and settings adjusted.  - VAP bundle - WAU and SBT today but mental status will preclude extubation  Acute kidney injury Hypokalemia Hypocalcemia -trend bmet -avoid nephrotoxins  Transaminitis Suspect shock liver, no clear drug culprit -trend LFTs  DM - CBG monitoring and SSI  Failure to thrive Goals of care - will consider palliative care consultation  Best Practice (right click and "Reselect all SmartList Selections" daily)   Diet/type: tubefeeds DVT prophylaxis: prophylactic heparin  GI prophylaxis: PPI Lines: N/A Foley:  Yes, and it is still needed Code Status:  full code Last date of multidisciplinary goals of care discussion Aletha Halim update family later today ]  Critical care time: 34 minutes    [place] for personal pager PCCM on call pager [phone] until 7pm. Please call Elink 7p-7a. [phone]  01/05/2022 8:20 AM

## 2022-01-06 DIAGNOSIS — Z7189 Other specified counseling: Secondary | ICD-10-CM

## 2022-01-06 DIAGNOSIS — E43 Unspecified severe protein-calorie malnutrition: Secondary | ICD-10-CM | POA: Insufficient documentation

## 2022-01-06 DIAGNOSIS — Z515 Encounter for palliative care: Secondary | ICD-10-CM | POA: Diagnosis not present

## 2022-01-06 LAB — CBC
HCT: 31.3 % — ABNORMAL LOW (ref 36.0–46.0)
Hemoglobin: 10 g/dL — ABNORMAL LOW (ref 12.0–15.0)
MCH: 29.8 pg (ref 26.0–34.0)
MCHC: 31.9 g/dL (ref 30.0–36.0)
MCV: 93.2 fL (ref 80.0–100.0)
Platelets: 126 K/uL — ABNORMAL LOW (ref 150–400)
RBC: 3.36 MIL/uL — ABNORMAL LOW (ref 3.87–5.11)
RDW: 15.2 % (ref 11.5–15.5)
WBC: 15.2 K/uL — ABNORMAL HIGH (ref 4.0–10.5)
nRBC: 0 % (ref 0.0–0.2)

## 2022-01-06 LAB — POCT I-STAT 7, (LYTES, BLD GAS, ICA,H+H)
Acid-Base Excess: 1 mmol/L (ref 0.0–2.0)
Bicarbonate: 24.3 mmol/L (ref 20.0–28.0)
Calcium, Ion: 1.18 mmol/L (ref 1.15–1.40)
HCT: 29 % — ABNORMAL LOW (ref 36.0–46.0)
Hemoglobin: 9.9 g/dL — ABNORMAL LOW (ref 12.0–15.0)
O2 Saturation: 98 %
Patient temperature: 36.7
Potassium: 4.9 mmol/L (ref 3.5–5.1)
Sodium: 142 mmol/L (ref 135–145)
TCO2: 25 mmol/L (ref 22–32)
pCO2 arterial: 33.9 mmHg (ref 32.0–48.0)
pH, Arterial: 7.462 — ABNORMAL HIGH (ref 7.350–7.450)
pO2, Arterial: 105 mmHg (ref 83.0–108.0)

## 2022-01-06 LAB — BASIC METABOLIC PANEL WITH GFR
Anion gap: 9 (ref 5–15)
BUN: 36 mg/dL — ABNORMAL HIGH (ref 8–23)
CO2: 21 mmol/L — ABNORMAL LOW (ref 22–32)
Calcium: 7.8 mg/dL — ABNORMAL LOW (ref 8.9–10.3)
Chloride: 111 mmol/L (ref 98–111)
Creatinine, Ser: 1.25 mg/dL — ABNORMAL HIGH (ref 0.44–1.00)
GFR, Estimated: 40 mL/min — ABNORMAL LOW
Glucose, Bld: 210 mg/dL — ABNORMAL HIGH (ref 70–99)
Potassium: 3.3 mmol/L — ABNORMAL LOW (ref 3.5–5.1)
Sodium: 141 mmol/L (ref 135–145)

## 2022-01-06 LAB — PHOSPHORUS
Phosphorus: 2 mg/dL — ABNORMAL LOW (ref 2.5–4.6)
Phosphorus: 1.2 mg/dL — ABNORMAL LOW (ref 2.5–4.6)

## 2022-01-06 LAB — GLUCOSE, CAPILLARY
Glucose-Capillary: 198 mg/dL — ABNORMAL HIGH (ref 70–99)
Glucose-Capillary: 188 mg/dL — ABNORMAL HIGH (ref 70–99)
Glucose-Capillary: 183 mg/dL — ABNORMAL HIGH (ref 70–99)
Glucose-Capillary: 139 mg/dL — ABNORMAL HIGH (ref 70–99)
Glucose-Capillary: 164 mg/dL — ABNORMAL HIGH (ref 70–99)
Glucose-Capillary: 165 mg/dL — ABNORMAL HIGH (ref 70–99)

## 2022-01-06 LAB — HEPATIC FUNCTION PANEL
ALT: 166 U/L — ABNORMAL HIGH (ref 0–44)
AST: 89 U/L — ABNORMAL HIGH (ref 15–41)
Albumin: 2.1 g/dL — ABNORMAL LOW (ref 3.5–5.0)
Alkaline Phosphatase: 87 U/L (ref 38–126)
Bilirubin, Direct: <0.1 mg/dL (ref 0.0–0.2)
Total Bilirubin: 0.3 mg/dL (ref 0.3–1.2)
Total Protein: 6.1 g/dL — ABNORMAL LOW (ref 6.5–8.1)

## 2022-01-06 LAB — MAGNESIUM
Magnesium: 2.1 mg/dL (ref 1.7–2.4)
Magnesium: 2 mg/dL (ref 1.7–2.4)

## 2022-01-06 LAB — LACTIC ACID, PLASMA: Lactic Acid, Venous: 2.9 mmol/L (ref 0.5–1.9)

## 2022-01-06 LAB — TROPONIN I (HIGH SENSITIVITY): Troponin I (High Sensitivity): 110 ng/L

## 2022-01-06 MED ORDER — POTASSIUM CHLORIDE 20 MEQ PO PACK
20.0000 meq | PACK | ORAL | Status: AC
  Administered 2022-01-06 (×2): 20 meq
  Filled 2022-01-06 (×2): qty 1

## 2022-01-06 MED ORDER — LACTATED RINGERS IV BOLUS
1000.0000 mL | Freq: Once | INTRAVENOUS | Status: AC
  Administered 2022-01-06: 1000 mL via INTRAVENOUS

## 2022-01-06 MED ORDER — IPRATROPIUM-ALBUTEROL 0.5-2.5 (3) MG/3ML IN SOLN: 3.0000 mL | Freq: Four times a day (QID) | RESPIRATORY_TRACT | Status: DC | PRN

## 2022-01-06 MED ORDER — POTASSIUM CHLORIDE 10 MEQ/100ML IV SOLN
10.0000 meq | INTRAVENOUS | Status: AC
  Administered 2022-01-06 (×3): 10 meq via INTRAVENOUS
  Filled 2022-01-06 (×4): qty 100

## 2022-01-06 MED ORDER — POTASSIUM CHLORIDE 10 MEQ/100ML IV SOLN
10.0000 meq | Freq: Once | INTRAVENOUS | Status: AC
  Administered 2022-01-06: 10 meq via INTRAVENOUS

## 2022-01-06 NOTE — Progress Notes (Signed)
PCCM Brief Progress Note  Case discussed with neurosurgery attending Dr. Christella Noa. Although she can't tell us whether she has tenderness, with good technical quality CT without evidence of c-spine injury, she is cleared to have collar come off, especially in light of her limited mobility.  [place]

## 2022-01-06 NOTE — Progress Notes (Signed)
NAME:  Joan Hunter, MRN:  [id number], DOB:  08/22/1927, LOS: 3 ADMISSION DATE:  12/29/2021, CONSULTATION DATE: 1/19 REFERRING MD: Dr. Kathrynn Humble, CHIEF COMPLAINT: Cardiac arrest  History of Present Illness:  88 year old female with past medical history as below, which is significant for dementia, lung cancer status post curative radiotherapy, coronary artery disease, dilated cardiomyopathy with previous ejection fraction 15% now resolved, hypertension, and diabetes mellitus.  The patient is unable to provide history so much is taken from her daughter who is at bedside at the time of evaluation.  At baseline the patient dementia sounds to be quite advanced.  She is only able to minimally assist with activities of daily living and her oral intake is fallen off quite a bit.  She was sleeping much more than normal in the days leading up to her presentation on 1/19.  Earlier that day she presented to the neurology office for a scheduled office visit regarding dementia.  Her blood pressure was noted to be quite low at that visit with systolic in the 63K.  She was referred to her PCPs office where she did go, however, it is unclear what was done.  Later that evening after returning home she became unresponsive.  Family called 57 and during the telephone conversation family noticed the patient stopped breathing and lost pulses.  First responders arrived several minutes later and began CPR for approximately 15 minutes prior to ROSC.  Initial rhythm was PEA.  Upon arrival to the emergency department the patient was endotracheally intubated.  She was started on norepinephrine infusion as well as bolused with crystalloid 2.5 L.  Laboratory evaluation significant for potassium 3.3, CO2 11, glucose 359, creatinine 2. 5, lactic acid greater than 9.  PCCM asked to admit.  Pertinent  Medical History   has a past medical history of Allergic rhinitis, Arthritis, CAD (coronary artery disease), CHF (congestive heart  failure) ([place]), Dementia ([place]), Diabetes mellitus, Dilated cardiomyopathy ([place]), Diverticulosis, Hyperlipidemia, Hypertension, Hypokalemia (01/09/2017), Lung cancer ([place]) (dx'd 06/2012), Lung mass, MR (mitral regurgitation), and Osteopenia.   Significant Hospital Events: Including procedures, antibiotic start and stop dates in addition to other pertinent events     Interim History / Subjective:  No acute issues overnight, no change in exam. Little urine output.  Objective   Blood pressure (!) 83/61, pulse (!) 125, temperature (!) 97.5 F (36.4 C), resp. rate 19, height 5\' 4"  (1.626 m), weight 48.3 kg, SpO2 100 %.    Vent Mode: PRVC FiO2 (%):  [40 %] 40 % Set Rate:  [16 bmp] 16 bmp Vt Set:  [440 mL] 440 mL PEEP:  [5 cmH20] 5 cmH20 Plateau Pressure:  [12 cmH20-15 cmH20] 12 cmH20   Intake/Output Summary (Last 24 hours) at 01/06/2022 1601 Last data filed at 01/06/2022 0600 Gross per 24 hour  Intake 875.5 ml  Output 348 ml  Net 527.5 ml   Filed Weights   01/12/2022 2024 01/04/22 0000 01/04/22 0427  Weight: 50 kg 48.3 kg 48.3 kg    Examination: General: Frail, elderly HEENT: Cervical collar in place, normocephalic atraumatic, PERRL Lungs: mech breath sounds bl, equal chest rise Cardiovascular: tachy IRIR, no murmur Abdomen: Soft, nondistended, hypoactive BS Extremities: No acute deformity.  No edema. Neuro: withdraws to pain BLE, flicker RUE, triggers vent  S Cr trending down LFTs trending down  TTE with global hypokinesis EF 40%, G1DD   Resolved Hospital Problem list   Shock  Assessment & Plan:   Cardiac arrest: Anoxic brain injury:  Initial  rhythm PEA.  Etiology not clear.  Patient had increased weakness over period of days but had no complaints. -f/u EEG -mri brain tomorrow  -normothermia protocol -consult nsgy for c-spine clearance - she's having some skin irritation -keep sedation light  Sepsis due to UTI: -ceftriaxone to complete 5d course  Acute hypoxemic  respiratory failure - Full vent support, lung protective settings - VAP bundle - WAU and SBT today but mental status will preclude extubation - keep sedation light  Acute kidney injury Hypokalemia Hypocalcemia -trend bmet -avoid nephrotoxins  Transaminitis Suspect shock liver, no clear drug culprit -trend LFTs  DM - CBG monitoring and SSI  Failure to thrive Goals of care - palliative care consultation  Best Practice (right click and "Reselect all SmartList Selections" daily)   Diet/type: tubefeeds DVT prophylaxis: prophylactic heparin  GI prophylaxis: PPI Lines: N/A Foley:  Yes, and it is still needed Code Status:  full code Last date of multidisciplinary goals of care discussion [spoke with daughter and granddaughter. She was made limited code - no CPR, shocks in event of cardiac arrest. We talked about very low likelihood of recovery from this to a degree of functional status that most patients would want - they remain hopeful however for recovery. They agreed to speak with palliative care, consulted today.]  Critical care time: 34 minutes    [place] for personal pager PCCM on call pager [phone] until 7pm. Please call Elink 7p-7a. [phone]  01/06/2022 9:26 AM

## 2022-01-06 NOTE — Consult Note (Signed)
Palliative Medicine Inpatient Consult Note  Consulting Provider: Maryjane Hurter, MD  Reason for consult:   [place] Palliative Medicine Consult  Reason for Consult? 88yF with anoxic brain injury post arrest, family remains hopeful for recovery   HPI:  Per intake H&P --> 88 year old female with past medical history as below, which is significant for dementia, lung cancer status post curative radiotherapy, coronary artery disease, dilated cardiomyopathy with previous ejection fraction 15% now resolved, hypertension, and diabetes mellitus.   Palliative care has been consult for goals of care in the setting of cardiac arrest with anoxic brain injury.   Clinical Assessment/Goals of Care:  *Please note that this is a verbal dictation therefore any spelling or grammatical errors are due to the "[place] One" system interpretation.  I have reviewed medical records including EPIC notes, labs and imaging, received report from bedside RN, assessed the patient.    I called patients daughter, Larena Glassman  to further discuss diagnosis prognosis, GOC, EOL wishes, disposition and options.   I introduced Palliative Medicine as specialized medical care for people living with serious illness. It focuses on providing relief from the symptoms and stress of a serious illness. The goal is to improve quality of life for both the patient and the family.  Medical History Review and Understanding:  [name] shares with me that her mother suffered a cardiac arrest, She recounts the day when it occurred reviewed her mothers history of dementia, CAD, and a weakened heart.  [name] shares that she realizes her mother will likely not recover from this event though she wants to see what the MRI reveals tomorrow.   Social History:  Patient is from [place], [place]. She is a widow as her spouse died in 10. She has one daughter and one granddaughter. 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 Family is important to this patient. Patient is faithful and of the [facility] denomination.   Functional and Nutritional State:  Patient was able to do most bADL's prior to admission though her daughter did help her with bathing. [name] lives with Mistie and is her primary caregiver.   Advance Directives: There are no known advance directives that have been completed.  Code Status: Presently, Talaysha will only receive ongoing mechanical ventilation.   Provided "Hard Choices for [employer]" booklet via email.  Discussion: Plan to reconvene tomorrow after the MRI results are resulted. [name] would like some time to speak to her daughter and digest the information regarding her mothers guarded prognosis.  Discussed the importance of continued conversation with family and their  medical providers regarding overall plan of care and treatment options, ensuring decisions are within the context of the patients values and GOCs.  Decision Maker: Larena Glassman (spouse): [phone]  SUMMARY OF RECOMMENDATIONS   Partial as is Intubated and on ventilator   Plan to see what MRI tomorrow reveals  Ongoing Palliative support in the meanwhile  Code Status/Advance Care Planning: Partial Code  Palliative Prophylaxis:  Aspiration, Bowel Regimen, Delirium Protocol, Frequent Pain Assessment, Oral Care, Palliative Wound Care, and Turn Reposition  Additional Recommendations (Limitations, Scope, Preferences): Continue present care measures  Psycho-social/Spiritual:  Desire for further Chaplaincy support: Not at this time Additional Recommendations: Education post arrest   Prognosis: Very poor overall given patients already significant comorbid illness  Discharge Planning: Discharge plan is uncertain presently.   Vitals:   01/06/22 0815 01/06/22 0900  BP:  (!) 89/68  Pulse:  Resp:  19  Temp:  98.1 F (36.7 C)  SpO2: 100%      Intake/Output Summary (Last 24 hours) at 01/06/2022 1031 Last data filed at 01/06/2022 3244 Gross per 24 hour  Intake 880.5 ml  Output 356 ml  Net 524.5 ml   Last Weight  Most recent update: 01/04/2022  4:27 AM    Weight  48.3 kg (106 lb 7.7 oz)            Gen:  Extremely ill appearing geriatric AA F HEENT: ETT, OG, dry mucous membranes CV: Irregular rate and rhythm  PULM: Mechanical ventilation ABD: soft/nontender  EXT: No edema  Neuro: Does not awaken or follow commands  PPS: 10%   This conversation/these recommendations were discussed with patient primary care team, Dr. Verlee Monte  MDM High  Medical Decision Making: 4 #/Complex Problems: 4                      Data Reviewed:  4               Management:4 (1-Straightforward, 2-Low, 3-Moderate, 4-High) ______________________________________________________ [place] Team Team Cell Phone: [phone] Please utilize secure chat with additional questions, if there is no response within 30 minutes please call the above phone number  Palliative Medicine Team providers are available by phone from 7am to 7pm daily and can be reached through the team cell phone.  Should this patient require assistance outside of these hours, please call the patient's attending physician.

## 2022-01-06 NOTE — Progress Notes (Signed)
LTM maintain done at bedside. No skin breakdown noted. Results pending

## 2022-01-06 NOTE — Progress Notes (Signed)
[facility] ADULT ICU REPLACEMENT PROTOCOL   The patient does apply for the [facility] Adult ICU Electrolyte Replacment Protocol based on the criteria listed below:   1.Exclusion criteria: TCTS patients, ECMO patients, and Dialysis patients 2. Is GFR >/= 30 ml/min? Yes.    Patient's GFR today is 40 3. Is SCr </= 2? Yes.   Patient's SCr is 1.25 mg/dL 4. Did SCr increase >/= 0.5 in 24 hours? No. 5.Pt's weight >40kg  Yes.   6. Abnormal electrolyte(s): K+ 3.3  7. Electrolytes replaced per protocol 8.  Call MD STAT for K+ </= 2.5, Phos </= 1, or Mag </= 1 Physician:  Dr Fransisca Connors, Elfredia Nevins 01/06/2022 6:10 AM

## 2022-01-06 NOTE — Procedures (Signed)
EEG Procedure CPT/Type of Study: 82800; 24hr EEG with video Referring Provider: Verlee Monte Primary Neurological Diagnosis: cardiac arrest  History: This is a 88 yr old patient, undergoing an EEG to evaluate for cardiac arrest. Clinical State: comatose  Technical Description:  The EEG was performed using standard setting per the guidelines of American Clinical Neurophysiology Society (ACNS).  A minimum of 21 electrodes were placed on scalp according to the International 10-20 or/and 10-10 Systems. Supplemental electrodes were placed as needed. Single EKG electrode was also used to detect cardiac arrhythmia. Patient's behavior was continuously recorded on video simultaneously with EEG. A minimum of 16 channels were used for data display. Each epoch of study was reviewed manually daily and as needed using standard referential and bipolar montages. Computerized quantitative EEG analysis (such as compressed spectral array analysis, trending, automated spike & seizure detection) were used as indicated.   Day 2: from 0730 01/05/22 to 0730 01/06/22  EEG Description: Overall Amplitude: low/suppressed Predominant Frequency: The background activity showed background suppression with emergence of some low voltage 1-3Hz  activity overnight.  Superimposed Frequencies: none The background was symmetric  Background Abnormalities: background suppression with some generalized slowing Rhythmic or periodic pattern: GPDs; slow blunted GPD/triphasic waves, 0.25-0.5Hz  Epileptiform activity: no Electrographic seizures: no Events: no   Breach rhythm: no  Reactivity: Absent  Stimulation procedures:  Hyperventilation: not done Photic stimulation: no change  Sleep Background: none  EKG: abnormal EKG rhythm  Impression: This was a markedly abnormal continuous video EEG due to background suppression with generalized slowing and some GPDs, indicative a severe diffuse cerebral disturbance. No seizures were seen.  The study can be discontinued.

## 2022-01-06 NOTE — TOC Progression Note (Signed)
Transition of Care [facility]) - Progression Note    Patient Details  Name: Joan Hunter MRN: [id number] Date of Birth: 06/29/1927  Transition [facility]) CM/SW Contact  Zenon Mayo, RN Phone Number: 01/06/2022, 1:19 PM  Clinical Narrative:     Transition of Care [facility]) Screening Note   Patient Details  Name: Joan Hunter Date of Birth: 10/13/1927   Transition of Care [facility]) CM/SW Contact:    Zenon Mayo, RN Phone Number: 01/06/2022, 1:20 PM    Transition of Care Department [facility]) has reviewed patient and no TOC needs have been identified at this time. We will continue to monitor patient advancement through interdisciplinary progression rounds. If new patient transition needs arise, please place a TOC consult.          Expected Discharge Plan and Services                                                 Social Determinants of Health (SDOH) Interventions    Readmission Risk Interventions No flowsheet data found.

## 2022-01-07 DIAGNOSIS — E43 Unspecified severe protein-calorie malnutrition: Secondary | ICD-10-CM

## 2022-01-07 DIAGNOSIS — Z515 Encounter for palliative care: Secondary | ICD-10-CM | POA: Diagnosis not present

## 2022-01-07 DIAGNOSIS — I469 Cardiac arrest, cause unspecified: Secondary | ICD-10-CM | POA: Diagnosis not present

## 2022-01-07 LAB — BASIC METABOLIC PANEL
Anion gap: 13 (ref 5–15)
BUN: 35 mg/dL — ABNORMAL HIGH (ref 8–23)
CO2: 16 mmol/L — ABNORMAL LOW (ref 22–32)
Calcium: 7.8 mg/dL — ABNORMAL LOW (ref 8.9–10.3)
Chloride: 110 mmol/L (ref 98–111)
Creatinine, Ser: 1.02 mg/dL — ABNORMAL HIGH (ref 0.44–1.00)
GFR, Estimated: 52 mL/min — ABNORMAL LOW (ref >60)
Glucose, Bld: 184 mg/dL — ABNORMAL HIGH (ref 70–99)
Potassium: 4.7 mmol/L (ref 3.5–5.1)
Sodium: 139 mmol/L (ref 135–145)

## 2022-01-07 LAB — PHOSPHORUS: Phosphorus: 1.2 mg/dL — ABNORMAL LOW (ref 2.5–4.6)

## 2022-01-07 LAB — CBC
HCT: 29 % — ABNORMAL LOW (ref 36.0–46.0)
Hemoglobin: 9.3 g/dL — ABNORMAL LOW (ref 12.0–15.0)
MCH: 29.8 pg (ref 26.0–34.0)
MCHC: 32.1 g/dL (ref 30.0–36.0)
MCV: 92.9 fL (ref 80.0–100.0)
Platelets: 136 10*3/uL — ABNORMAL LOW (ref 150–400)
RBC: 3.12 MIL/uL — ABNORMAL LOW (ref 3.87–5.11)
RDW: 16 % — ABNORMAL HIGH (ref 11.5–15.5)
WBC: 15.2 10*3/uL — ABNORMAL HIGH (ref 4.0–10.5)
nRBC: 0.2 % (ref 0.0–0.2)

## 2022-01-07 LAB — GLUCOSE, CAPILLARY
Glucose-Capillary: 44 mg/dL — CL (ref 70–99)
Glucose-Capillary: 161 mg/dL — ABNORMAL HIGH (ref 70–99)
Glucose-Capillary: 128 mg/dL — ABNORMAL HIGH (ref 70–99)
Glucose-Capillary: 140 mg/dL — ABNORMAL HIGH (ref 70–99)
Glucose-Capillary: 170 mg/dL — ABNORMAL HIGH (ref 70–99)
Glucose-Capillary: 155 mg/dL — ABNORMAL HIGH (ref 70–99)
Glucose-Capillary: 168 mg/dL — ABNORMAL HIGH (ref 70–99)

## 2022-01-07 LAB — MAGNESIUM: Magnesium: 2.1 mg/dL (ref 1.7–2.4)

## 2022-01-07 LAB — HEPATIC FUNCTION PANEL
ALT: 106 U/L — ABNORMAL HIGH (ref 0–44)
AST: 61 U/L — ABNORMAL HIGH (ref 15–41)
Albumin: 2 g/dL — ABNORMAL LOW (ref 3.5–5.0)
Alkaline Phosphatase: 71 U/L (ref 38–126)
Bilirubin, Direct: <0.1 mg/dL (ref 0.0–0.2)
Total Bilirubin: 0.3 mg/dL (ref 0.3–1.2)
Total Protein: 5.9 g/dL — ABNORMAL LOW (ref 6.5–8.1)

## 2022-01-07 MED ORDER — SODIUM PHOSPHATES 45 MMOLE/15ML IV SOLN
30.0000 mmol | Freq: Once | INTRAVENOUS | Status: AC
  Administered 2022-01-07: 30 mmol via INTRAVENOUS
  Filled 2022-01-07: qty 10

## 2022-01-07 MED ORDER — METOPROLOL TARTRATE 25 MG/10 ML ORAL SUSPENSION
25.0000 mg | Freq: Two times a day (BID) | ORAL | Status: DC
  Administered 2022-01-07 – 2022-01-08 (×3): 25 mg
  Filled 2022-01-07 (×4): qty 10

## 2022-01-07 NOTE — Procedures (Signed)
[name]  MRN: [id number]  Epilepsy Attending: Lora Havens  Referring Physician/Provider: Dr Ulice Brilliant Duration: 01/07/2022 0730 01/07/2022 0956   Patient history: 88 yr old patient, undergoing an EEG to evaluate for cardiac arrest.   Level of alertness: comatose   AEDs during EEG study: None   Technical aspects: This EEG study was done with scalp electrodes positioned according to the 10-20 International system of electrode placement. Electrical activity was acquired at a sampling rate of 500Hz  and reviewed with a high frequency filter of 70Hz  and a low frequency filter of 1Hz . EEG data were recorded continuously and digitally stored.    Description: EEG showed continuous generalized 3 to 6 Hz theta-delta slowing. Generalized periodic discharges were noted at 0.25 2.5 Hz. Hyperventilation and photic stimulation were not performed.      ABNORMALITY - Periodic discharges, generalized ( GPDs) - Continuous slow, generalized   IMPRESSION: This study showed evidence of epileptogenicity with generalized onset.  Additionally EEG is suggestive moderate to severe diffuse encephalopathy, nonspecific etiology but could be secondary to anoxic/hypoxic brain injury in the setting of cardiac arrest.  No seizures were seen throughout the recording.     Lanyiah Brix Barbra Sarks

## 2022-01-07 NOTE — Progress Notes (Signed)
° °  Palliative Medicine Inpatient Follow Up Note  HPI:  Per intake H&P --> 88 year old female with past medical history as below, which is significant for dementia, lung cancer status post curative radiotherapy, coronary artery disease, dilated cardiomyopathy with previous ejection fraction 15% now resolved, hypertension, and diabetes mellitus.    Palliative care has been consult for goals of care in the setting of cardiac arrest with anoxic brain injury.   Today's Discussion (01/07/2022):  *Please note that this is a verbal dictation therefore any spelling or grammatical errors are due to the "[place] One" system interpretation.  Chart reviewed inclusive of progress notes, laboratory results, and diagnostic images.   Spoke to patients evening RN, per review there were no significant issues throughout the night,   I met at bedside with Joan Hunter this morning. She remains intubated and was not responsive to me.   I called patients daughter, Joan Hunter later in the day. I shared that the MRI still has not been complete and I understand the importance of that getting done prior to any additional decisions being made. Joan Hunter feels encouraged that patient is "breathing on her own some" today. Reviewed the importance of ongoing conversations once additional data is obtained.   Questions and concerns addressed   Palliative Support Provided  Objective Assessment: Vital Signs Vitals:   01/07/22 1200 01/07/22 1550  BP: 117/80   Pulse: 99   Resp: 18   Temp: 99.5 F (37.5 C)   SpO2: 100% 100%    Intake/Output Summary (Last 24 hours) at 01/07/2022 0076 Last data filed at 01/07/2022 1509 Gross per 24 hour  Intake 950 ml  Output 650 ml  Net 300 ml   Last Weight  Most recent update: 01/07/2022  4:40 AM    Weight  52.7 kg (116 lb 2.9 oz)            Gen:  Extremely ill appearing geriatric AA F HEENT: ETT, OG, dry mucous membranes CV: Irregular rate and rhythm  PULM: Mechanical  ventilation ABD: soft/nontender  EXT: No edema  Neuro: Does not awaken or follow commands  SUMMARY OF RECOMMENDATIONS   Partial as is Intubated and on ventilator    Plan to see what MRI reveals --> as of present this has still not been completed   Ongoing Palliative support in the meanwhile  MDM - Discussion regarding de-escalation of care.  Medical Decision Making: 4 #/Complex Problems: 4                     Data Reviewed:    4             Management: 2 (1-Straightforward, 2-Low, 3-Moderate, 4-High) ______________________________________________________________________________________ [place] Team Team Cell Phone: [phone] Please utilize secure chat with additional questions, if there is no response within 30 minutes please call the above phone number  Palliative Medicine Team providers are available by phone from 7am to 7pm daily and can be reached through the team cell phone.  Should this patient require assistance outside of these hours, please call the patient's attending physician.

## 2022-01-07 NOTE — Progress Notes (Signed)
LTM discontinued now at bedside. No skin breakdown noted. Results pending.

## 2022-01-07 NOTE — Progress Notes (Signed)
NAME:  Joan Hunter, MRN:  [id number], DOB:  Nov 06, 1927, LOS: 4 ADMISSION DATE:  01/02/2022, CONSULTATION DATE:  1/19 REFERRING MD:  Kathrynn Humble, CHIEF COMPLAINT:  Out of hospital cardiac arrest   History of Present Illness:  88 y/o female with advanced dementia had an out of hospital cardiac arrest requiring CPR for around 15 minutes.  Initial rhythm was PEA.    Pertinent  Medical History  Allergic rhinitis Arthritis Coronary artery disease Dilated cardiomyopathy with systolic heart failure > LVEF 40-45%, RV function and RVSP normal, mild MR CAD DM2 Lung cancer > managed with curative radiotherapy 2013 Osteopenia Thought to have cryptogenic organizing pneumonia in 2009, formerly followed by Dr. Joya Gaskins  [facility] Events: Including procedures, antibiotic start and stop dates in addition to other pertinent events   1/19 admission  Imaging 1/19 CT head/spine > NAICP, atrophy with chronic microvascular changes, degenerative changes without acute fracture 1/20 echo> LVEF 40-45%, RV function and RVSP normal, mild MR  Procedures 1/19 echo >   Micro: 1/19 sars cov 2 / flu > neg 1/19 urine > insignificant growth 1/19 blood >   Abx 1/19 cefepime > 1/20 1/19 flagyl x1 1/19 vanc x1 1/21 ceftriaxone >   Interim History / Subjective:   On continuous EEG  On pressure support ventilation since this morning For MRI brain today at [place] since yesterday  Objective   Blood pressure 125/76, pulse (!) 124, temperature 99 F (37.2 C), resp. rate 15, height 5\' 4"  (1.626 m), weight 52.7 kg, SpO2 100 %.    Vent Mode: PRVC FiO2 (%):  [30 %-40 %] 30 % Set Rate:  [16 bmp] 16 bmp Vt Set:  [440 mL] 440 mL PEEP:  [5 cmH20] 5 cmH20 Plateau Pressure:  [9 cmH20-15 cmH20] 15 cmH20   Intake/Output Summary (Last 24 hours) at 01/07/2022 0756 Last data filed at 01/07/2022 0600 Gross per 24 hour  Intake 2180.47 ml  Output 515 ml  Net 1665.47 ml   Filed Weights    01/04/22 0000 01/04/22 0427 01/07/22 0439  Weight: 48.3 kg 48.3 kg 52.7 kg    Examination:  General:  In bed on vent HENT: NCAT ETT in place PULM: CTA B, vent supported breathing CV: Irreg irreg, tachy, no mgr GI: BS+, soft, nontender MSK: normal bulk and tone Neuro: will abduct her right great toe spontaneously, but doesn't make any other purposeful or spontaneous movement to voice or touch   Assessment & Plan:  Out of hospital Cardiac arrest Tele Monitor hemodynamics  Afib with RVR Systolic CHF Tele Start metoprolol  Anoxic brain injury MRI Brain today Minimize sedation Frequent neuro exams D/c eeg monitoring  Sepsis due to UTI, no organism specified> improved Ceftriaxone 7 days  Acute respiratory failure with hypoxemia> unable to wean from vent due to anoxic brain injury Full mechanical vent support VAP prevention Daily WUA/SBT  AKI > improving Hypokalemia Monitor BMET and UOP Replace electrolytes as needed  Shock liver > resolving LFT prn  DM2 with hyperglycemia SSI to continue  Failure to thrive Dementia Physical deconditioning Continue tube feeding efforts  Best Practice (right click and "Reselect all SmartList Selections" daily)   Diet/type: tubefeeds DVT prophylaxis: prophylactic heparin  GI prophylaxis: PPI Lines: N/A Foley:  Yes, and it is still needed Code Status:  limited Last date of multidisciplinary goals of care discussion [1/22 with palliative, will update family after MRI today]  Labs   CBC: Recent Labs  Lab 01/04/2022 1918 01/04/2022 1926 01/04/22 0326 01/05/22  0201 01/06/22 0445 01/06/22 2151 01/07/22 0549  WBC 9.7  --  15.9* 15.7* 15.2*  --  15.2*  NEUTROABS 5.9  --   --   --   --   --   --   HGB 11.3*   < > 12.7 11.9* 10.0* 9.9* 9.3*  HCT 39.1   < > 39.5 36.5 31.3* 29.0* 29.0*  MCV 102.9*  --  90.6 89.7 93.2  --  92.9  PLT 152  --  178 137* 126*  --  136*   < > = values in this interval not displayed.    Basic  Metabolic Panel: Recent Labs  Lab 12/18/2021 1918 12/17/2021 1926 12/23/2021 2318 01/04/22 0326 01/04/22 0326 01/04/22 1706 01/05/22 0201 01/05/22 1705 01/06/22 0445 01/06/22 1642 01/06/22 2151  NA 141   < > 141 141  --   --  140  --  141  --  142  K 3.3*   < > 3.2* 4.0  --   --  3.7  --  3.3*  --  4.9  CL 103  --   --  104  --   --  107  --  111  --   --   CO2 11*  --   --  20*  --   --  22  --  21*  --   --   GLUCOSE 359*  --   --  175*  --   --  159*  --  210*  --   --   BUN 44*  --   --  39*  --   --  30*  --  36*  --   --   CREATININE 2.49*  --   --  2.06*  --   --  1.49*  --  1.25*  --   --   CALCIUM 8.6*  --   --  8.7*  --   --  8.2*  --  7.8*  --   --   MG  --   --   --  2.1   < > 2.0 2.0 2.0 2.1 2.0  --   PHOS  --   --   --  4.0   < > 3.3 2.7 2.5 2.0* 1.2*  --    < > = values in this interval not displayed.   GFR: Estimated Creatinine Clearance: 23.9 mL/min (A) (by C-G formula based on SCr of 1.25 mg/dL (H)). Recent Labs  Lab 01/06/2022 1918 12/18/2021 2121 01/04/22 0326 01/05/22 0201 01/06/22 0445 01/07/22 0549  WBC 9.7  --  15.9* 15.7* 15.2* 15.2*  LATICACIDVEN >9.0* >9.0* 6.2*  --  2.9*  --     Liver Function Tests: Recent Labs  Lab 12/19/2021 1918 01/04/22 0326 01/05/22 0201 01/06/22 0445  AST 239* 766* 277* 89*  ALT 179* 442* 286* 166*  ALKPHOS 87 98 85 87  BILITOT 0.8 0.9 0.7 0.3  PROT 6.1* 6.9 6.7 6.1*  ALBUMIN 2.4* 2.7* 2.4* 2.1*   Recent Labs  Lab 01/04/22 0326  LIPASE 45  AMYLASE 291*   No results for input(s): AMMONIA in the last 168 hours.  ABG    Component Value Date/Time   PHART 7.462 (H) 01/06/2022 2151   PCO2ART 33.9 01/06/2022 2151   PO2ART 105 01/06/2022 2151   HCO3 24.3 01/06/2022 2151   TCO2 25 01/06/2022 2151   ACIDBASEDEF 3.0 (H) 12/26/2021 2318   O2SAT 98.0 01/06/2022 2151     Coagulation Profile: Recent  Labs  Lab 01/04/22 0326  INR 1.3*    Cardiac Enzymes: No results for input(s): CKTOTAL, CKMB, CKMBINDEX,  TROPONINI in the last 168 hours.  HbA1C: Hgb A1c MFr Bld  Date/Time Value Ref Range Status  01/04/2022 03:26 AM 6.8 (H) 4.8 - 5.6 % Final    Comment:    (NOTE)         Prediabetes: 5.7 - 6.4         Diabetes: >6.4         Glycemic control for adults with diabetes: <7.0   02/09/2015 07:54 AM 6.6 (H) 4.8 - 5.6 % Final    Comment:    (NOTE)         Pre-diabetes: 5.7 - 6.4         Diabetes: >6.4         Glycemic control for adults with diabetes: <7.0     CBG: Recent Labs  Lab 01/06/22 1153 01/06/22 1529 01/06/22 1945 01/06/22 2315 01/07/22 0323  GLUCAP 183* 139* 164* 165* 161*    Critical care time: 35 minutes     Roselie Awkward, MD [place] [facility] Pager: [phone] Cell: [phone] After 7:00 pm call Elink  [phone]

## 2022-01-07 NOTE — Procedures (Signed)
[name]  MRN: [id number]  Epilepsy Attending: Lora Havens  Referring Physician/Provider: Dr Ulice Brilliant Duration: 01/06/2022 0730 01/07/2022 0730  Patient history: 88 yr old patient, undergoing an EEG to evaluate for cardiac arrest.  Level of alertness: comatose  AEDs during EEG study: None  Technical aspects: This EEG study was done with scalp electrodes positioned according to the 10-20 International system of electrode placement. Electrical activity was acquired at a sampling rate of 500Hz  and reviewed with a high frequency filter of 70Hz  and a low frequency filter of 1Hz . EEG data were recorded continuously and digitally stored.   Description: EEG showed continuous generalized 3 to 6 Hz theta-delta slowing. Generalized periodic discharges were noted at 0.25 2.5 Hz.    Patient event button was pressed on 01/06/2022 at 1929.  Per RN, patient had left arm tremors.  Concomitant EEG before, during and after the event did not show any EEG changes to suggest seizure.  Hyperventilation and photic stimulation were not performed.     ABNORMALITY - Periodic discharges, generalized ( GPDs) - Continuous slow, generalized  IMPRESSION: This study showed evidence of epileptogenicity with generalized onset.  Additionally EEG is suggestive moderate to severe diffuse encephalopathy, nonspecific etiology but could be secondary to anoxic/hypoxic brain injury in the setting of cardiac arrest.  No seizures were seen throughout the recording.  Patient event button was pressed on 01/06/2022 at 1929 for left arm tremors without concomitant EEG change.  This was most likely not an epileptic event.   Bain Whichard Barbra Sarks

## 2022-01-07 NOTE — TOC Progression Note (Signed)
Transition of Care [facility]) - Initial/Assessment Note    Patient Details  Name: SHERREY NORTH MRN: [id number] Date of Birth: 1927/04/17  Transition of [facility]) CM/SW Contact:    Milinda Antis, [place] Phone Number: 01/07/2022, 12:17 PM  Clinical Narrative:                 Family meeting today with palliative after MRI to discuss [place].    Transition of Care Department [facility]) has reviewed patient and no TOC needs have been identified at this time. We will continue to monitor patient advancement through interdisciplinary progression rounds. If new patient transition needs arise, please place a TOC consult.            Patient Goals and CMS Choice        Expected Discharge Plan and Services                                                Prior Living Arrangements/Services                       Activities of Daily Living      Permission Sought/Granted                  Emotional Assessment              Admission diagnosis:  Cardiac arrest [facility]) [S06.3] Metabolic acidosis [K16.01] Patient Active Problem List   Diagnosis Date Noted   Protein-calorie malnutrition, severe 01/06/2022   Cardiac arrest ([place]) 12/22/2021   Dilated cardiomyopathy ([place]) 09/20/2021   Hypokalemia 01/09/2017   Moderate dementia with behavioral disturbance 12/13/2016   History of lung cancer 12/13/2016   Cough 12/27/2015   Dependence on supplemental oxygen 02/09/2015   COPD exacerbation ([place]) 02/09/2015   Mitral regurgitation 11/18/2014   Left anterior fascicular block 11/18/2014   Non-small cell cancer of right lung ([place]) 07/09/2012   CAD (coronary artery disease) 07/09/2012   Diabetes mellitus type 2 in nonobese ([place]) 10/14/2008   HYPERCHOLESTEROLEMIA 10/14/2008   CHF 10/14/2008   PULMONARY FIBROSIS, POSTINFLAMMATORY 10/14/2008   Essential hypertension 10/13/2008   PCP:  [name], MD Pharmacy:   [id number] [place], [place] - [place] [address] [place] [address] Phone: [phone] Fax: [phone]     Social Determinants of Health (SDOH) Interventions    Readmission Risk Interventions No flowsheet data found.

## 2022-01-08 ENCOUNTER — Inpatient Hospital Stay (HOSPITAL_COMMUNITY): Payer: Medicare PPO

## 2022-01-08 DIAGNOSIS — G301 Alzheimer's disease with late onset: Secondary | ICD-10-CM

## 2022-01-08 DIAGNOSIS — Z515 Encounter for palliative care: Secondary | ICD-10-CM | POA: Diagnosis not present

## 2022-01-08 DIAGNOSIS — I469 Cardiac arrest, cause unspecified: Secondary | ICD-10-CM | POA: Diagnosis not present

## 2022-01-08 DIAGNOSIS — E43 Unspecified severe protein-calorie malnutrition: Secondary | ICD-10-CM | POA: Diagnosis not present

## 2022-01-08 DIAGNOSIS — F02B0 Dementia in other diseases classified elsewhere, moderate, without behavioral disturbance, psychotic disturbance, mood disturbance, and anxiety: Secondary | ICD-10-CM

## 2022-01-08 LAB — COMPREHENSIVE METABOLIC PANEL
ALT: 79 U/L — ABNORMAL HIGH (ref 0–44)
AST: 56 U/L — ABNORMAL HIGH (ref 15–41)
Albumin: 1.9 g/dL — ABNORMAL LOW (ref 3.5–5.0)
Alkaline Phosphatase: 64 U/L (ref 38–126)
Anion gap: 8 (ref 5–15)
BUN: 35 mg/dL — ABNORMAL HIGH (ref 8–23)
CO2: 23 mmol/L (ref 22–32)
Calcium: 7.5 mg/dL — ABNORMAL LOW (ref 8.9–10.3)
Chloride: 111 mmol/L (ref 98–111)
Creatinine, Ser: 0.98 mg/dL (ref 0.44–1.00)
GFR, Estimated: 54 mL/min — ABNORMAL LOW (ref >60)
Glucose, Bld: 176 mg/dL — ABNORMAL HIGH (ref 70–99)
Potassium: 4.6 mmol/L (ref 3.5–5.1)
Sodium: 142 mmol/L (ref 135–145)
Total Bilirubin: 0.6 mg/dL (ref 0.3–1.2)
Total Protein: 5.5 g/dL — ABNORMAL LOW (ref 6.5–8.1)

## 2022-01-08 LAB — GLUCOSE, CAPILLARY
Glucose-Capillary: 136 mg/dL — ABNORMAL HIGH (ref 70–99)
Glucose-Capillary: 140 mg/dL — ABNORMAL HIGH (ref 70–99)
Glucose-Capillary: 163 mg/dL — ABNORMAL HIGH (ref 70–99)
Glucose-Capillary: 163 mg/dL — ABNORMAL HIGH (ref 70–99)
Glucose-Capillary: 180 mg/dL — ABNORMAL HIGH (ref 70–99)
Glucose-Capillary: 200 mg/dL — ABNORMAL HIGH (ref 70–99)

## 2022-01-08 LAB — PHOSPHORUS: Phosphorus: 1.9 mg/dL — ABNORMAL LOW (ref 2.5–4.6)

## 2022-01-08 LAB — CBC
HCT: 21.4 % — ABNORMAL LOW (ref 36.0–46.0)
Hemoglobin: 7.1 g/dL — ABNORMAL LOW (ref 12.0–15.0)
MCH: 30.9 pg (ref 26.0–34.0)
MCHC: 33.2 g/dL (ref 30.0–36.0)
MCV: 93 fL (ref 80.0–100.0)
Platelets: 150 10*3/uL (ref 150–400)
RBC: 2.3 MIL/uL — ABNORMAL LOW (ref 3.87–5.11)
RDW: 16.1 % — ABNORMAL HIGH (ref 11.5–15.5)
WBC: 10.8 10*3/uL — ABNORMAL HIGH (ref 4.0–10.5)
nRBC: 1 % — ABNORMAL HIGH (ref 0.0–0.2)

## 2022-01-08 LAB — MAGNESIUM: Magnesium: 2 mg/dL (ref 1.7–2.4)

## 2022-01-08 LAB — CULTURE, BLOOD (ROUTINE X 2)
Culture: NO GROWTH
Culture: NO GROWTH

## 2022-01-08 MED ORDER — SODIUM PHOSPHATES 45 MMOLE/15ML IV SOLN
30.0000 mmol | Freq: Once | INTRAVENOUS | Status: AC
  Administered 2022-01-08: 04:00:00 30 mmol via INTRAVENOUS
  Filled 2022-01-08: qty 10

## 2022-01-08 MED ORDER — METOPROLOL TARTRATE 25 MG/10 ML ORAL SUSPENSION
50.0000 mg | Freq: Two times a day (BID) | ORAL | Status: DC
  Administered 2022-01-08 – 2022-01-17 (×18): 50 mg
  Filled 2022-01-08 (×21): qty 20

## 2022-01-08 NOTE — Progress Notes (Signed)
Patient ID: ALYSSAH ALGEO, female   DOB: 14-Dec-1927, 88 y.o.   MRN: [id number]     Progress Note from the Palliative Medicine Team at [facility]   Patient Name: KASSADI PRESSWOOD        Date: 01/08/2022 DOB: 10/10/1927  Age: 88 y.o. MRN#: [id number] Attending Physician: Juanito Doom, MD Primary Care Physician: [name], MD Admit Date: 01/13/2022   Medical records reviewed   88 year old female with past medical history as below, which is significant for dementia, lung cancer status post curative radiotherapy, coronary artery disease, dilated cardiomyopathy,  hypertension, and diabetes mellitus.    At baseline the patient with moderate to severe dementia   She is only able to minimally assist with activities of daily living and her oral intake is fallen off quite a bit.  She was sleeping more and eating less.  Patient became unresponsive, 911 was called, CPR initiated in the field,CPR for approximately 15 minutes prior to ROSC.   Initial rhythm was PEA.  Patient was intubated.   Today is day 4 of this hospitalization.  Patient remains intubated, does not follow commands, decreased response to painful stimuli.  IMPRESSION:  EEG This study showed evidence of epileptogenicity with generalized onset.  Additionally EEG is suggestive moderate to severe diffuse encephalopathy, nonspecific etiology but could be secondary to anoxic/hypoxic brain injury in the setting of cardiac arrest.  No seizures were seen throughout the recording.  IMPRESSION:   MRI 1. Restricted diffusion in the bilateral perirolandic and occipital cortex, a global anoxic pattern of ischemia. 2. Brain atrophy in keeping with history of dementia.  Initial consult with palliative medicine 01/06/2022.  Family face treatment option decisions, advanced directive decisions and  This NP visited patient at the bedside as a follow up for palliative medicine needs and emotional support.  Met at the bedside today with  patient's daughter [name] and granddaughter Beckie Busing for continued conversation regarding current [place] situation.  Dr. Lake Bells at bedside and updated patient from critical care standpoint.   Education offered on the seriousness of the patient's current medical situation as it relates to significant anoxic brain injury, layered by significant dementia diagnosis.  Education offered on the natural trajectory of the patient with anoxic brain injury specific to ability to demonstrate signs of improvement for meaningful recovery.  Concern for patient's survival with full medical support and then the ongoing overall failure to thrive that accompanies the situation; inability to support oneself from a nutritional standpoint, infection and skin breakdown.  Also need for 24/7 skilled nursing facility care discussed.  Education offered on the difference between aggressive medical intervention path and a palliative comfort path for this patient at this time in this situation.  Education offered on hospice philosophy as it relates to comfort care at end-of-life.  Daughter asks for "time to make decisions, I do not really want to talk about this for a few days"   Discussed with family the importance of continued conversation with each other and the  medical providers regarding overall plan of care and treatment options,  ensuring decisions are within the context of the patients values and GOCs.  Questions and concerns addressed     I verbalized to daughter that I would touch base with her again on Thursday 1-26 23 to further explore next steps in treatment plan for Ms. Darrell Jewel    Discussed with Dr Novella Rob NP  Palliative Medicine Team Team Phone # [phone] Pager [phone]

## 2022-01-08 NOTE — Progress Notes (Signed)
Pt transported to and from MRI on the ventilator. °

## 2022-01-08 NOTE — Progress Notes (Signed)
NAME:  Joan Hunter, MRN:  [id number], DOB:  [birth year], LOS: 5 ADMISSION DATE:  01/08/2022, CONSULTATION DATE:  1/19 REFERRING MD:  Kathrynn Humble, CHIEF COMPLAINT:  Out of hospital cardiac arrest   History of Present Illness:  88 y/o female with advanced dementia had an out of hospital cardiac arrest requiring CPR for around 15 minutes.  Initial rhythm was PEA.    Pertinent  Medical History  Allergic rhinitis Arthritis Coronary artery disease Dilated cardiomyopathy with systolic heart failure > LVEF 40-45%, RV function and RVSP normal, mild MR CAD DM2 Lung cancer > managed with curative radiotherapy 2013 Osteopenia Thought to have cryptogenic organizing pneumonia in 2009, formerly followed by Dr. Joya Gaskins  [facility] Events: Including procedures, antibiotic start and stop dates in addition to other pertinent events   1/19 admission 1/23 started metoprolol  Imaging 1/19 CT head/spine > NAICP, atrophy with chronic microvascular changes, degenerative changes without acute fracture 1/20 echo> LVEF 40-45%, RV function and RVSP normal, mild MR  Procedures 1/19 echo >   Micro: 1/19 sars cov 2 / flu > neg 1/19 urine > insignificant growth 1/19 blood >   Abx 1/19 cefepime > 1/20 1/19 flagyl x1 1/19 vanc x1 1/21 ceftriaxone >   Interim History / Subjective:   No acute events Didn't have her MRI Started metoprolol Low grade fever overnight  Objective   Blood pressure 104/60, pulse (!) 108, temperature 99 F (37.2 C), resp. rate 14, height 5\' 4"  (1.626 m), weight 53.1 kg, SpO2 100 %.    Vent Mode: PSV;CPAP FiO2 (%):  [30 %] 30 % Set Rate:  [16 bmp] 16 bmp Vt Set:  [440 mL] 440 mL PEEP:  [5 cmH20] 5 cmH20 Pressure Support:  [5 cmH20-8 cmH20] 8 cmH20   Intake/Output Summary (Last 24 hours) at 01/08/2022 7078 Last data filed at 01/08/2022 0600 Gross per 24 hour  Intake 1207.11 ml  Output 850 ml  Net 357.11 ml   Filed Weights   01/04/22 0427 01/07/22 0439  01/08/22 0421  Weight: 48.3 kg 52.7 kg 53.1 kg    Examination:  General:  In bed on vent HENT: NCAT ETT in place PULM: CTA B, vent supported breathing CV: RRR, no mgr GI: BS+, soft, nontender MSK: normal bulk and tone Neuro: asleep on vent, has a weak cough to suction in trachea but no motor response or eye opening to voice or painful stimuli    Assessment & Plan:  Out of hospital Cardiac arrest Tele Monitor hemodynamics  Afib with RVR Systolic CHF Tele Continue metoprolol, increase today to 50mg  per tube bid  Anoxic brain injury MRI brain today Minimize sedation Frequent neuro exams D/C EEG monitoring  Sepsis due to UTI, no organism specified> improved Ceftriaxone 7 days  Acute respiratory failure with hypoxemia> unable to wean from vent due to anoxic brain injury Full mechanical vent support VAP prevention Daily WUA/SBT  AKI > improving Monitor BMET and UOP Replace electrolytes as needed  Shock liver > resolving LFT PRN  DM2 with hyperglycemia SSI to continue  Failure to thrive Dementia Physical deconditioning Continue tube feeding efforts  Best Practice (right click and "Reselect all SmartList Selections" daily)   Diet/type: tubefeeds DVT prophylaxis: prophylactic heparin  GI prophylaxis: PPI Lines: N/A Foley:  Yes, and it is still needed Code Status:  limited Last date of multidisciplinary goals of care discussion [1/22 with palliative, will update family after MRI today]  Labs   CBC: Recent Labs  Lab 01/04/2022 1918 01/05/2022 1926  01/04/22 0326 01/05/22 0201 01/06/22 0445 01/06/22 2151 01/07/22 0549 01/08/22 0245  WBC 9.7  --  15.9* 15.7* 15.2*  --  15.2* 10.8*  NEUTROABS 5.9  --   --   --   --   --   --   --   HGB 11.3*   < > 12.7 11.9* 10.0* 9.9* 9.3* 7.1*  HCT 39.1   < > 39.5 36.5 31.3* 29.0* 29.0* 21.4*  MCV 102.9*  --  90.6 89.7 93.2  --  92.9 93.0  PLT 152  --  178 137* 126*  --  136* 150   < > = values in this interval not  displayed.    Basic Metabolic Panel: Recent Labs  Lab 01/04/22 0326 01/04/22 1706 01/05/22 0201 01/05/22 1705 01/06/22 0445 01/06/22 1642 01/06/22 2151 01/07/22 0549 01/07/22 0902 01/08/22 0245  NA 141  --  140  --  141  --  142 139  --  142  K 4.0  --  3.7  --  3.3*  --  4.9 4.7  --  4.6  CL 104  --  107  --  111  --   --  110  --  111  CO2 20*  --  22  --  21*  --   --  16*  --  23  GLUCOSE 175*  --  159*  --  210*  --   --  184*  --  176*  BUN 39*  --  30*  --  36*  --   --  35*  --  35*  CREATININE 2.06*  --  1.49*  --  1.25*  --   --  1.02*  --  0.98  CALCIUM 8.7*  --  8.2*  --  7.8*  --   --  7.8*  --  7.5*  MG 2.1   < > 2.0 2.0 2.1 2.0  --   --  2.1 2.0  PHOS 4.0   < > 2.7 2.5 2.0* 1.2*  --  1.2*  --  1.9*   < > = values in this interval not displayed.   GFR: Estimated Creatinine Clearance: 30.7 mL/min (by C-G formula based on SCr of 0.98 mg/dL). Recent Labs  Lab 01/13/2022 1918 01/06/2022 2121 01/04/22 0326 01/05/22 0201 01/06/22 0445 01/07/22 0549 01/08/22 0245  WBC 9.7  --  15.9* 15.7* 15.2* 15.2* 10.8*  LATICACIDVEN >9.0* >9.0* 6.2*  --  2.9*  --   --     Liver Function Tests: Recent Labs  Lab 01/04/22 0326 01/05/22 0201 01/06/22 0445 01/07/22 0902 01/08/22 0245  AST 766* 277* 89* 61* 56*  ALT 442* 286* 166* 106* 79*  ALKPHOS 98 85 87 71 64  BILITOT 0.9 0.7 0.3 0.3 0.6  PROT 6.9 6.7 6.1* 5.9* 5.5*  ALBUMIN 2.7* 2.4* 2.1* 2.0* 1.9*   Recent Labs  Lab 01/04/22 0326  LIPASE 45  AMYLASE 291*   No results for input(s): AMMONIA in the last 168 hours.  ABG    Component Value Date/Time   PHART 7.462 (H) 01/06/2022 2151   PCO2ART 33.9 01/06/2022 2151   PO2ART 105 01/06/2022 2151   HCO3 24.3 01/06/2022 2151   TCO2 25 01/06/2022 2151   ACIDBASEDEF 3.0 (H) 12/19/2021 2318   O2SAT 98.0 01/06/2022 2151     Coagulation Profile: Recent Labs  Lab 01/04/22 0326  INR 1.3*    Cardiac Enzymes: No results for input(s): CKTOTAL, CKMB, CKMBINDEX,  TROPONINI in the last  168 hours.  HbA1C: Hgb A1c MFr Bld  Date/Time Value Ref Range Status  01/04/2022 03:26 AM 6.8 (H) 4.8 - 5.6 % Final    Comment:    (NOTE)         Prediabetes: 5.7 - 6.4         Diabetes: >6.4         Glycemic control for adults with diabetes: <7.0   02/09/2015 07:54 AM 6.6 (H) 4.8 - 5.6 % Final    Comment:    (NOTE)         Pre-diabetes: 5.7 - 6.4         Diabetes: >6.4         Glycemic control for adults with diabetes: <7.0     CBG: Recent Labs  Lab 01/07/22 1647 01/07/22 1928 01/07/22 2329 01/08/22 0342 01/08/22 0724  GLUCAP 170* 155* 168* 163* 200*    Critical care time: 31 minutes     Roselie Awkward, MD [place] [facility] Pager: [phone] Cell: [phone] After 7:00 pm call Elink  [phone]

## 2022-01-08 NOTE — Progress Notes (Signed)
MRI was not completed due to priority accommodations. MRI will call back when accomodation is available.

## 2022-01-08 NOTE — Progress Notes (Signed)
Phos 1.9 Replaced per protocol

## 2022-01-09 DIAGNOSIS — E43 Unspecified severe protein-calorie malnutrition: Secondary | ICD-10-CM | POA: Diagnosis not present

## 2022-01-09 DIAGNOSIS — I469 Cardiac arrest, cause unspecified: Secondary | ICD-10-CM | POA: Diagnosis not present

## 2022-01-09 LAB — COMPREHENSIVE METABOLIC PANEL
ALT: 67 U/L — ABNORMAL HIGH (ref 0–44)
AST: 57 U/L — ABNORMAL HIGH (ref 15–41)
Albumin: 1.8 g/dL — ABNORMAL LOW (ref 3.5–5.0)
Alkaline Phosphatase: 64 U/L (ref 38–126)
Anion gap: 6 (ref 5–15)
BUN: 34 mg/dL — ABNORMAL HIGH (ref 8–23)
CO2: 26 mmol/L (ref 22–32)
Calcium: 7.6 mg/dL — ABNORMAL LOW (ref 8.9–10.3)
Chloride: 107 mmol/L (ref 98–111)
Creatinine, Ser: 0.92 mg/dL (ref 0.44–1.00)
GFR, Estimated: 58 mL/min — ABNORMAL LOW (ref >60)
Glucose, Bld: 169 mg/dL — ABNORMAL HIGH (ref 70–99)
Potassium: 4.3 mmol/L (ref 3.5–5.1)
Sodium: 139 mmol/L (ref 135–145)
Total Bilirubin: 0.6 mg/dL (ref 0.3–1.2)
Total Protein: 5.4 g/dL — ABNORMAL LOW (ref 6.5–8.1)

## 2022-01-09 LAB — GLUCOSE, CAPILLARY
Glucose-Capillary: 140 mg/dL — ABNORMAL HIGH (ref 70–99)
Glucose-Capillary: 146 mg/dL — ABNORMAL HIGH (ref 70–99)
Glucose-Capillary: 167 mg/dL — ABNORMAL HIGH (ref 70–99)
Glucose-Capillary: 173 mg/dL — ABNORMAL HIGH (ref 70–99)
Glucose-Capillary: 177 mg/dL — ABNORMAL HIGH (ref 70–99)
Glucose-Capillary: 222 mg/dL — ABNORMAL HIGH (ref 70–99)

## 2022-01-09 NOTE — Progress Notes (Signed)
°   01/09/22 1516  °Clinical Encounter Type  °Visited With Health care provider;Family  °Visit Type Follow-up  °Referral From Nurse  °Consult/Referral To Chaplain  °Stress Factors  °Family Stress Factors  °(Hoprful)  ° °Chaplain   paged to 2M06 to meet with patient family who is asking for prayer. Upon arrival I met with nurse who stated that patient is post-cardiac arrest. Patient had been referred to palliative care team, but patient's daughter and granddaughter remain hopeful for recovery. Met with patient's daughter ([name]) and granddaughter (Monica) at patient's bedside. Chaplain asked open ended questions to facilitate emotional expression and story telling. [name] has been living with her mother and primary care giver. [name] stated that all she has are good memories with her mother. [name] shared that her mother, herself and husband have strong Christian faith and active in their [employer] church. [name] said that her faith community and friends have been extremely supportive and praying for her mother.  ° °Chaplain provided supportive and reflective listening as well as prayer in pt's Christian faith tradition at daughter and granddaughter's request. Daughter asked that Chaplain's regularly stop by to provide prayer support.   °

## 2022-01-09 NOTE — Progress Notes (Signed)
[facility] evening rounds  Updated daughter, granddaughter and other family members (who are in nursing) at length today in the ICU conference room.  They voiced understanding of her condition.  They aren't ready to withdraw care today, but they are considering all options.  They do not feel that a tracheostomy would be in her best interest (which I agree with).  Will continue to update them daily.  Roselie Awkward, MD [place] [facility] Pager: [phone] Cell: [phone] After 7:00 pm call Elink  [phone]

## 2022-01-09 NOTE — Progress Notes (Signed)
NAME:  Joan Hunter, MRN:  [id number], DOB:  09-24-1927, LOS: 6 ADMISSION DATE:  01/01/2022, CONSULTATION DATE:  1/19 REFERRING MD:  Kathrynn Humble, CHIEF COMPLAINT:  Out of hospital cardiac arrest   History of Present Illness:  88 y/o female with advanced dementia had an out of hospital cardiac arrest requiring CPR for around 15 minutes.  Initial rhythm was PEA.    Pertinent  Medical History  Allergic rhinitis Arthritis Coronary artery disease Dilated cardiomyopathy with systolic heart failure > LVEF 40-45%, RV function and RVSP normal, mild MR CAD DM2 Lung cancer > managed with curative radiotherapy 2013 Osteopenia Thought to have cryptogenic organizing pneumonia in 2009, formerly followed by Dr. Joya Gaskins  [facility] Events: Including procedures, antibiotic start and stop dates in addition to other pertinent events   1/19 admission 1/23 started metoprolol 1/24 meeting with family: palliative medicine;   Imaging 1/19 CT head/spine > NAICP, atrophy with chronic microvascular changes, degenerative changes without acute fracture 1/20 echo> LVEF 40-45%, RV function and RVSP normal, mild MR 1/23 EEG > moderate to severe diffuse encephalopathy 1/24 MRI Brain > findings consistent with anoxic brain injury  Procedures 1/19 echo >   Micro: 1/19 sars cov 2 / flu > neg 1/19 urine > insignificant growth 1/19 blood >   Abx 1/19 cefepime > 1/20 1/19 flagyl x1 1/19 vanc x1 1/21 ceftriaxone >   Interim History / Subjective:   1/24 meeting with family: palliative medicine;  No acute events On pressure support again this morning  Objective   Blood pressure (!) 89/66, pulse (!) 113, temperature 98.7 F (37.1 C), temperature source Oral, resp. rate 18, height 5\' 4"  (1.626 m), weight 54.6 kg, SpO2 100 %.    Vent Mode: PSV;CPAP FiO2 (%):  [30 %] 30 % Set Rate:  [16 bmp] 16 bmp Vt Set:  [440 mL] 440 mL PEEP:  [5 cmH20] 5 cmH20 Pressure Support:  [8 cmH20] 8  cmH20 Plateau Pressure:  [14 cmH20] 14 cmH20   Intake/Output Summary (Last 24 hours) at 01/09/2022 0729 Last data filed at 01/09/2022 0600 Gross per 24 hour  Intake 1310.32 ml  Output 500 ml  Net 810.32 ml   Filed Weights   01/07/22 0439 01/08/22 0421 01/09/22 0500  Weight: 52.7 kg 53.1 kg 54.6 kg    Examination:  General:  In bed on vent HENT: NCAT ETT in place PULM: CTA B, vent supported breathing CV: RRR, no mgr GI: BS+, soft, nontender MSK: normal bulk and tone Neuro: no response to verbal or tactile stimulous    Assessment & Plan:  Out of hospital Cardiac arrest Tele Monitor hemodynamics  Afib with RVR Systolic CHF Tele Metoprolol 50mg  bid  Anoxic brain injury > seen on MRI brain Minimize sedation Frequent neuro exams  Sepsis due to UTI, no organism specified> improved Ceftriaxone 7 days  Acute respiratory failure with hypoxemia> unable to wean from vent due to anoxic brain injury Full mechanical vent support VAP prevention Daily WUA/SBT  AKI > improving Monitor BMET and UOP Replace electrolytes as needed  Shock liver > resolving LFT PRN  DM2 with hyperglycemia SSI to continue   Failure to thrive Dementia Physical deconditioning Continue tube feeding efforts  Best Practice (right click and "Reselect all SmartList Selections" daily)   Diet/type: tubefeeds DVT prophylaxis: prophylactic heparin  GI prophylaxis: PPI Lines: N/A Foley:  Yes, and it is still needed Code Status:  limited : no CPR, vent OK Last date of multidisciplinary goals of care discussion [1/24  I updated her daughter and granddaughter bedside at length with palliative medicine.  We explained that her prognosis is poor.  They are considering how to proceed.]  Labs   CBC: Recent Labs  Lab 01/05/2022 1918 12/23/2021 1926 01/04/22 0326 01/05/22 0201 01/06/22 0445 01/06/22 2151 01/07/22 0549 01/08/22 0245  WBC 9.7  --  15.9* 15.7* 15.2*  --  15.2* 10.8*  NEUTROABS 5.9  --    --   --   --   --   --   --   HGB 11.3*   < > 12.7 11.9* 10.0* 9.9* 9.3* 7.1*  HCT 39.1   < > 39.5 36.5 31.3* 29.0* 29.0* 21.4*  MCV 102.9*  --  90.6 89.7 93.2  --  92.9 93.0  PLT 152  --  178 137* 126*  --  136* 150   < > = values in this interval not displayed.    Basic Metabolic Panel: Recent Labs  Lab 01/05/22 0201 01/05/22 1705 01/06/22 0445 01/06/22 1642 01/06/22 2151 01/07/22 0549 01/07/22 0902 01/08/22 0245 01/09/22 0130  NA 140  --  141  --  142 139  --  142 139  K 3.7  --  3.3*  --  4.9 4.7  --  4.6 4.3  CL 107  --  111  --   --  110  --  111 107  CO2 22  --  21*  --   --  16*  --  23 26  GLUCOSE 159*  --  210*  --   --  184*  --  176* 169*  BUN 30*  --  36*  --   --  35*  --  35* 34*  CREATININE 1.49*  --  1.25*  --   --  1.02*  --  0.98 0.92  CALCIUM 8.2*  --  7.8*  --   --  7.8*  --  7.5* 7.6*  MG 2.0 2.0 2.1 2.0  --   --  2.1 2.0  --   PHOS 2.7 2.5 2.0* 1.2*  --  1.2*  --  1.9*  --    GFR: Estimated Creatinine Clearance: 33.6 mL/min (by C-G formula based on SCr of 0.92 mg/dL). Recent Labs  Lab 12/31/2021 1918 01/10/2022 2121 01/04/22 0326 01/05/22 0201 01/06/22 0445 01/07/22 0549 01/08/22 0245  WBC 9.7  --  15.9* 15.7* 15.2* 15.2* 10.8*  LATICACIDVEN >9.0* >9.0* 6.2*  --  2.9*  --   --     Liver Function Tests: Recent Labs  Lab 01/05/22 0201 01/06/22 0445 01/07/22 0902 01/08/22 0245 01/09/22 0130  AST 277* 89* 61* 56* 57*  ALT 286* 166* 106* 79* 67*  ALKPHOS 85 87 71 64 64  BILITOT 0.7 0.3 0.3 0.6 0.6  PROT 6.7 6.1* 5.9* 5.5* 5.4*  ALBUMIN 2.4* 2.1* 2.0* 1.9* 1.8*   Recent Labs  Lab 01/04/22 0326  LIPASE 45  AMYLASE 291*   No results for input(s): AMMONIA in the last 168 hours.  ABG    Component Value Date/Time   PHART 7.462 (H) 01/06/2022 2151   PCO2ART 33.9 01/06/2022 2151   PO2ART 105 01/06/2022 2151   HCO3 24.3 01/06/2022 2151   TCO2 25 01/06/2022 2151   ACIDBASEDEF 3.0 (H) 12/23/2021 2318   O2SAT 98.0 01/06/2022 2151      Coagulation Profile: Recent Labs  Lab 01/04/22 0326  INR 1.3*    Cardiac Enzymes: No results for input(s): CKTOTAL, CKMB, CKMBINDEX, TROPONINI in the last 168  hours.  HbA1C: Hgb A1c MFr Bld  Date/Time Value Ref Range Status  01/04/2022 03:26 AM 6.8 (H) 4.8 - 5.6 % Final    Comment:    (NOTE)         Prediabetes: 5.7 - 6.4         Diabetes: >6.4         Glycemic control for adults with diabetes: <7.0   02/09/2015 07:54 AM 6.6 (H) 4.8 - 5.6 % Final    Comment:    (NOTE)         Pre-diabetes: 5.7 - 6.4         Diabetes: >6.4         Glycemic control for adults with diabetes: <7.0     CBG: Recent Labs  Lab 01/08/22 1652 01/08/22 1926 01/08/22 2340 01/09/22 0348 01/09/22 0720  GLUCAP 180* 163* 136* 146* 177*    Critical care time: 35 minutes     Roselie Awkward, MD [place] [facility] Pager: [phone] Cell: [phone] After 7:00 pm call Elink  [phone]

## 2022-01-09 NOTE — Progress Notes (Signed)
Orthopedic Tech Progress Note Patient Details:  Joan Hunter 1927-07-13 [id number]  This morning we tried the SOFT COLLAR it didn't support patient neck, so we tried a PHILLY COLLAR and patient is well supported  Ortho Devices Type of Ortho Device: Soft collar, [place] cervical collar Ortho Device/Splint Location: NECK Ortho Device/Splint Interventions: Ordered, Application, Adjustment, Removal   Post Interventions Patient Tolerated: Well Instructions Provided: Care of device  Janit Pagan 01/09/2022, 12:49 PM

## 2022-01-10 ENCOUNTER — Inpatient Hospital Stay (HOSPITAL_COMMUNITY): Payer: Medicare PPO

## 2022-01-10 DIAGNOSIS — I469 Cardiac arrest, cause unspecified: Secondary | ICD-10-CM | POA: Diagnosis not present

## 2022-01-10 LAB — GLUCOSE, CAPILLARY
Glucose-Capillary: 135 mg/dL — ABNORMAL HIGH (ref 70–99)
Glucose-Capillary: 148 mg/dL — ABNORMAL HIGH (ref 70–99)
Glucose-Capillary: 170 mg/dL — ABNORMAL HIGH (ref 70–99)
Glucose-Capillary: 175 mg/dL — ABNORMAL HIGH (ref 70–99)
Glucose-Capillary: 180 mg/dL — ABNORMAL HIGH (ref 70–99)
Glucose-Capillary: 227 mg/dL — ABNORMAL HIGH (ref 70–99)

## 2022-01-10 NOTE — Progress Notes (Addendum)
Nutrition Follow-up  DOCUMENTATION CODES:   Underweight, Severe malnutrition in context of chronic illness  INTERVENTION:   Continue tube feeds via OG tube: - Vital AF 1.2 @ 50 ml/hr (1200 ml/day)  Tube feeding regimen provides 1440 kcal, 90 grams of protein, and 973 ml of H2O.  NUTRITION DIAGNOSIS:   Severe Malnutrition related to chronic illness (dementia, CHF) as evidenced by severe fat depletion, severe muscle depletion.  Ongoing, being addressed via TF  GOAL:   Patient will meet greater than or equal to 90% of their needs  Met via TF  MONITOR:   Vent status, Labs, Weight trends, TF tolerance  REASON FOR ASSESSMENT:   Ventilator, Consult Enteral/tube feeding initiation and management  ASSESSMENT:   88 year old female who presented to the ED on 1/19 after a witnessed cardiac arrest at home. Pt required intubation. PMH of dementia, lung cancer s/p curative radiotherapy, CAD, HTN, T2DM, CHF, HLD. Pt admitted with PEA cardiac arrest, possible sepsis, AKI from ATN secondary to shock.  Discussed pt with RN and during ICU rounds. Pt with anoxic brain injury. Ongoing [place] discussions. Palliative Medicine involved.  Admit weight: 50 kg Current weight: 55.4 kg  Pt with moderate pitting edema to RUE and deep pitting edema to LUE.  Patient remains intubated on ventilator support MV: 4.6 L/min Temp (24hrs), Avg:98.5 F (36.9 C), Min:97.7 F (36.5 C), Max:99.5 F (37.5 C)  Medications reviewed and include: colace, SSI q 4 hours, IV protonix, miralax  Labs reviewed: BUN 34, phosphorus 1.9 on 1/24, elevated LFTs, hemoglobin 7.1, WBC 10.8 CBG's: 140-222 x 24 hours  I/O's: +7.5 L since admit  Diet Order:   Diet Order             Diet NPO time specified  Diet effective now                   EDUCATION NEEDS:   Not appropriate for education at this time  Skin:  Skin Assessment: Reviewed RN Assessment  Last BM:  01/10/22 smear  Height:   Ht Readings from  Last 1 Encounters:  01/04/22 5' 4"  (1.626 m)    Weight:   Wt Readings from Last 1 Encounters:  01/10/22 55.4 kg    BMI:  Body mass index is 20.96 kg/m.  Estimated Nutritional Needs:   Kcal:  1400-1600  Protein:  70-85 grams  Fluid:  1.4-1.6 L    Gustavus Bryant, MS, RD, LDN Inpatient Clinical Dietitian Please see AMiON for contact information.

## 2022-01-10 NOTE — Progress Notes (Signed)
ETT advanced to 24cm per MD order.

## 2022-01-10 NOTE — Progress Notes (Signed)
Patient ID: [name], female   DOB: 01-16-1927, 88 y.o.   MRN: [id number]     Progress Note from the Palliative Medicine Team at [facility]   [name]        Date: 01/10/2022 DOB: 1927-04-09  Age: 88 y.o. MRN#: [id number] Attending Physician: Juanito Doom, MD Primary Care Physician: [name], MD Admit Date: 01/07/2022   Medical records reviewed   88 year old female with past medical history as below, which is significant for dementia, lung cancer status post curative radiotherapy, coronary artery disease, dilated cardiomyopathy,  hypertension, and diabetes mellitus.    At baseline the patient with moderate to severe dementia   She is only able to minimally assist with activities of daily living and her oral intake is fallen off quite a bit.  She was sleeping more and eating less.  Patient became unresponsive, 911 was called, CPR initiated in the field,CPR for approximately 15 minutes prior to ROSC.   Initial rhythm was PEA.  Patient was intubated.   Today is day 4 of this hospitalization.  Patient remains intubated, does not follow commands, decreased response to painful stimuli.  IMPRESSION:  EEG This study showed evidence of epileptogenicity with generalized onset.  Additionally EEG is suggestive moderate to severe diffuse encephalopathy, nonspecific etiology but could be secondary to anoxic/hypoxic brain injury in the setting of cardiac arrest.  No seizures were seen throughout the recording.  IMPRESSION:   MRI 1. Restricted diffusion in the bilateral perirolandic and occipital cortex, a global anoxic pattern of ischemia. 2. Brain atrophy in keeping with history of dementia.  Initial consult with palliative medicine 01/06/2022.  Family face treatment option decisions, advanced directive decisions and  This NP visited patient at the bedside as a follow up for palliative medicine needs and emotional support.  Patient remains intubated, with  cor-trach for nutritional support, unable to follow commands, minimal response to painful stimuli.    Spoke to daughter by telephone.  She tells me she understands the situation but will not be making any decisions this wee, she "needs more time"  Reinforced with family the importance of continued conversation with each other and the  medical providers regarding overall plan of care and treatment options,  ensuring decisions are within the context of the patients values and GOCs.  Questions and concerns addressed       This NP will f/u next week with family for ongoing conversation.   Discussed with Dr Novella Rob NP  Palliative Medicine Team Team Phone # [phone] Pager [phone]

## 2022-01-10 NOTE — Progress Notes (Signed)
NAME:  Joan Hunter, MRN:  [id number], DOB:  Nov 04, 1927, LOS: 7 ADMISSION DATE:  01/05/2022, CONSULTATION DATE:  1/19 REFERRING MD:  Kathrynn Humble, CHIEF COMPLAINT:  Out of hospital cardiac arrest   History of Present Illness:  88 y/o female with advanced dementia had an out of hospital cardiac arrest requiring CPR for around 15 minutes.  Initial rhythm was PEA.    Pertinent  Medical History  Allergic rhinitis Arthritis Coronary artery disease Dilated cardiomyopathy with systolic heart failure > LVEF 40-45%, RV function and RVSP normal, mild MR CAD DM2 Lung cancer > managed with curative radiotherapy 2013 Osteopenia Thought to have cryptogenic organizing pneumonia in 2009, formerly followed by Dr. Joya Gaskins  [facility] Events: Including procedures, antibiotic start and stop dates in addition to other pertinent events   1/19 admission 1/23 started metoprolol 1/24 meeting with family: palliative medicine;   Imaging 1/19 CT head/spine > NAICP, atrophy with chronic microvascular changes, degenerative changes without acute fracture 1/20 echo> LVEF 40-45%, RV function and RVSP normal, mild MR 1/23 EEG > moderate to severe diffuse encephalopathy 1/24 MRI Brain > findings consistent with anoxic brain injury  Procedures 1/19 echo >   Micro: 1/19 sars cov 2 / flu > neg 1/19 urine > insignificant growth 1/19 blood >   Abx 1/19 cefepime > 1/20 1/19 flagyl x1 1/19 vanc x1 1/21 ceftriaxone >   Interim History / Subjective:   No acute events Moving feet a bit more No eye opening on exam  Objective   Blood pressure 113/75, pulse (!) 122, temperature 97.7 F (36.5 C), temperature source Oral, resp. rate (!) 21, height 5\' 4"  (1.626 m), weight 55.4 kg, SpO2 100 %.    Vent Mode: PSV;CPAP FiO2 (%):  [30 %] 30 % Set Rate:  [16 bmp] 16 bmp Vt Set:  [440 mL] 440 mL PEEP:  [5 cmH20] 5 cmH20 Pressure Support:  [8 cmH20] 8 cmH20   Intake/Output Summary (Last 24 hours) at  01/10/2022 0736 Last data filed at 01/10/2022 0400 Gross per 24 hour  Intake 1200 ml  Output 150 ml  Net 1050 ml   Filed Weights   01/08/22 0421 01/09/22 0500 01/10/22 0500  Weight: 53.1 kg 54.6 kg 55.4 kg    Examination:  General:  In bed on vent HENT: NCAT ETT in place PULM: CTA B, vent supported breathing CV: RRR, no mgr GI: BS+, soft, nontender MSK: normal bulk and tone Neuro: sedated on vent   Assessment & Plan:  Out of hospital Cardiac arrest Tele Monitor hemodynamics  Afib with RVR Systolic CHF Tele Metoprolol 50mg  bid  Anoxic brain injury > seen on MRI brain Minimize sedation Frequent neuro exams  Sepsis due to UTI, no organism specified> improved Ceftriaxone 7 days  Acute respiratory failure with hypoxemia> unable to wean from vent due to anoxic brain injury Continue pressure support ventilation VAP prvention Adjust ETT today We cannot extubate her without expecting her to   AKI> resolved Monitor BMET and UOP Replace electrolytes as needed  Shock liver > resolving LFT prn  DM2 with hyperglycemia SSI to continue  Failure to thrive Dementia Physical deconditioning Continue tube feeding efforts  Best Practice (right click and "Reselect all SmartList Selections" daily)   Diet/type: tubefeeds DVT prophylaxis: prophylactic heparin  GI prophylaxis: PPI Lines: N/A Foley:  Yes, and it is still needed Code Status:  limited : no CPR, vent OK Last date of multidisciplinary goals of care discussion [1/25 updated his family again at length in  the afternoon, see that note.  They are contemplating withdrawal of care, but need time to make this decision.]  Labs   CBC: Recent Labs  Lab 01/13/2022 1918 12/29/2021 1926 01/04/22 0326 01/05/22 0201 01/06/22 0445 01/06/22 2151 01/07/22 0549 01/08/22 0245  WBC 9.7  --  15.9* 15.7* 15.2*  --  15.2* 10.8*  NEUTROABS 5.9  --   --   --   --   --   --   --   HGB 11.3*   < > 12.7 11.9* 10.0* 9.9* 9.3* 7.1*   HCT 39.1   < > 39.5 36.5 31.3* 29.0* 29.0* 21.4*  MCV 102.9*  --  90.6 89.7 93.2  --  92.9 93.0  PLT 152  --  178 137* 126*  --  136* 150   < > = values in this interval not displayed.    Basic Metabolic Panel: Recent Labs  Lab 01/05/22 0201 01/05/22 1705 01/06/22 0445 01/06/22 1642 01/06/22 2151 01/07/22 0549 01/07/22 0902 01/08/22 0245 01/09/22 0130  NA 140  --  141  --  142 139  --  142 139  K 3.7  --  3.3*  --  4.9 4.7  --  4.6 4.3  CL 107  --  111  --   --  110  --  111 107  CO2 22  --  21*  --   --  16*  --  23 26  GLUCOSE 159*  --  210*  --   --  184*  --  176* 169*  BUN 30*  --  36*  --   --  35*  --  35* 34*  CREATININE 1.49*  --  1.25*  --   --  1.02*  --  0.98 0.92  CALCIUM 8.2*  --  7.8*  --   --  7.8*  --  7.5* 7.6*  MG 2.0 2.0 2.1 2.0  --   --  2.1 2.0  --   PHOS 2.7 2.5 2.0* 1.2*  --  1.2*  --  1.9*  --    GFR: Estimated Creatinine Clearance: 33.7 mL/min (by C-G formula based on SCr of 0.92 mg/dL). Recent Labs  Lab 12/26/2021 1918 12/23/2021 2121 01/04/22 0326 01/05/22 0201 01/06/22 0445 01/07/22 0549 01/08/22 0245  WBC 9.7  --  15.9* 15.7* 15.2* 15.2* 10.8*  LATICACIDVEN >9.0* >9.0* 6.2*  --  2.9*  --   --     Liver Function Tests: Recent Labs  Lab 01/05/22 0201 01/06/22 0445 01/07/22 0902 01/08/22 0245 01/09/22 0130  AST 277* 89* 61* 56* 57*  ALT 286* 166* 106* 79* 67*  ALKPHOS 85 87 71 64 64  BILITOT 0.7 0.3 0.3 0.6 0.6  PROT 6.7 6.1* 5.9* 5.5* 5.4*  ALBUMIN 2.4* 2.1* 2.0* 1.9* 1.8*   Recent Labs  Lab 01/04/22 0326  LIPASE 45  AMYLASE 291*   No results for input(s): AMMONIA in the last 168 hours.  ABG    Component Value Date/Time   PHART 7.462 (H) 01/06/2022 2151   PCO2ART 33.9 01/06/2022 2151   PO2ART 105 01/06/2022 2151   HCO3 24.3 01/06/2022 2151   TCO2 25 01/06/2022 2151   ACIDBASEDEF 3.0 (H) 01/13/2022 2318   O2SAT 98.0 01/06/2022 2151     Coagulation Profile: Recent Labs  Lab 01/04/22 0326  INR 1.3*    Cardiac  Enzymes: No results for input(s): CKTOTAL, CKMB, CKMBINDEX, TROPONINI in the last 168 hours.  HbA1C: Hgb A1c MFr Bld  Date/Time Value Ref Range Status  01/04/2022 03:26 AM 6.8 (H) 4.8 - 5.6 % Final    Comment:    (NOTE)         Prediabetes: 5.7 - 6.4         Diabetes: >6.4         Glycemic control for adults with diabetes: <7.0   02/09/2015 07:54 AM 6.6 (H) 4.8 - 5.6 % Final    Comment:    (NOTE)         Pre-diabetes: 5.7 - 6.4         Diabetes: >6.4         Glycemic control for adults with diabetes: <7.0     CBG: Recent Labs  Lab 01/09/22 1523 01/09/22 1933 01/09/22 2343 01/10/22 0335 01/10/22 0722  GLUCAP 140* 167* 173* 148* 175*    Critical care time: 30 minutes     Roselie Awkward, MD [facility] Pager: [phone] Cell: [phone] After 7:00 pm call Elink  [phone]

## 2022-01-11 DIAGNOSIS — G931 Anoxic brain damage, not elsewhere classified: Secondary | ICD-10-CM | POA: Diagnosis not present

## 2022-01-11 DIAGNOSIS — E43 Unspecified severe protein-calorie malnutrition: Secondary | ICD-10-CM | POA: Diagnosis not present

## 2022-01-11 DIAGNOSIS — I469 Cardiac arrest, cause unspecified: Secondary | ICD-10-CM | POA: Diagnosis not present

## 2022-01-11 LAB — GLUCOSE, CAPILLARY
Glucose-Capillary: 149 mg/dL — ABNORMAL HIGH (ref 70–99)
Glucose-Capillary: 155 mg/dL — ABNORMAL HIGH (ref 70–99)
Glucose-Capillary: 160 mg/dL — ABNORMAL HIGH (ref 70–99)
Glucose-Capillary: 163 mg/dL — ABNORMAL HIGH (ref 70–99)
Glucose-Capillary: 175 mg/dL — ABNORMAL HIGH (ref 70–99)
Glucose-Capillary: 211 mg/dL — ABNORMAL HIGH (ref 70–99)

## 2022-01-11 LAB — CBC WITH DIFFERENTIAL/PLATELET
Abs Immature Granulocytes: 0.41 10*3/uL — ABNORMAL HIGH (ref 0.00–0.07)
Basophils Absolute: 0 10*3/uL (ref 0.0–0.1)
Basophils Relative: 0 %
Eosinophils Absolute: 0.3 10*3/uL (ref 0.0–0.5)
Eosinophils Relative: 3 %
HCT: 18.5 % — ABNORMAL LOW (ref 36.0–46.0)
Hemoglobin: 5.7 g/dL — CL (ref 12.0–15.0)
Immature Granulocytes: 4 %
Lymphocytes Relative: 10 %
Lymphs Abs: 1.1 10*3/uL (ref 0.7–4.0)
MCH: 30.2 pg (ref 26.0–34.0)
MCHC: 30.8 g/dL (ref 30.0–36.0)
MCV: 97.9 fL (ref 80.0–100.0)
Monocytes Absolute: 1.1 10*3/uL — ABNORMAL HIGH (ref 0.1–1.0)
Monocytes Relative: 10 %
Neutro Abs: 7.9 10*3/uL — ABNORMAL HIGH (ref 1.7–7.7)
Neutrophils Relative %: 73 %
Platelets: 243 10*3/uL (ref 150–400)
RBC: 1.89 MIL/uL — ABNORMAL LOW (ref 3.87–5.11)
RDW: 17.9 % — ABNORMAL HIGH (ref 11.5–15.5)
WBC: 10.8 10*3/uL — ABNORMAL HIGH (ref 4.0–10.5)
nRBC: 1 % — ABNORMAL HIGH (ref 0.0–0.2)

## 2022-01-11 LAB — BASIC METABOLIC PANEL
Anion gap: 8 (ref 5–15)
BUN: 50 mg/dL — ABNORMAL HIGH (ref 8–23)
CO2: 26 mmol/L (ref 22–32)
Calcium: 7.8 mg/dL — ABNORMAL LOW (ref 8.9–10.3)
Chloride: 109 mmol/L (ref 98–111)
Creatinine, Ser: 0.89 mg/dL (ref 0.44–1.00)
GFR, Estimated: >60 mL/min (ref >60)
Glucose, Bld: 152 mg/dL — ABNORMAL HIGH (ref 70–99)
Potassium: 4 mmol/L (ref 3.5–5.1)
Sodium: 143 mmol/L (ref 135–145)

## 2022-01-11 LAB — PREPARE RBC (CROSSMATCH)

## 2022-01-11 MED ORDER — SODIUM CHLORIDE 0.9% IV SOLUTION: Freq: Once | INTRAVENOUS | Status: AC

## 2022-01-11 MED ORDER — PANTOPRAZOLE 2 MG/ML SUSPENSION
40.0000 mg | Freq: Every day | ORAL | Status: DC
  Administered 2022-01-11 – 2022-01-20 (×10): 40 mg
  Filled 2022-01-11 (×9): qty 20

## 2022-01-11 NOTE — Progress Notes (Signed)
Sputum sample obtained and sent to lab by RT. 

## 2022-01-11 NOTE — Progress Notes (Addendum)
NAME:  Joan Hunter, MRN:  [id number], DOB:  11/21/1927, LOS: 8 ADMISSION DATE:  01/08/2022, CONSULTATION DATE:  1/19 REFERRING MD:  Kathrynn Humble, CHIEF COMPLAINT:  Out of hospital cardiac arrest   History of Present Illness:  88 y/o female with advanced dementia had an out of hospital cardiac arrest requiring CPR for around 15 minutes.  Initial rhythm was PEA.    Pertinent  Medical History  Allergic rhinitis Arthritis Coronary artery disease Dilated cardiomyopathy with systolic heart failure > LVEF 40-45%, RV function and RVSP normal, mild MR CAD DM2 Lung cancer > managed with curative radiotherapy 2013 Osteopenia Thought to have cryptogenic organizing pneumonia in 2009, formerly followed by Dr. Joya Gaskins  [facility] Events: Including procedures, antibiotic start and stop dates in addition to other pertinent events   1/19 admission 1/23 started metoprolol 1/24 meeting with family: palliative medicine;   Imaging 1/19 CT head/spine > NAICP, atrophy with chronic microvascular changes, degenerative changes without acute fracture 1/20 echo> LVEF 40-45%, RV function and RVSP normal, mild MR 1/23 EEG > moderate to severe diffuse encephalopathy 1/24 MRI Brain > findings consistent with anoxic brain injury  Procedures 1/19 echo >   Micro: 1/19 sars cov 2 / flu > neg 1/19 urine > insignificant growth 1/19 blood > ng  Abx 1/19 cefepime > 1/20 1/19 flagyl x1 1/19 vanc x1 1/21 ceftriaxone >   Interim History / Subjective:   Remains critically ill, on vent. Low-grade febrile overnight Good urine output  Objective   Blood pressure 128/78, pulse (!) 106, temperature 97.9 F (36.6 C), temperature source Oral, resp. rate 18, height 5\' 4"  (1.626 m), weight 54.3 kg, SpO2 100 %.    Vent Mode: PSV;CPAP FiO2 (%):  [30 %] 30 % PEEP:  [5 cmH20] 5 cmH20 Pressure Support:  [8 cmH20] 8 cmH20 Plateau Pressure:  [16 cmH20] 16 cmH20   Intake/Output Summary (Last 24 hours) at  01/11/2022 9417 Last data filed at 01/11/2022 0600 Gross per 24 hour  Intake 1170 ml  Output 250 ml  Net 920 ml    Filed Weights   01/09/22 0500 01/10/22 0500 01/11/22 0319  Weight: 54.6 kg 55.4 kg 54.3 kg    Examination:  General: Acutely ill, elderly, frail woman, intubated, no distress HENT: NCAT ETT in place PULM: No accessory muscle use, bilateral clear breath sounds, no rhonchi CV: RRR, no mgr GI: BS+, soft, nontender MSK: normal bulk and tone Neuro: Unresponsive, downward gaze, spontaneous twitching of right leg and chewing movements of lip, no facial twitching  Chest x-ray 1/26 shows right suprahilar opacity, hyperinflation, rotated film.  Labs show normal electrolytes   Assessment & Plan:  Out of hospital Cardiac arrest Tele Monitor hemodynamics  Afib with RVR Systolic CHF Tele Metoprolol 50mg  bid  Anoxic brain injury > seen on MRI brain On no sedation Neuro status has plateaued, unfortunately poor prognosis for meaningful recovery  Sepsis due to UTI, no organism specified> improved Completed ceftriaxone 7 days Now has low-grade fever, will check WBC and respiratory culture  Acute respiratory failure with hypoxemia> unable to wean from vent due to anoxic brain injury Tolerates spontaneous breathing but mental status precludes extubation VAP prvention  Anemia of critical illness Hemoglobin dropped from 7.1-5.7, no evidence of blood loss. Transfuse 1 unit PRBC and recheck  DM2 with hyperglycemia SSI to continue  Failure to thrive Dementia Physical deconditioning Continue tube feeding efforts  Resolved problems AKI> Shock liver   Summary -unfortunately neuro improvement has plateaued, doubt that she  will ever return to baseline function, poor prognosis for meaningful neurologic recovery, continue conversations with family regarding goals of care,  Best Practice (right click and "Reselect all SmartList Selections" daily)   Diet/type:  tubefeeds DVT prophylaxis: prophylactic heparin  GI prophylaxis: PPI Lines: N/A Foley:  Yes, and it is still needed Code Status:  limited : no CPR, vent OK Last date of multidisciplinary goals of care discussion [1/25 family contemplating withdrawal of care, but need time to make this decision.]  Labs   CBC: Recent Labs  Lab 01/05/22 0201 01/06/22 0445 01/06/22 2151 01/07/22 0549 01/08/22 0245  WBC 15.7* 15.2*  --  15.2* 10.8*  HGB 11.9* 10.0* 9.9* 9.3* 7.1*  HCT 36.5 31.3* 29.0* 29.0* 21.4*  MCV 89.7 93.2  --  92.9 93.0  PLT 137* 126*  --  136* 150     Basic Metabolic Panel: Recent Labs  Lab 01/05/22 1705 01/06/22 0445 01/06/22 1642 01/06/22 2151 01/07/22 0549 01/07/22 0902 01/08/22 0245 01/09/22 0130 01/11/22 0426  NA  --  141  --  142 139  --  142 139 143  K  --  3.3*  --  4.9 4.7  --  4.6 4.3 4.0  CL  --  111  --   --  110  --  111 107 109  CO2  --  21*  --   --  16*  --  23 26 26   GLUCOSE  --  210*  --   --  184*  --  176* 169* 152*  BUN  --  36*  --   --  35*  --  35* 34* 50*  CREATININE  --  1.25*  --   --  1.02*  --  0.98 0.92 0.89  CALCIUM  --  7.8*  --   --  7.8*  --  7.5* 7.6* 7.8*  MG 2.0 2.1 2.0  --   --  2.1 2.0  --   --   PHOS 2.5 2.0* 1.2*  --  1.2*  --  1.9*  --   --     GFR: Estimated Creatinine Clearance: 34.6 mL/min (by C-G formula based on SCr of 0.89 mg/dL). Recent Labs  Lab 01/05/22 0201 01/06/22 0445 01/07/22 0549 01/08/22 0245  WBC 15.7* 15.2* 15.2* 10.8*  LATICACIDVEN  --  2.9*  --   --      Liver Function Tests: Recent Labs  Lab 01/05/22 0201 01/06/22 0445 01/07/22 0902 01/08/22 0245 01/09/22 0130  AST 277* 89* 61* 56* 57*  ALT 286* 166* 106* 79* 67*  ALKPHOS 85 87 71 64 64  BILITOT 0.7 0.3 0.3 0.6 0.6  PROT 6.7 6.1* 5.9* 5.5* 5.4*  ALBUMIN 2.4* 2.1* 2.0* 1.9* 1.8*    No results for input(s): LIPASE, AMYLASE in the last 168 hours.  No results for input(s): AMMONIA in the last 168 hours.  ABG    Component  Value Date/Time   PHART 7.462 (H) 01/06/2022 2151   PCO2ART 33.9 01/06/2022 2151   PO2ART 105 01/06/2022 2151   HCO3 24.3 01/06/2022 2151   TCO2 25 01/06/2022 2151   ACIDBASEDEF 3.0 (H) 01/02/2022 2318   O2SAT 98.0 01/06/2022 2151      Coagulation Profile: No results for input(s): INR, PROTIME in the last 168 hours.   Cardiac Enzymes: No results for input(s): CKTOTAL, CKMB, CKMBINDEX, TROPONINI in the last 168 hours.  HbA1C: Hgb A1c MFr Bld  Date/Time Value Ref Range Status  01/04/2022 03:26 AM 6.8 (  H) 4.8 - 5.6 % Final    Comment:    (NOTE)         Prediabetes: 5.7 - 6.4         Diabetes: >6.4         Glycemic control for adults with diabetes: <7.0   02/09/2015 07:54 AM 6.6 (H) 4.8 - 5.6 % Final    Comment:    (NOTE)         Pre-diabetes: 5.7 - 6.4         Diabetes: >6.4         Glycemic control for adults with diabetes: <7.0     CBG: Recent Labs  Lab 01/10/22 1523 01/10/22 1932 01/10/22 2335 01/11/22 0314 01/11/22 0728  GLUCAP 135* 180* 170* 155* 163*     Critical care time: 31 minutes     Kara Mead MD. FCCP. [facility] Pager : [phone]  If no response to pager , please call [phone] until 7 pm After 7:00 pm call Elink  [phone]   01/11/2022

## 2022-01-11 NOTE — TOC Progression Note (Signed)
Transition of Care [facility]) - Initial/Assessment Note    Patient Details  Name: TAJUANA KNISKERN MRN: [id number] Date of Birth: March 21, 1927  [facility] of [facility]) CM/SW Contact:    Milinda Antis, [place] Phone Number: 01/11/2022, 4:14 PM  Clinical Narrative:                 CSW reviewed patient's chart.  Patient remains on the ventilator.  MRI Brain on 1/24 showed findings consistent with anoxic brain injury.  Palliative following.  Pending family's decision on plan of care for patient.       Patient Goals and CMS Choice        Expected Discharge Plan and Services                                                Prior Living Arrangements/Services                       Activities of Daily Living      Permission Sought/Granted                  Emotional Assessment              Admission diagnosis:  Cardiac arrest ([place]) [M21.1] Metabolic acidosis [Z73.56] Patient Active Problem List   Diagnosis Date Noted   Protein-calorie malnutrition, severe 01/06/2022   Cardiac arrest ([place]) 01/08/2022   Dilated cardiomyopathy ([place]) 09/20/2021   Hypokalemia 01/09/2017   Moderate dementia with behavioral disturbance 12/13/2016   History of lung cancer 12/13/2016   Cough 12/27/2015   Dependence on supplemental oxygen 02/09/2015   COPD exacerbation ([place]) 02/09/2015   Mitral regurgitation 11/18/2014   Left anterior fascicular block 11/18/2014   Non-small cell cancer of right lung ([place]) 07/09/2012   CAD (coronary artery disease) 07/09/2012   Diabetes mellitus type 2 in nonobese ([place]) 10/14/2008   HYPERCHOLESTEROLEMIA 10/14/2008   CHF 10/14/2008   PULMONARY FIBROSIS, POSTINFLAMMATORY 10/14/2008   Essential hypertension 10/13/2008   PCP:  [name], MD Pharmacy:   [id number] - [place], [place] [address] [place] [address] Phone: [phone] Fax:  [phone]     Social Determinants of Health (SDOH) Interventions    Readmission Risk Interventions Readmission Risk Prevention Plan 01/08/2022  Transportation Screening Complete  PCP or Specialist Appt within 3-5 Days Not Complete  Not Complete comments Patient still in ICU  [place] or [place] Not Complete  HRI or Home Care Consult comments Waiting for [place] meeting to determine next steps  Social Work Consult for [place] Planning/Counseling Complete  Palliative Care Screening Complete  Medication Review (RN Care Manager) Referral to Pharmacy  Some recent data might be hidden

## 2022-01-11 NOTE — Progress Notes (Signed)
Critical Lab Value  Hgb 5.7 Received by Wannetta Sender, RN at [phone] on 01-11-2022 Dr. Elsworth Soho made aware at [place] on 01-11-2022 MD ordered one unit PRBCs.

## 2022-01-11 NOTE — Plan of Care (Signed)
°  Problem: Clinical Measurements: Goal: Respiratory complications will improve Outcome: Progressing Note: Pt is currently on CPAP mode on ventilator and tolerating well   Problem: Nutrition: Goal: Adequate nutrition will be maintained Outcome: Progressing Note: Pt is tolerating tube feeds well at goal.    Problem: Elimination: Goal: Will not experience complications related to bowel motility Outcome: Progressing Goal: Will not experience complications related to urinary retention Outcome: Progressing   Problem: Activity: Goal: Risk for activity intolerance will decrease Outcome: Not Progressing Note: Pt is minimally responsive and on ventilator. Unable to mobilize at this time.   Problem: Skin Integrity: Goal: Risk for impaired skin integrity will decrease Outcome: Not Progressing Note: DTI found on sacrum overnight.

## 2022-01-12 ENCOUNTER — Inpatient Hospital Stay (HOSPITAL_COMMUNITY): Payer: Medicare PPO

## 2022-01-12 DIAGNOSIS — I469 Cardiac arrest, cause unspecified: Secondary | ICD-10-CM | POA: Diagnosis not present

## 2022-01-12 DIAGNOSIS — G931 Anoxic brain damage, not elsewhere classified: Secondary | ICD-10-CM | POA: Diagnosis not present

## 2022-01-12 LAB — CBC
HCT: 23.1 % — ABNORMAL LOW (ref 36.0–46.0)
Hemoglobin: 7.3 g/dL — ABNORMAL LOW (ref 12.0–15.0)
MCH: 30.2 pg (ref 26.0–34.0)
MCHC: 31.6 g/dL (ref 30.0–36.0)
MCV: 95.5 fL (ref 80.0–100.0)
Platelets: 265 10*3/uL (ref 150–400)
RBC: 2.42 MIL/uL — ABNORMAL LOW (ref 3.87–5.11)
RDW: 18.5 % — ABNORMAL HIGH (ref 11.5–15.5)
WBC: 11.2 10*3/uL — ABNORMAL HIGH (ref 4.0–10.5)
nRBC: 1.1 % — ABNORMAL HIGH (ref 0.0–0.2)

## 2022-01-12 LAB — GLUCOSE, CAPILLARY
Glucose-Capillary: 130 mg/dL — ABNORMAL HIGH (ref 70–99)
Glucose-Capillary: 168 mg/dL — ABNORMAL HIGH (ref 70–99)
Glucose-Capillary: 187 mg/dL — ABNORMAL HIGH (ref 70–99)
Glucose-Capillary: 155 mg/dL — ABNORMAL HIGH (ref 70–99)
Glucose-Capillary: 144 mg/dL — ABNORMAL HIGH (ref 70–99)
Glucose-Capillary: 187 mg/dL — ABNORMAL HIGH (ref 70–99)

## 2022-01-12 LAB — BASIC METABOLIC PANEL
Anion gap: 4 — ABNORMAL LOW (ref 5–15)
BUN: 41 mg/dL — ABNORMAL HIGH (ref 8–23)
CO2: 30 mmol/L (ref 22–32)
Calcium: 7.9 mg/dL — ABNORMAL LOW (ref 8.9–10.3)
Chloride: 112 mmol/L — ABNORMAL HIGH (ref 98–111)
Creatinine, Ser: 0.74 mg/dL (ref 0.44–1.00)
GFR, Estimated: >60 mL/min (ref >60)
Glucose, Bld: 144 mg/dL — ABNORMAL HIGH (ref 70–99)
Potassium: 4 mmol/L (ref 3.5–5.1)
Sodium: 146 mmol/L — ABNORMAL HIGH (ref 135–145)

## 2022-01-12 LAB — MAGNESIUM: Magnesium: 2.3 mg/dL (ref 1.7–2.4)

## 2022-01-12 LAB — PHOSPHORUS: Phosphorus: 2.1 mg/dL — ABNORMAL LOW (ref 2.5–4.6)

## 2022-01-12 MED ORDER — POTASSIUM & SODIUM PHOSPHATES 280-160-250 MG PO PACK
1.0000 | PACK | Freq: Three times a day (TID) | ORAL | Status: AC
  Administered 2022-01-12 (×3): 1
  Filled 2022-01-12 (×3): qty 1

## 2022-01-12 NOTE — Progress Notes (Signed)
NAME:  Joan Hunter, MRN:  [id number], DOB:  1927/03/14, LOS: 9 ADMISSION DATE:  01/06/2022, CONSULTATION DATE:  1/19 REFERRING MD:  Kathrynn Humble, CHIEF COMPLAINT:  Out of hospital cardiac arrest   History of Present Illness:  88 y/o female with advanced dementia had an out of hospital cardiac arrest requiring CPR for around 15 minutes.  Initial rhythm was PEA.    Pertinent  Medical History  Allergic rhinitis Arthritis Coronary artery disease Dilated cardiomyopathy with systolic heart failure > LVEF 40-45%, RV function and RVSP normal, mild MR CAD DM2 Lung cancer > managed with curative radiotherapy 2013 Osteopenia Thought to have cryptogenic organizing pneumonia in 2009, formerly followed by Dr. Joya Gaskins  [facility] Events: Including procedures, antibiotic start and stop dates in addition to other pertinent events   1/19 admission 1/23 started metoprolol 1/24 meeting with family: palliative medicine;   Imaging 1/19 CT head/spine > NAICP, atrophy with chronic microvascular changes, degenerative changes without acute fracture 1/20 echo> LVEF 40-45%, RV function and RVSP normal, mild MR 1/23 EEG > moderate to severe diffuse encephalopathy 1/24 MRI Brain > findings consistent with anoxic brain injury  Procedures 1/20 echo > EF 40 to 45%, global hypokinesis  Micro: 1/19 sars cov 2 / flu > neg 1/19 urine > insignificant growth 1/19 blood > ng  Abx 1/19 cefepime > 1/20 1/19 flagyl x1 1/19 vanc x1 1/21 ceftriaxone >   Interim History / Subjective:   Remains critically ill, on vent Afebrile Urine output 500 cc charted Remains tacky Objective   Blood pressure 122/73, pulse (!) 107, temperature 98.3 F (36.8 C), temperature source Oral, resp. rate (!) 21, height 5\' 4"  (1.626 m), weight 53.8 kg, SpO2 96 %.    Vent Mode: PSV;CPAP FiO2 (%):  [30 %] 30 % PEEP:  [5 cmH20] 5 cmH20 Pressure Support:  [8 cmH20] 8 cmH20   Intake/Output Summary (Last 24 hours) at  01/12/2022 5093 Last data filed at 01/12/2022 0600 Gross per 24 hour  Intake 1476 ml  Output 500 ml  Net 976 ml    Filed Weights   01/10/22 0500 01/11/22 0319 01/12/22 0435  Weight: 55.4 kg 54.3 kg 53.8 kg    Examination:  General: Acutely ill, elderly, frail woman, intubated, no distress HENT: NCAT ETT in place PULM: Clear breath sounds bilateral, no accessory muscle use, no rhonchi, kyphotic CV: RRR, no mgr GI: BS+, soft, nontender MSK: normal bulk and tone Neuro: Unresponsive to commands, downward gaze, eyes closed, spontaneous movements of both feet, mild twitching of lips  Chest x-ray 1/28 shows stable right suprahilar opacity, trace small effusion  Labs show mild Hypernatremia, low phosphate, mild leukocytosis, hemoglobin improved to 7.3   Assessment & Plan:  Out of hospital Cardiac arrest Tele Monitor hemodynamics  Afib with RVR Systolic CHF Tele Metoprolol 50mg  bid  Anoxic brain injury > seen on MRI brain On no sedation Neuro status has plateaued, unfortunately poor prognosis for meaningful recovery  Sepsis due to UTI, no organism specified> improved Completed ceftriaxone 7 days Low-grade fever 1/27 with mild leukocytosis but no new infiltrate on chest x-ray, respiratory culture negative so far, holding off antibiotics  Acute respiratory failure with hypoxemia> unable to wean from vent due to anoxic brain injury 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   DM2 with hyperglycemia SSI to  continue  Failure to thrive Dementia Physical deconditioning Continue tube feeds  Hypernatremia -add free water Hypophosphatemia will be repleted  Resolved problems AKI> Shock liver   Summary -unfortunately neuro improvement has plateaued, poor  prognosis for meaningful neurological recovery given to daughter and granddaughter, they did not want tracheostomy but are not ready for comfort care at this time, need more time to make this decision  Best Practice (right click and "Reselect all SmartList Selections" daily)   Diet/type: tubefeeds DVT prophylaxis: prophylactic heparin  GI prophylaxis: PPI Lines: N/A Foley:  Yes, and it is still needed Code Status:  limited : no CPR, vent OK Last date of multidisciplinary goals of care discussion [1/25 family contemplating withdrawal of care, but need time to make this decision.]  Labs   CBC: Recent Labs  Lab 01/06/22 0445 01/06/22 2151 01/07/22 0549 01/08/22 0245 01/11/22 0426 01/12/22 0117  WBC 15.2*  --  15.2* 10.8* 10.8* 11.2*  NEUTROABS  --   --   --   --  7.9*  --   HGB 10.0* 9.9* 9.3* 7.1* 5.7* 7.3*  HCT 31.3* 29.0* 29.0* 21.4* 18.5* 23.1*  MCV 93.2  --  92.9 93.0 97.9 95.5  PLT 126*  --  136* 150 243 265     Basic Metabolic Panel: Recent Labs  Lab 01/06/22 0445 01/06/22 1642 01/06/22 2151 01/07/22 0549 01/07/22 0902 01/08/22 0245 01/09/22 0130 01/11/22 0426 01/12/22 0117  NA 141  --    < > 139  --  142 139 143 146*  K 3.3*  --    < > 4.7  --  4.6 4.3 4.0 4.0  CL 111  --   --  110  --  111 107 109 112*  CO2 21*  --   --  16*  --  23 26 26 30   GLUCOSE 210*  --   --  184*  --  176* 169* 152* 144*  BUN 36*  --   --  35*  --  35* 34* 50* 41*  CREATININE 1.25*  --   --  1.02*  --  0.98 0.92 0.89 0.74  CALCIUM 7.8*  --   --  7.8*  --  7.5* 7.6* 7.8* 7.9*  MG 2.1 2.0  --   --  2.1 2.0  --   --  2.3  PHOS 2.0* 1.2*  --  1.2*  --  1.9*  --   --  2.1*   < > = values in this interval not displayed.    GFR: Estimated Creatinine Clearance: 38.1 mL/min (by C-G formula based on SCr of 0.74 mg/dL). Recent Labs  Lab 01/06/22 0445 01/07/22 0549 01/08/22 0245 01/11/22 0426 01/12/22 0117  WBC 15.2* 15.2* 10.8* 10.8* 11.2*  LATICACIDVEN 2.9*  --   --   --   --       Liver Function Tests: Recent Labs  Lab 01/06/22 0445 01/07/22 0902 01/08/22 0245 01/09/22 0130  AST 89* 61* 56* 57*  ALT 166* 106* 79* 67*  ALKPHOS 87 71 64 64  BILITOT 0.3 0.3 0.6 0.6  PROT 6.1* 5.9* 5.5* 5.4*  ALBUMIN 2.1* 2.0* 1.9* 1.8*    No results for input(s): LIPASE, AMYLASE in the last 168 hours.  No results for input(s): AMMONIA in the last 168 hours.  ABG    Component Value Date/Time   PHART 7.462 (H) 01/06/2022 2151   PCO2ART 33.9 01/06/2022 2151   PO2ART 105 01/06/2022 2151   HCO3 24.3 01/06/2022 2151  TCO2 25 01/06/2022 2151   ACIDBASEDEF 3.0 (H) 01/02/2022 2318   O2SAT 98.0 01/06/2022 2151      Coagulation Profile: No results for input(s): INR, PROTIME in the last 168 hours.   Cardiac Enzymes: No results for input(s): CKTOTAL, CKMB, CKMBINDEX, TROPONINI in the last 168 hours.  HbA1C: Hgb A1c MFr Bld  Date/Time Value Ref Range Status  01/04/2022 03:26 AM 6.8 (H) 4.8 - 5.6 % Final    Comment:    (NOTE)         Prediabetes: 5.7 - 6.4         Diabetes: >6.4         Glycemic control for adults with diabetes: <7.0   02/09/2015 07:54 AM 6.6 (H) 4.8 - 5.6 % Final    Comment:    (NOTE)         Pre-diabetes: 5.7 - 6.4         Diabetes: >6.4         Glycemic control for adults with diabetes: <7.0     CBG: Recent Labs  Lab 01/11/22 1553 01/11/22 1921 01/11/22 2354 01/12/22 0307 01/12/22 0710  GLUCAP 160* 149* 211* 130* 168*     Critical care time: 31 minutes     Kara Mead MD. FCCP. [facility] Pager : [phone]  If no response to pager , please call [phone] until 7 pm After 7:00 pm call Elink  [phone]   01/12/2022

## 2022-01-13 DIAGNOSIS — G931 Anoxic brain damage, not elsewhere classified: Secondary | ICD-10-CM | POA: Diagnosis not present

## 2022-01-13 DIAGNOSIS — I469 Cardiac arrest, cause unspecified: Secondary | ICD-10-CM | POA: Diagnosis not present

## 2022-01-13 LAB — CBC
HCT: 24.3 % — ABNORMAL LOW (ref 36.0–46.0)
Hemoglobin: 7.4 g/dL — ABNORMAL LOW (ref 12.0–15.0)
MCH: 29.6 pg (ref 26.0–34.0)
MCHC: 30.5 g/dL (ref 30.0–36.0)
MCV: 97.2 fL (ref 80.0–100.0)
Platelets: 296 10*3/uL (ref 150–400)
RBC: 2.5 MIL/uL — ABNORMAL LOW (ref 3.87–5.11)
RDW: 18.6 % — ABNORMAL HIGH (ref 11.5–15.5)
WBC: 12 10*3/uL — ABNORMAL HIGH (ref 4.0–10.5)
nRBC: 0.6 % — ABNORMAL HIGH (ref 0.0–0.2)

## 2022-01-13 LAB — GLUCOSE, CAPILLARY
Glucose-Capillary: 162 mg/dL — ABNORMAL HIGH (ref 70–99)
Glucose-Capillary: 142 mg/dL — ABNORMAL HIGH (ref 70–99)
Glucose-Capillary: 171 mg/dL — ABNORMAL HIGH (ref 70–99)
Glucose-Capillary: 136 mg/dL — ABNORMAL HIGH (ref 70–99)
Glucose-Capillary: 147 mg/dL — ABNORMAL HIGH (ref 70–99)
Glucose-Capillary: 193 mg/dL — ABNORMAL HIGH (ref 70–99)

## 2022-01-13 LAB — BASIC METABOLIC PANEL
Anion gap: 10 (ref 5–15)
BUN: 41 mg/dL — ABNORMAL HIGH (ref 8–23)
CO2: 27 mmol/L (ref 22–32)
Calcium: 8 mg/dL — ABNORMAL LOW (ref 8.9–10.3)
Chloride: 110 mmol/L (ref 98–111)
Creatinine, Ser: 0.77 mg/dL (ref 0.44–1.00)
GFR, Estimated: >60 mL/min (ref >60)
Glucose, Bld: 162 mg/dL — ABNORMAL HIGH (ref 70–99)
Potassium: 4.3 mmol/L (ref 3.5–5.1)
Sodium: 147 mmol/L — ABNORMAL HIGH (ref 135–145)

## 2022-01-13 LAB — PHOSPHORUS: Phosphorus: 3.6 mg/dL (ref 2.5–4.6)

## 2022-01-13 LAB — CULTURE, RESPIRATORY W GRAM STAIN: Culture: NO GROWTH

## 2022-01-13 LAB — MAGNESIUM: Magnesium: 2.2 mg/dL (ref 1.7–2.4)

## 2022-01-13 MED ORDER — FREE WATER
200.0000 mL | Status: DC
  Administered 2022-01-13 – 2022-01-17 (×25): 200 mL

## 2022-01-13 MED ORDER — CEFAZOLIN SODIUM-DEXTROSE 1-4 GM/50ML-% IV SOLN
1.0000 g | Freq: Three times a day (TID) | INTRAVENOUS | Status: AC
  Administered 2022-01-13 – 2022-01-17 (×14): 1 g via INTRAVENOUS
  Filled 2022-01-13 (×16): qty 50

## 2022-01-13 NOTE — Progress Notes (Signed)
NAME:  Joan Hunter, MRN:  [id number], DOB:  [birth year], LOS: 40 ADMISSION DATE:  12/18/2021, CONSULTATION DATE:  1/19 REFERRING MD:  Kathrynn Humble, CHIEF COMPLAINT:  Out of hospital cardiac arrest   History of Present Illness:  88 y/o female with advanced dementia had an out of hospital cardiac arrest requiring CPR for around 15 minutes.  Initial rhythm was PEA.    Pertinent  Medical History  Allergic rhinitis Arthritis Coronary artery disease Dilated cardiomyopathy with systolic heart failure > LVEF 40-45%, RV function and RVSP normal, mild MR CAD DM2 Lung cancer > managed with curative radiotherapy 2013 Osteopenia Thought to have cryptogenic organizing pneumonia in 2009, formerly followed by Dr. Joya Gaskins  [facility] Events: Including procedures, antibiotic start and stop dates in addition to other pertinent events   1/19 admission 1/23 started metoprolol 1/24 meeting with family: palliative medicine;   Imaging 1/19 CT head/spine > NAICP, atrophy with chronic microvascular changes, degenerative changes without acute fracture 1/20 echo> LVEF 40-45%, RV function and RVSP normal, mild MR 1/23 EEG > moderate to severe diffuse encephalopathy 1/24 MRI Brain > findings consistent with anoxic brain injury  Procedures 1/20 echo > EF 40 to 45%, global hypokinesis  Micro: 1/19 sars cov 2 / flu > neg 1/19 urine > insignificant growth 1/19 blood > ng  Abx 1/19 cefepime > 1/20 1/19 flagyl x1 1/19 vanc x1 1/21 ceftriaxone > 1/27  Interim History / Subjective:   Remains critically ill, on vent No temperature documented but feels warm Urine output is low  Objective   Blood pressure 117/72, pulse (!) 121, temperature 99.4 F (37.4 C), temperature source Oral, resp. rate (!) 23, height 5\' 4"  (1.626 m), weight 53.8 kg, SpO2 100 %.    Vent Mode: CPAP;PSV FiO2 (%):  [30 %] 30 % PEEP:  [5 cmH20] 5 cmH20 Pressure Support:  [8 IWL79-89 cmH20] 10 cmH20 Plateau Pressure:   [19 cmH20] 19 cmH20   Intake/Output Summary (Last 24 hours) at 01/13/2022 0851 Last data filed at 01/13/2022 0800 Gross per 24 hour  Intake 1430 ml  Output 425 ml  Net 1005 ml    Filed Weights   01/11/22 0319 01/12/22 0435 01/13/22 0500  Weight: 54.3 kg 53.8 kg 53.8 kg    Examination:  General: Acutely ill, elderly, frail woman, intubated, no distress HENT: NCAT ETT in place PULM: Kyphotic, no accessory muscle use, decreased breath sounds bilateral CV: RRR, no mgr GI: BS+, soft, nontender MSK: normal bulk and tone, LUE appears warm, mild erythema Neuro: Unchanged -unresponsive to commands, downward gaze, eyes closed, spontaneous movements of both feet, mild twitching of lips  Chest x-ray 1/28 shows stable right suprahilar opacity, trace small effusion  Labs show mild Hypernatremia, mild leukocytosis   Assessment & Plan:  Out of hospital Cardiac arrest Tele  Afib with RVR Systolic CHF Tele Metoprolol 50mg  bid  Anoxic brain injury > confirmed on MRI brain Off sedation Neuro status  plateaued, unfortunately poor prognosis for meaningful recovery  Sepsis due to UTI, no organism specified> improved Completed ceftriaxone 7 days Low-grade fever 1/27 with mild leukocytosis but no new infiltrate on chest x-ray, respiratory culture negative so far, start cefazolin for LUE cellulitis  Acute respiratory failure with hypoxemia> unable to wean from vent due to anoxic brain injury 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  Monitor, transfusion threshold less than 7  DM2 with hyperglycemia SSI to continue  Failure to thrive Dementia Physical deconditioning Continue tube feeds  Hypernatremia -continue free water Hypophosphatemia has been  repleted  Resolved problems AKI> Shock liver   Summary -unfortunately neuro improvement has plateaued, poor prognosis for meaningful neurological recovery given to family  Best Practice (right click and "Reselect all SmartList Selections" daily)   Diet/type: tubefeeds DVT prophylaxis: prophylactic heparin  GI prophylaxis: PPI Lines: N/A Foley:  Yes, and it is still needed Code Status:  limited : no CPR, vent OK Last date of multidisciplinary goals of care discussion 1/28 poor prognosis for meaningful neurological recovery given to daughter and granddaughter, they do not want tracheostomy but  not ready for comfort care at this time, I have explained to them that she will never regain prior state of functioning  Labs   CBC: Recent Labs  Lab 01/07/22 0549 01/08/22 0245 01/11/22 0426 01/12/22 0117 01/13/22 0115  WBC 15.2* 10.8* 10.8* 11.2* 12.0*  NEUTROABS  --   --  7.9*  --   --   HGB 9.3* 7.1* 5.7* 7.3* 7.4*  HCT 29.0* 21.4* 18.5* 23.1* 24.3*  MCV 92.9 93.0 97.9 95.5 97.2  PLT 136* 150 243 265 296     Basic Metabolic Panel: Recent Labs  Lab 01/06/22 1642 01/06/22 2151 01/07/22 0549 01/07/22 0902 01/08/22 0245 01/09/22 0130 01/11/22 0426 01/12/22 0117 01/13/22 0115  NA  --    < > 139  --  142 139 143 146* 147*  K  --    < > 4.7  --  4.6 4.3 4.0 4.0 4.3  CL  --    < > 110  --  111 107 109 112* 110  CO2  --    < > 16*  --  23 26 26 30 27   GLUCOSE  --    < > 184*  --  176* 169* 152* 144* 162*  BUN  --    < > 35*  --  35* 34* 50* 41* 41*  CREATININE  --    < > 1.02*  --  0.98 0.92 0.89 0.74 0.77  CALCIUM  --    < > 7.8*  --  7.5* 7.6* 7.8* 7.9* 8.0*  MG 2.0  --   --  2.1 2.0  --   --  2.3 2.2  PHOS 1.2*  --  1.2*  --  1.9*  --   --  2.1* 3.6   < > = values in this interval not displayed.    GFR: Estimated Creatinine Clearance: 38.1 mL/min (by C-G formula based on SCr of 0.77 mg/dL). Recent Labs  Lab 01/08/22 0245 01/11/22 0426 01/12/22 0117 01/13/22 0115   WBC 10.8* 10.8* 11.2* 12.0*     Liver Function Tests: Recent Labs  Lab 01/07/22 0902 01/08/22 0245 01/09/22 0130  AST 61* 56* 57*  ALT 106* 79* 67*  ALKPHOS 71 64 64  BILITOT 0.3 0.6 0.6  PROT 5.9* 5.5* 5.4*  ALBUMIN 2.0* 1.9* 1.8*    No results for input(s): LIPASE, AMYLASE in the last 168 hours.  No results for input(s): AMMONIA in the last 168 hours.  ABG    Component Value Date/Time   PHART 7.462 (H) 01/06/2022 2151   PCO2ART 33.9 01/06/2022 2151   PO2ART 105 01/06/2022 2151   HCO3 24.3 01/06/2022 2151   TCO2 25 01/06/2022 2151   ACIDBASEDEF 3.0 (H) 12/31/2021 2318   O2SAT 98.0 01/06/2022  2151      Coagulation Profile: No results for input(s): INR, PROTIME in the last 168 hours.   Cardiac Enzymes: No results for input(s): CKTOTAL, CKMB, CKMBINDEX, TROPONINI in the last 168 hours.  HbA1C: Hgb A1c MFr Bld  Date/Time Value Ref Range Status  01/04/2022 03:26 AM 6.8 (H) 4.8 - 5.6 % Final    Comment:    (NOTE)         Prediabetes: 5.7 - 6.4         Diabetes: >6.4         Glycemic control for adults with diabetes: <7.0   02/09/2015 07:54 AM 6.6 (H) 4.8 - 5.6 % Final    Comment:    (NOTE)         Pre-diabetes: 5.7 - 6.4         Diabetes: >6.4         Glycemic control for adults with diabetes: <7.0     CBG: Recent Labs  Lab 01/12/22 1527 01/12/22 1912 01/12/22 2308 01/13/22 0317 01/13/22 0729  GLUCAP 155* 144* 187* 162* 142*     Critical care time: 31 minutes     Kara Mead MD. FCCP. [facility] Pager : [phone]  If no response to pager , please call [phone] until 7 pm After 7:00 pm call Elink  [phone]   01/13/2022

## 2022-01-13 NOTE — Progress Notes (Signed)
Pharmacy Antibiotic Note  Joan Hunter is a 88 y.o. female admitted on 12/31/2021 with cellulitis.  Pharmacy has been consulted for Cefazolin dosing.  WBC up some at 12, Tm 99.8, SCr stable.   Plan: Cefazolin 1g IV every 8 hour Monitor clinical status, renal function, and any cultures Follow-up length of therapy  Height: 5\' 4"  (162.6 cm) Weight: 53.8 kg (118 lb 9.7 oz) IBW/kg (Calculated) : 54.7  Temp (24hrs), Avg:99.1 F (37.3 C), Min:97.8 F (36.6 C), Max:99.8 F (37.7 C)  Recent Labs  Lab 01/07/22 0549 01/08/22 0245 01/09/22 0130 01/11/22 0426 01/12/22 0117 01/13/22 0115  WBC 15.2* 10.8*  --  10.8* 11.2* 12.0*  CREATININE 1.02* 0.98 0.92 0.89 0.74 0.77    Estimated Creatinine Clearance: 38.1 mL/min (by C-G formula based on SCr of 0.77 mg/dL).    Allergies  Allergen Reactions   Moxifloxacin Hives   Penicillins Hives   Prednisone Hives    Antimicrobials this admission: 1/29 Cefazolin >> 1/21 Ceftriaxone >>1/24 1/19 cefepime >>1/21 1/19 vancomycin x1 1/19 metronidazole x 1  Dose adjustments this admission:   Microbiology results: 1/19 MRSA PCR negative 1/19 Covid/flu negative 1/19 UCx >>NGTD 1/19 BCx >>NGTD 1/27 Trach Asp >>  Thank you for allowing pharmacy to be a part of this patients care.  Sloan Leiter, PharmD, BCPS, BCCCP Clinical Pharmacist Please refer to [facility] for [place] numbers 01/13/2022 9:08 AM

## 2022-01-14 DIAGNOSIS — J9601 Acute respiratory failure with hypoxia: Secondary | ICD-10-CM

## 2022-01-14 DIAGNOSIS — E43 Unspecified severe protein-calorie malnutrition: Secondary | ICD-10-CM | POA: Diagnosis not present

## 2022-01-14 DIAGNOSIS — I469 Cardiac arrest, cause unspecified: Secondary | ICD-10-CM | POA: Diagnosis not present

## 2022-01-14 DIAGNOSIS — E872 Acidosis, unspecified: Secondary | ICD-10-CM | POA: Diagnosis not present

## 2022-01-14 LAB — CBC
HCT: 23.1 % — ABNORMAL LOW (ref 36.0–46.0)
Hemoglobin: 7.1 g/dL — ABNORMAL LOW (ref 12.0–15.0)
MCH: 30.5 pg (ref 26.0–34.0)
MCHC: 30.7 g/dL (ref 30.0–36.0)
MCV: 99.1 fL (ref 80.0–100.0)
Platelets: 308 10*3/uL (ref 150–400)
RBC: 2.33 MIL/uL — ABNORMAL LOW (ref 3.87–5.11)
RDW: 19.1 % — ABNORMAL HIGH (ref 11.5–15.5)
WBC: 13.1 10*3/uL — ABNORMAL HIGH (ref 4.0–10.5)
nRBC: 0 % (ref 0.0–0.2)

## 2022-01-14 LAB — BASIC METABOLIC PANEL
Anion gap: 6 (ref 5–15)
BUN: 35 mg/dL — ABNORMAL HIGH (ref 8–23)
CO2: 27 mmol/L (ref 22–32)
Calcium: 7.9 mg/dL — ABNORMAL LOW (ref 8.9–10.3)
Chloride: 111 mmol/L (ref 98–111)
Creatinine, Ser: 0.69 mg/dL (ref 0.44–1.00)
GFR, Estimated: >60 mL/min (ref >60)
Glucose, Bld: 185 mg/dL — ABNORMAL HIGH (ref 70–99)
Potassium: 4.3 mmol/L (ref 3.5–5.1)
Sodium: 144 mmol/L (ref 135–145)

## 2022-01-14 LAB — GLUCOSE, CAPILLARY
Glucose-Capillary: 125 mg/dL — ABNORMAL HIGH (ref 70–99)
Glucose-Capillary: 147 mg/dL — ABNORMAL HIGH (ref 70–99)
Glucose-Capillary: 153 mg/dL — ABNORMAL HIGH (ref 70–99)
Glucose-Capillary: 158 mg/dL — ABNORMAL HIGH (ref 70–99)
Glucose-Capillary: 159 mg/dL — ABNORMAL HIGH (ref 70–99)
Glucose-Capillary: 175 mg/dL — ABNORMAL HIGH (ref 70–99)

## 2022-01-14 NOTE — Progress Notes (Signed)
NAME:  PATRICIAANN RABANAL, MRN:  [id number], DOB:  [birth year], LOS: 2 ADMISSION DATE:  12/19/2021, CONSULTATION DATE:  1/19 REFERRING MD:  Kathrynn Humble, CHIEF COMPLAINT:  Out of hospital cardiac arrest   History of Present Illness:  88 y/o female with advanced dementia had an out of hospital cardiac arrest requiring CPR for around 15 minutes.  Initial rhythm was PEA.    Pertinent Medical History:  Allergic rhinitis Arthritis Coronary artery disease Dilated cardiomyopathy with systolic heart failure > LVEF 40-45%, RV function and RVSP normal, mild MR CAD DM2 Lung cancer > managed with curative radiotherapy 2013 Osteopenia Thought to have cryptogenic organizing pneumonia in 2009, formerly followed by Dr. Joya Gaskins  [facility] Events: Including procedures, antibiotic start and stop dates in addition to other pertinent events   1/19 admission 1/23 started metoprolol 1/24 meeting with family: palliative medicine; MRI c/f anoxic brain injury 1/26  1/3 Remains partial code (intubation only, no CPR/ACLS/defib)  Interim History / Subjective:  Remains mildly sedated on vent Minimally responsive on neuro exam UOP remains low Ongoing GOC discussion with family  Objective:  Blood pressure 129/80, pulse (!) 117, temperature 99.4 F (37.4 C), temperature source Axillary, resp. rate (!) 25, height 5\' 4"  (1.626 m), weight 56 kg, SpO2 100 %.    Vent Mode: PSV;CPAP FiO2 (%):  [30 %] 30 % PEEP:  [5 cmH20] 5 cmH20 Pressure Support:  [10 cmH20] 10 cmH20   Intake/Output Summary (Last 24 hours) at 01/14/2022 0759 Last data filed at 01/14/2022 0736 Gross per 24 hour  Intake 2330 ml  Output 562.5 ml  Net 1767.5 ml    Filed Weights   01/12/22 0435 01/13/22 0500 01/14/22 0413  Weight: 53.8 kg 53.8 kg 56 kg   Physical Examination: General: Frail, Chronically ill-appearing elderly woman in NAD. HEENT: [place]/AT, anicteric sclera, PERRL, moist mucous membranes. ETT in place. Neuro:  Minimally  responsive.  Responds to noxious stimuli. and Withdraws to pain in all 4 extremities. Not following commands. +Cough and +Gag  CV: Mildly tachycardic, no m/g/r. PULM: Breathing even and unlabored on vent (PSV 10/5, FiO2 30%). Lung fields with coarse rhonchi throughout. GI: Soft, nontender, nondistended. Normoactive bowel sounds. Extremities: No significant LE edema noted. Skin: Warm/dry, no rashes.  Resolved Hospital Problem List:  AKI> Shock liver  Hypophosphatemia has been repleted  Assessment & Plan:  S/p OOH cardiac arrest - Supportive care - Cardiac monitoring  Afib with RVR Systolic CHF - Continue metoprolol twice daily - Cardiac monitoring as above - Continue SQH  Anoxic brain injury > confirmed on MRI brain Off sedation at present. MRI 1/24 consistent with anoxic brain injury. - No significant change to neuro status at this juncture - Unfortunately, poor prognosis for meaningful recovery - Ongoing goals of care discussion with family, they are not yet ready to withdraw care  Sepsis due to UTI, no organism specified> improved S/p ceftriaxone x 7-day course.  Low-grade fever noted 1/27 with mild leukocytosis, no new infiltrate on CXR. 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 -Trend H&H - Transfuse for hemoglobin < 7.0, or hemodynamically significant  bleeding  DM2 with hyperglycemia - Continue SSI  Failure to thrive Dementia - Baseline physical deconditioning and frailty - Continue tube feeds - Unable to participate in therapies at this time  Hypernatremia -Continue free water flushes  GOC Unfortunately, given patient's advanced age, frailty and generalized deconditioning prior to cardiac arrest, there is ongoing poor prognosis for meaningful neurologic recovery.  Patient's family members have decided against tracheostomy creation.  Ongoing discussion regarding goals of care; they are not ready to transition to comfort care at this time.  Best Practice (right click and "Reselect all SmartList Selections" daily)   Diet/type: tubefeeds DVT prophylaxis: prophylactic heparin  GI prophylaxis: PPI Lines: N/A Foley:  Yes, and it is still needed Code Status:  limited : no CPR, vent OK Last date of multidisciplinary goals of care discussion 1/28 poor prognosis for meaningful neurological recovery given to daughter and granddaughter, they do not want tracheostomy but not ready for comfort care at this time, I have explained to them that she will never regain prior state of functioning  Critical care time: 39 minutes   Lestine Mount, PA-C [facility] 01/14/22 8:00 AM  Please see [email or web address] for pager details.  From 7A-7P if no response, please call [phone] After hours, please call ELink [phone]

## 2022-01-14 NOTE — TOC Progression Note (Signed)
Transition of Care [facility]) - Initial/Assessment Note    Patient Details  Name: Joan Hunter MRN: [id number] Date of Birth: 1927-06-05  Transition of Care [facility]) CM/SW Contact:    Milinda Antis, [place] Phone Number: 01/14/2022, 2:09 PM  Clinical Narrative:                 CSW reviewed patient's chart.  Patient remains on the ventilator.  MRI Brain on 1/24 showed findings consistent with anoxic brain injury.  Palliative following.  Pending family's decision on plan of care for patient.   Transition of Care Department [facility]) has reviewed patient and no TOC needs have been identified at this time. We will continue to monitor patient advancement through interdisciplinary progression rounds. If new patient transition needs arise, please place a TOC consult.          Patient Goals and CMS Choice        Expected Discharge Plan and Services                                                Prior Living Arrangements/Services                       Activities of Daily Living Home Assistive Devices/Equipment: None    Permission Sought/Granted                  Emotional Assessment              Admission diagnosis:  Cardiac arrest ([place]) [U82.8] Metabolic acidosis [M03.49] Patient Active Problem List   Diagnosis Date Noted   Protein-calorie malnutrition, severe 01/06/2022   Cardiac arrest ([place]) 12/23/2021   Dilated cardiomyopathy ([place]) 09/20/2021   Hypokalemia 01/09/2017   Moderate dementia with behavioral disturbance 12/13/2016   History of lung cancer 12/13/2016   Cough 12/27/2015   Dependence on supplemental oxygen 02/09/2015   COPD exacerbation ([place]) 02/09/2015   Mitral regurgitation 11/18/2014   Left anterior fascicular block 11/18/2014   Non-small cell cancer of right lung ([place]) 07/09/2012   CAD (coronary artery disease) 07/09/2012   Diabetes mellitus type 2 in nonobese ([place]) 10/14/2008   HYPERCHOLESTEROLEMIA 10/14/2008   CHF  10/14/2008   PULMONARY FIBROSIS, POSTINFLAMMATORY 10/14/2008   Essential hypertension 10/13/2008   PCP:  [name], MD Pharmacy:   [id number] - [place], [place] [address] [place] [address] Phone: [phone] Fax: [phone]     Social Determinants of Health (SDOH) Interventions    Readmission Risk Interventions Readmission Risk Prevention Plan 01/08/2022  Transportation Screening Complete  PCP or Specialist Appt within 3-5 Days Not Complete  Not Complete comments Patient still in ICU  [place] or [place] Not Complete  HRI or Home Care Consult comments Waiting for [place] meeting to determine next steps  Social Work Consult for [place] Planning/Counseling Complete  Palliative Care Screening Complete  Medication Review (RN Transport planner) Referral to Pharmacy  Some recent data might be hidden

## 2022-01-15 DIAGNOSIS — Z978 Presence of other specified devices: Secondary | ICD-10-CM | POA: Diagnosis not present

## 2022-01-15 DIAGNOSIS — I469 Cardiac arrest, cause unspecified: Secondary | ICD-10-CM | POA: Diagnosis not present

## 2022-01-15 DIAGNOSIS — E43 Unspecified severe protein-calorie malnutrition: Secondary | ICD-10-CM | POA: Diagnosis not present

## 2022-01-15 DIAGNOSIS — G931 Anoxic brain damage, not elsewhere classified: Secondary | ICD-10-CM | POA: Diagnosis not present

## 2022-01-15 DIAGNOSIS — F0390 Unspecified dementia without behavioral disturbance: Secondary | ICD-10-CM | POA: Diagnosis not present

## 2022-01-15 LAB — BPAM RBC
Blood Product Expiration Date: 202302162359
Blood Product Expiration Date: 202302162359
ISSUE DATE / TIME: 202301270805
ISSUE DATE / TIME: 202301271119
Unit Type and Rh: 5100
Unit Type and Rh: 5100

## 2022-01-15 LAB — BASIC METABOLIC PANEL
Anion gap: 8 (ref 5–15)
BUN: 35 mg/dL — ABNORMAL HIGH (ref 8–23)
CO2: 26 mmol/L (ref 22–32)
Calcium: 8.2 mg/dL — ABNORMAL LOW (ref 8.9–10.3)
Chloride: 107 mmol/L (ref 98–111)
Creatinine, Ser: 0.53 mg/dL (ref 0.44–1.00)
GFR, Estimated: >60 mL/min (ref >60)
Glucose, Bld: 97 mg/dL (ref 70–99)
Potassium: 4.3 mmol/L (ref 3.5–5.1)
Sodium: 141 mmol/L (ref 135–145)

## 2022-01-15 LAB — PHOSPHORUS: Phosphorus: 3.5 mg/dL (ref 2.5–4.6)

## 2022-01-15 LAB — CBC
HCT: 25.2 % — ABNORMAL LOW (ref 36.0–46.0)
Hemoglobin: 7.9 g/dL — ABNORMAL LOW (ref 12.0–15.0)
MCH: 30.7 pg (ref 26.0–34.0)
MCHC: 31.3 g/dL (ref 30.0–36.0)
MCV: 98.1 fL (ref 80.0–100.0)
Platelets: 327 10*3/uL (ref 150–400)
RBC: 2.57 MIL/uL — ABNORMAL LOW (ref 3.87–5.11)
RDW: 19 % — ABNORMAL HIGH (ref 11.5–15.5)
WBC: 13.2 10*3/uL — ABNORMAL HIGH (ref 4.0–10.5)
nRBC: 0 % (ref 0.0–0.2)

## 2022-01-15 LAB — TYPE AND SCREEN
ABO/RH(D): O POS
Antibody Screen: NEGATIVE
Unit division: 0
Unit division: 0

## 2022-01-15 LAB — GLUCOSE, CAPILLARY
Glucose-Capillary: 113 mg/dL — ABNORMAL HIGH (ref 70–99)
Glucose-Capillary: 131 mg/dL — ABNORMAL HIGH (ref 70–99)
Glucose-Capillary: 132 mg/dL — ABNORMAL HIGH (ref 70–99)
Glucose-Capillary: 138 mg/dL — ABNORMAL HIGH (ref 70–99)
Glucose-Capillary: 183 mg/dL — ABNORMAL HIGH (ref 70–99)
Glucose-Capillary: 198 mg/dL — ABNORMAL HIGH (ref 70–99)

## 2022-01-15 LAB — MAGNESIUM: Magnesium: 2.1 mg/dL (ref 1.7–2.4)

## 2022-01-15 NOTE — Progress Notes (Signed)
NAME:  Joan Hunter, MRN:  [id number], DOB:  1927/11/12, LOS: 12 ADMISSION DATE:  12/22/2021, CONSULTATION DATE:  1/19 REFERRING MD:  Kathrynn Humble, CHIEF COMPLAINT:  Out of hospital cardiac arrest   History of Present Illness:  88 y/o female with advanced dementia had an out of hospital cardiac arrest requiring CPR for around 15 minutes.  Initial rhythm was PEA.    Pertinent Medical History:  Allergic rhinitis Arthritis Coronary artery disease Dilated cardiomyopathy with systolic heart failure > LVEF 40-45%, RV function and RVSP normal, mild MR CAD DM2 Lung cancer > managed with curative radiotherapy 2013 Osteopenia Thought to have cryptogenic organizing pneumonia in 2009, formerly followed by Dr. Joya Gaskins  [facility] Events: Including procedures, antibiotic start and stop dates in addition to other pertinent events   1/19 admission 1/23 started metoprolol 1/24 meeting with family: palliative medicine; MRI c/f anoxic brain injury 1/26  1/3 Remains partial code (intubation only, no CPR/ACLS/defib)  Interim History / Subjective:  Remains mildly sedated on vent Minimally responsive on neuro exam UOP remains low Ongoing GOC discussion with family  Objective:  Blood pressure 127/76, pulse (!) 102, temperature 98.7 F (37.1 C), temperature source Oral, resp. rate 14, height 5\' 4"  (1.626 m), weight 56 kg, SpO2 100 %.    Vent Mode: PSV;CPAP FiO2 (%):  [30 %] 30 % PEEP:  [5 cmH20] 5 cmH20 Pressure Support:  [10 cmH20] 10 cmH20   Intake/Output Summary (Last 24 hours) at 01/15/2022 0858 Last data filed at 01/15/2022 4259 Gross per 24 hour  Intake 3009.13 ml  Output 650 ml  Net 2359.13 ml   Filed Weights   01/13/22 0500 01/14/22 0413 01/15/22 0500  Weight: 53.8 kg 56 kg 56 kg   Physical Examination: General: Frail chronically ill woman, ventilated HEENT: Neck is flexed, soft neck collar is in place.  ET tube in place with some oral secretions.  Eyes with downward  gaze Neuro: Unresponsive to voice, pain/stimulus.  She does withdraw.  Toes upgoing.  Spontaneous drive to breathe intact.  Positive gag, positive cough CV: Distant, regular, no murmur PULM: Clear bilaterally.  Currently tolerating PS 10 GI: Nondistended, positive bowel sounds Extremities: No deformity, no lower extremity edema Skin: No rash  Resolved Hospital Problem List:  AKI> Shock liver  Hypophosphatemia has been repleted  Assessment & Plan:  S/p OOH cardiac arrest -Continue telemetry -Currently hemodynamically stable, supportive care  Afib with RVR Systolic CHF -Metoprolol twice daily -Subcutaneous heparin  Anoxic brain injury > confirmed on MRI brain Off sedation at present. MRI 1/24 consistent with anoxic brain injury. -Prognosis for meaningful neurological recovery very poor here unfortunately. -Continue goals of care discussion with family.  Acknowledge that she would not want, benefit from tracheostomy but difficulty approaching formal withdrawal of care  Sepsis due to UTI, no organism specified> improved Left upper extremity cellulitis S/p ceftriaxone x 7-day course.  Low-grade fever noted 1/27 with mild leukocytosis, no new infiltrate on CXR. 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  Hemoglobin 7.9 on  1/31 -Follow CBC intermittently -Transfuse for hemoglobin < 7.0, or hemodynamically significant bleeding  DM2 with hyperglycemia -Sliding  scale insulin as ordered  Failure to thrive Dementia -Unable to participate in therapies at this time -Continue tube feeding  Hypernatremia improved on free water -Continue free water flushes  GOC Unfortunately, given patient's advanced age, frailty and generalized deconditioning prior to cardiac arrest, there is ongoing poor prognosis for meaningful neurologic recovery.  Patient's family members have decided against tracheostomy creation.   -Plan to continue discussions with family regarding possible withdrawal of care  Best Practice (right click and "Reselect all SmartList Selections" daily)   Diet/type: tubefeeds DVT prophylaxis: prophylactic heparin  GI prophylaxis: PPI Lines: N/A Foley:  Yes, and it is still needed Code Status:  limited : no CPR, vent OK Last date of multidisciplinary goals of care discussion 1/28 poor prognosis for meaningful neurological recovery given to daughter and granddaughter, they do not want tracheostomy but not ready for comfort care at this time, I have explained to them that she will never regain prior state of functioning  Critical care time: 31 minutes    Baltazar Apo, MD, PhD 01/15/2022, 8:58 AM [facility] [phone] or if no answer before 7:00PM call [phone] For any issues after 7:00PM please call eLink [phone]

## 2022-01-15 NOTE — Plan of Care (Signed)
PCCM Interval Note  I spoke with the patient's daughter [name]. Updated her on status.  She indicated that she and granddaughter do believe that transition to withdrawal of care is necessary, that the patient would not want a tracheostomy. That said, she was clear that they are not ready to do so at this time, that they do not agree with the proposed plan to one-way extubate on 01/17/22. I assured her that we would not make such a change without coordination with them and their agreement. I also explained my concern that continued MV in this setting may be causing unnecessary suffering, and that I believe we should transition to comfort as expediently as they are able. She understood this.   Independent CC time 35 minutes   Baltazar Apo, MD, PhD 01/15/2022, 6:30 PM [facility] [phone] or if no answer before 7:00PM call [phone] For any issues after 7:00PM please call eLink [phone]

## 2022-01-15 NOTE — Progress Notes (Signed)
Patient ID: Joan Hunter, female   DOB: 1927-09-29, 87 y.o.   MRN: [id number]     Progress Note from the Palliative Medicine Team at [facility]   [name]        Date: 01/15/2022 DOB: 1927/08/09  Age: 88 y.o. MRN#: [id number] Attending Physician: Collene Gobble, MD Primary Care Physician: [name], MD Admit Date: 12/27/2021   Medical records reviewed   88 year old female with past medical history as below, which is significant for dementia, lung cancer status post curative radiotherapy, coronary artery disease, dilated cardiomyopathy,  hypertension, and diabetes mellitus.    At baseline the patient with moderate to severe dementia   She is only able to minimally assist with activities of daily living and her oral intake is fallen off quite a bit.  She was sleeping more and eating less.  Patient became unresponsive, 911 was called, CPR initiated in the field,CPR for approximately 15 minutes prior to ROSC.   Initial rhythm was PEA.  Patient was intubated.   Today is day 4 of this hospitalization.  Patient remains intubated, does not follow commands, decreased response to painful stimuli.  IMPRESSION:  EEG This study showed evidence of epileptogenicity with generalized onset.  Additionally EEG is suggestive moderate to severe diffuse encephalopathy, nonspecific etiology but could be secondary to anoxic/hypoxic brain injury in the setting of cardiac arrest.  No seizures were seen throughout the recording.  IMPRESSION:   MRI 1. Restricted diffusion in the bilateral perirolandic and occipital cortex, a global anoxic pattern of ischemia. 2. Brain atrophy in keeping with history of dementia.  Initial consult with palliative medicine 01/06/2022.  Family face treatment option decisions, advanced directive decisions and anticipatory care needs.    This NP visited patient at the bedside as a follow up for palliative medicine needs and emotional support.     Patient remains intubated, with cor-trach for nutritional support, unable to follow commands, minimal response to painful stimuli.   Spoke to daughter by telephone.    Education offered on current medical situation.  Unfortunately given the patient's advanced age, frailty and generalized deconditioning prior to cardiac arrest along with a dementia diagnosis, there is ongoing poor prognosis for meaningful neurologic recovery.  She tells me she understands the situation and in not making any decisions in the next few days.  She is clear that she is not interested in trach or PEG, however at this time she cannot put any limits on medical interventions and that she will "let us know when she has made a decision "regarding de-escalation of care.  Reinforced with family the importance of continued conversation with each other and the  medical providers regarding overall plan of care and treatment options,  ensuring decisions are within the context of the patients values and GOCs.  Questions and concerns addressed       Discussed with Dr Lamonte Sakai and April RN   Wadie Lessen NP  Palliative Medicine Team Team Phone # [phone] Pager [phone]

## 2022-01-16 DIAGNOSIS — I469 Cardiac arrest, cause unspecified: Secondary | ICD-10-CM | POA: Diagnosis not present

## 2022-01-16 LAB — GLUCOSE, CAPILLARY
Glucose-Capillary: 142 mg/dL — ABNORMAL HIGH (ref 70–99)
Glucose-Capillary: 143 mg/dL — ABNORMAL HIGH (ref 70–99)
Glucose-Capillary: 151 mg/dL — ABNORMAL HIGH (ref 70–99)
Glucose-Capillary: 154 mg/dL — ABNORMAL HIGH (ref 70–99)
Glucose-Capillary: 157 mg/dL — ABNORMAL HIGH (ref 70–99)
Glucose-Capillary: 184 mg/dL — ABNORMAL HIGH (ref 70–99)

## 2022-01-16 MED ORDER — FENTANYL CITRATE (PF) 100 MCG/2ML IJ SOLN
25.0000 ug | Freq: Once | INTRAMUSCULAR | Status: AC
  Administered 2022-01-16: 25 ug via INTRAVENOUS

## 2022-01-16 MED ORDER — FENTANYL BOLUS VIA INFUSION
25.0000 ug | INTRAVENOUS | Status: DC | PRN
  Administered 2022-01-16 – 2022-01-17 (×4): 25 ug via INTRAVENOUS
  Administered 2022-01-17: 50 ug via INTRAVENOUS
  Filled 2022-01-16: qty 100

## 2022-01-16 MED ORDER — FENTANYL 2500MCG IN NS 250ML (10MCG/ML) PREMIX INFUSION
25.0000 ug/h | INTRAVENOUS | Status: DC
  Administered 2022-01-16: 25 ug/h via INTRAVENOUS
  Administered 2022-01-18: 50 ug/h via INTRAVENOUS
  Filled 2022-01-16 (×2): qty 250

## 2022-01-16 NOTE — Progress Notes (Addendum)
NAME:  Joan Hunter, MRN:  [id number], DOB:  1927/11/25, LOS: 11 ADMISSION DATE:  12/21/2021, CONSULTATION DATE:  1/19 REFERRING MD:  Kathrynn Humble, CHIEF COMPLAINT:  Out of hospital cardiac arrest   History of Present Illness:  88 y/o female with advanced dementia had an out of hospital cardiac arrest requiring CPR for around 15 minutes.  Initial rhythm was PEA.    Pertinent Medical History:  Allergic rhinitis Arthritis Coronary artery disease Dilated cardiomyopathy with systolic heart failure > LVEF 40-45%, RV function and RVSP normal, mild MR CAD DM2 Lung cancer > managed with curative radiotherapy 2013 Osteopenia Thought to have cryptogenic organizing pneumonia in 2009, formerly followed by Dr. Joya Gaskins  [facility] Events: Including procedures, antibiotic start and stop dates in addition to other pertinent events   1/19 admission 1/23 started metoprolol 1/24 meeting with family: palliative medicine; MRI c/f anoxic brain injury 1/26  1/3 Remains partial code (intubation only, no CPR/ACLS/defib)  Interim History / Subjective:   Spoke with family 1/31 regarding GOC and plans for care No new issues reported  Objective:  Blood pressure 103/70, pulse (!) 107, temperature 98 F (36.7 C), temperature source Oral, resp. rate 16, height 5\' 4"  (1.626 m), weight 57.3 kg, SpO2 100 %.    Vent Mode: PSV;CPAP FiO2 (%):  [30 %] 30 % PEEP:  [5 cmH20] 5 cmH20 Pressure Support:  [10 cmH20] 10 cmH20   Intake/Output Summary (Last 24 hours) at 01/16/2022 0803 Last data filed at 01/16/2022 0700 Gross per 24 hour  Intake 2011.1 ml  Output 425 ml  Net 1586.1 ml   Filed Weights   01/14/22 0413 01/15/22 0500 01/16/22 0352  Weight: 56 kg 56 kg 57.3 kg   Physical Examination: General: Middle-aged, frail, chronically ill HEENT: Neck is flexed, soft collar in place.  ET tube in place with some oral secretions Neuro: Unresponsive to pain, voice.  She does withdraw.  Does not wake,  track, follow commands.  Eyes with a downward gaze preference CV: Regular, distant, no murmur PULM: Clear bilaterally.  Tolerates PSV GI: Stented with positive bowel sounds Extremities: No deformity, no lower extremity edema Skin: No rash noted  Resolved Hospital Problem List:  AKI> Shock liver  Hypophosphatemia has been repleted  Assessment & Plan:  S/p OOH cardiac arrest -continue tele -Currently hemodynamically stable, supportive care  Afib with RVR Systolic CHF -Continue current metoprolol, subcutaneous heparin  Anoxic brain injury > confirmed on MRI brain Off sedation at present. MRI 1/24 consistent with anoxic brain injury. -Very poor prognosis for meaningful neurological recovery.  Family is aware.  Continuing goals of care discussions.  Spoke with the patient's daughter 1/31.  She and the patient's granddaughter are aware of poor prognosis.  They are having difficulty approaching formal withdrawal care- -we have been conservative with sedation/avoiding sedation to allow adequate neurological exam.  Given the course, prognosis I think it would be reasonable to start low-dose narcotic to ensure her comfort.  Sepsis due to UTI, no organism specified> improved Left upper extremity cellulitis S/p ceftriaxone x 7-day course.  Low-grade fever noted 1/27 with mild leukocytosis, no new infiltrate on CXR. 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  Hemoglobin 7.9  on 1/31 -Follow CBC intermittently -Transfusion goal hemoglobin 7.0  DM2 with hyperglycemia -Sliding-scale insulin as ordered  Failure to thrive Dementia -Unable to participate in therapies at this time -Continue tube feeding  Hypernatremia improved on free water -Continue free water as ordered  [place] Unfortunately, given patient's advanced age, frailty and generalized deconditioning prior to cardiac arrest, there is ongoing poor prognosis for meaningful neurologic recovery.  Patient's family members have decided against tracheostomy creation.   -Plan to continue discussions with family regarding possible withdrawal of care.  Discussed with the patient's daughter on 1/31 as below  Best Practice (right click and "Reselect all SmartList Selections" daily)   Diet/type: tubefeeds DVT prophylaxis: prophylactic heparin  GI prophylaxis: PPI Lines: N/A Foley:  Yes, and it is still needed Code Status:  limited : no CPR, vent OK Last date of multidisciplinary goals of care discussion 1/31.  She and granddaughter are the involved family.  Both are aware of the patient's poor prognosis for any neurological recovery.  They do not want tracheostomy.  They are clear that the appropriate course here is a formal withdrawal of care but have been unready to proceed.  Will need to continue to discuss.  Critical care time: 31 minutes    Baltazar Apo, MD, PhD 01/16/2022, 8:03 AM [facility] [phone] or if no answer before 7:00PM call [phone] For any issues after 7:00PM please call eLink [phone]

## 2022-01-16 NOTE — Progress Notes (Signed)
Pharmacy Antibiotic Note  Joan Hunter is a 88 y.o. female admitted on 01/13/2022 with cellulitis.  Pharmacy has been consulted for Cefazolin dosing.  WBC up some to 13.2, Afeb, SCr stable.   Plan: Cefazolin 1g IV every 8 hour - stops 2/3 Monitor clinical status, renal function, and any cultures  Height: 5\' 4"  (162.6 cm) Weight: 57.3 kg (126 lb 5.2 oz) IBW/kg (Calculated) : 54.7  Temp (24hrs), Avg:98.1 F (36.7 C), Min:97.7 F (36.5 C), Max:98.8 F (37.1 C)  Recent Labs  Lab 01/11/22 0426 01/12/22 0117 01/13/22 0115 01/14/22 0036 01/15/22 0210  WBC 10.8* 11.2* 12.0* 13.1* 13.2*  CREATININE 0.89 0.74 0.77 0.69 0.53     Estimated Creatinine Clearance: 38.7 mL/min (by C-G formula based on SCr of 0.53 mg/dL).    Allergies  Allergen Reactions   Moxifloxacin Hives   Penicillins Hives   Prednisone Hives    Antimicrobials this admission: 1/29 Cefazolin >>2/3 1/21 Ceftriaxone >>1/24 1/19 cefepime >>1/21 1/19 vancomycin x1 1/19 metronidazole x 1  Dose adjustments this admission:   Microbiology results: 1/19 MRSA PCR negative 1/19 Covid/flu negative 1/19 UCx >><10kcol insignificant growth 1/19 BCx >>NgF 1/27 Trach Asp >>NgF  Thank you for allowing pharmacy to be a part of this patients care.  Sherlon Handing, PharmD, BCPS Please see amion for complete clinical pharmacist phone list 01/16/2022 9:11 AM

## 2022-01-16 NOTE — Progress Notes (Signed)
Nutrition Follow-up  DOCUMENTATION CODES:   Underweight, Severe malnutrition in context of chronic illness  INTERVENTION:   Continue tube feeds via OG tube: - Vital AF 1.2 @ 50 ml/hr (1200 ml/day) - Free water flushes per CCM, currently 200 ml q 4 hours  Tube feeding regimen provides 1440 kcal, 90 grams of protein, and 973 ml of H2O.  Total free water with flushes: 2173 ml  NUTRITION DIAGNOSIS:   Severe Malnutrition related to chronic illness (dementia, CHF) as evidenced by severe fat depletion, severe muscle depletion.  Ongoing, being addressed via TF  GOAL:   Patient will meet greater than or equal to 90% of their needs  Met via TF  MONITOR:   Vent status, Labs, Weight trends, TF tolerance  REASON FOR ASSESSMENT:   Ventilator, Consult Enteral/tube feeding initiation and management  ASSESSMENT:   88 year old female who presented to the ED on 1/19 after a witnessed cardiac arrest at home. Pt required intubation. PMH of dementia, lung cancer s/p curative radiotherapy, CAD, HTN, T2DM, CHF, HLD. Pt admitted with PEA cardiac arrest, possible sepsis, AKI from ATN secondary to shock.  Will discuss pt with RN and during ICU rounds. Pt with anoxic brain injury. Ongoing [place] discussions. Palliative Medicine involved. Per notes, pt's family does believe that transition to withdrawal of care is necessary and that pt would not want tracheostomy but are not ready to withdraw care at this time.  Pt tolerating current tube feeds. Will continue with current regimen. CCM MD has ordered free water flushes due to hypernatremia. Plan to continue with current free water flushes.  Current TF: Vital AF 1.2 @ 50 ml/hr, free water flushes of 200 ml q 4 hours  Admit weight: 50 kg Current weight: 57.3 kg  Pt with mild pitting generalized edema and moderate pitting edema to BUE.  Patient remains intubated on ventilator support MV: 9.2 L/min Temp (24hrs), Avg:98.1 F (36.7 C), Min:97.7 F  (36.5 C), Max:98.8 F (37.1 C)  Drips: Fentanyl  Medications reviewed and include: colace, SSI q 4 hours, protonix, miralax, IV abx  Labs reviewed: BUN 35, hemoglobin 7.9, WBC 13.2 CBG's: 132-198 x 24 hours  UOP: 425 ml x 24 hours I/O's: +16.3 L since admit  Diet Order:   Diet Order             Diet NPO time specified  Diet effective now                   EDUCATION NEEDS:   Not appropriate for education at this time  Skin:  Skin Assessment: Skin Integrity Issues: DTI: sacrum  Last BM:  01/15/22 small type 7  Height:   Ht Readings from Last 1 Encounters:  01/04/22 5' 4"  (1.626 m)    Weight:   Wt Readings from Last 1 Encounters:  01/16/22 57.3 kg    BMI:  Body mass index is 21.68 kg/m.  Estimated Nutritional Needs:   Kcal:  1400-1600  Protein:  70-85 grams  Fluid:  1.4-1.6 L    Gustavus Bryant, MS, RD, LDN Inpatient Clinical Dietitian Please see AMiON for contact information.

## 2022-01-16 DEATH — deceased

## 2022-01-17 DIAGNOSIS — I469 Cardiac arrest, cause unspecified: Secondary | ICD-10-CM | POA: Diagnosis not present

## 2022-01-17 LAB — GLUCOSE, CAPILLARY
Glucose-Capillary: 135 mg/dL — ABNORMAL HIGH (ref 70–99)
Glucose-Capillary: 137 mg/dL — ABNORMAL HIGH (ref 70–99)
Glucose-Capillary: 159 mg/dL — ABNORMAL HIGH (ref 70–99)

## 2022-01-17 MED ORDER — FREE WATER
200.0000 mL | Freq: Three times a day (TID) | Status: DC
  Administered 2022-01-17 – 2022-01-20 (×9): 200 mL

## 2022-01-17 NOTE — Progress Notes (Signed)
Patient ID: [name], female   DOB: 12-14-1927, 88 y.o.   MRN: [id number]     Progress Note from the Palliative Medicine Team at [facility]   [name]        Date: 01/17/2022 DOB: 1927/05/08  Age: 88 y.o. MRN#: [id number] Attending Physician: Collene Gobble, MD Primary Care Physician: [name], MD Admit Date: 12/18/2021   Medical records reviewed   88 year old female with past medical history as below, which is significant for dementia, lung cancer status post curative radiotherapy, coronary artery disease, dilated cardiomyopathy,  hypertension, and diabetes mellitus.    At baseline the patient with moderate to severe dementia   She is only able to minimally assist with activities of daily living and her oral intake is fallen off quite a bit.  She was sleeping more and eating less.  Patient became unresponsive, 911 was called, CPR initiated in the field,CPR for approximately 15 minutes prior to ROSC.   Initial rhythm was PEA.  Patient was intubated.   Today is day  13 of this hospitalization.  Patient remains intubated, does not follow commands, decreased response to painful stimuli.  IMPRESSION:  EEG This study showed evidence of epileptogenicity with generalized onset.  Additionally EEG is suggestive moderate to severe diffuse encephalopathy, nonspecific etiology but could be secondary to anoxic/hypoxic brain injury in the setting of cardiac arrest.  No seizures were seen throughout the recording.  IMPRESSION:   MRI 1. Restricted diffusion in the bilateral perirolandic and occipital cortex, a global anoxic pattern of ischemia. 2. Brain atrophy in keeping with history of dementia.  Initial consult with palliative medicine 01/06/2022.  Family face treatment option decisions, advanced directive decisions and anticipatory care needs.    This NP visited patient at the bedside as a follow up for palliative medicine needs and emotional support.     Patient remains intubated, with cor-trach for nutritional support, unable to follow commands, minimal response to painful stimuli.   Spoke to daughter by telephone.    Daughter very clearly verbalizes that she wishes to work with Dr. Lamonte Sakai and bedside RN Lucky Cowboy RN as she makes decisions regarding how to proceed with pending decisions.   Palliative medicine team will sign off at this time, please reconsult with needs into the future. Daughter [name] spoke with Loma Sousa and stated that they wish to proceed with comfort care and a one-way extubation on Sunday (01-20-2022) at noon.   No charge  Wadie Lessen NP  Palliative Medicine Team Team Phone # [phone] Pager [phone]

## 2022-01-17 NOTE — Progress Notes (Signed)
Spoke with daughter Emi Holes, they have decided to proceed with comfort care on Sunday 2/5 at noon. The would like for the Chaplain to be present at this time. The daughter would also like for Dr. Lamonte Sakai to call her at his earliest convince.

## 2022-01-17 NOTE — Progress Notes (Signed)
NAME:  Joan Hunter, MRN:  [id number], DOB:  29-Nov-1927, LOS: 49 ADMISSION DATE:  01/02/2022, CONSULTATION DATE:  1/19 REFERRING MD:  Kathrynn Humble, CHIEF COMPLAINT:  Out of hospital cardiac arrest   History of Present Illness:  88 y/o female with advanced dementia had an out of hospital cardiac arrest requiring CPR for around 15 minutes.  Initial rhythm was PEA.    Pertinent Medical History:  Allergic rhinitis Arthritis Coronary artery disease Dilated cardiomyopathy with systolic heart failure > LVEF 40-45%, RV function and RVSP normal, mild MR CAD DM2 Lung cancer > managed with curative radiotherapy 2013 Osteopenia Thought to have cryptogenic organizing pneumonia in 2009, formerly followed by Dr. Joya Gaskins  [facility] Events: Including procedures, antibiotic start and stop dates in addition to other pertinent events   1/19 admission 1/23 started metoprolol 1/24 meeting with family: palliative medicine; MRI c/f anoxic brain injury 1/26  1/3 Remains partial code (intubation only, no CPR/ACLS/defib)  Interim History / Subjective:   No significant interval change overnight Fentanyl infusion started, currently 25   Objective:  Blood pressure 106/76, pulse 86, temperature 98.4 F (36.9 C), temperature source Oral, resp. rate 12, height 5\' 4"  (1.626 m), weight 59.8 kg, SpO2 100 %.    Vent Mode: PSV;CPAP FiO2 (%):  [30 %] 30 % PEEP:  [5 cmH20] 5 cmH20 Pressure Support:  [10 cmH20] 10 cmH20 Plateau Pressure:  [19 cmH20-21 cmH20] 20 cmH20   Intake/Output Summary (Last 24 hours) at 01/17/2022 0753 Last data filed at 01/17/2022 0600 Gross per 24 hour  Intake 1966.56 ml  Output 900 ml  Net 1066.56 ml   Filed Weights   01/15/22 0500 01/16/22 0352 01/17/22 0348  Weight: 56 kg 57.3 kg 59.8 kg   Physical Examination: General: Elderly woman, intubated and poorly responsive HEENT: Significant neck flexion with a soft collar in place.  ET tube in place, scant oral  secretions Neuro: Downward gaze preference, does not open eyes to stimulation.  Does not follow commands.  No spontaneous movement noted.  She does have spontaneous dry debris CV: Distant, regular, no murmur PULM: Clear bilaterally GI: Nondistended with positive bowel sounds Extremities: No lower extremity edema Skin: No rash -  Resolved Hospital Problem List:  AKI> Shock liver  Hypophosphatemia has been repleted  Assessment & Plan:  S/p OOH cardiac arrest -Telemetry monitoring -Remains hemodynamically stable on current supportive care  Afib with RVR Systolic CHF -Remains on metoprolol for tube -Deferring systemic anticoagulation, on subcutaneous heparin prophylaxis  Anoxic brain injury > confirmed on MRI brain No change in neurological status off sedating medication for several days, restarted fentanyl 2/1 -Family understands poor prognosis for any neurological recovery.  Goals of care appear to be clear.  Enacting withdrawal of care has been difficult for family and they have not been able to plan timing.  We are trying to support through this, but I have explained that I believe most appropriate course would be transition off MV soon to ensure the patient's comfort and dignity.  Daughter and granddaughter are involved. -Plan to continue fentanyl  Sepsis due to UTI, no organism specified> improved Left upper extremity cellulitis S/p ceftriaxone x 7-day course.  Low-grade fever noted 1/27 with mild leukocytosis, no new infiltrate on CXR. -Day 5 of 5 cefazolin for left upper extremity cellulitis on 2/2.  Ends today -Follow clinically.  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 -Plan one-way extubation to comfort when family  agrees  Anemia of critical illness 1/27 Hemoglobin dropped from 7.1-5.7, s/p 1 U PRBC, no evidence of blood loss.  Hemoglobin 7.9 on 1/31 -Defer any further blood draws if possible  DM2 with hyperglycemia -Stop sliding scale insulin and CBG on 2/2 given plans to concentrate on patient's comfort  Failure to thrive Dementia -Unable to participate in therapies at this time -Continue tube feeding until time for extubation  Hypernatremia improved on free water -Continue free water as ordered  [place] Ongoing discussions with patient's daughter, granddaughter, last on 2/1.  They understand poor prognosis.  They understand that she would not want tracheostomy and that transition off MV is a next step and the patient's care.  Continue to struggle with this with regard to timing.  They stated that they will be prepared to extubate "in the next week" on 2/1.  Not clear to me that involving ethics consult will change things as family agrees with the plan, just struggling with enacting it.  We will have to continue to try to support, move them towards transition to comfort.   Best Practice (right click and "Reselect all SmartList Selections" daily)   Diet/type: tubefeeds DVT prophylaxis: prophylactic heparin  GI prophylaxis: PPI Lines: N/A Foley:  Yes, and it is still needed Code Status:  limited : no CPR, vent OK Last date of multidisciplinary goals of care discussion 2/1.  See above  Critical care time: 31 minutes    Baltazar Apo, MD, PhD 01/17/2022, 7:53 AM [place] [phone] or if no answer before 7:00PM call [phone] For any issues after 7:00PM please call eLink [phone]

## 2022-01-18 DIAGNOSIS — I469 Cardiac arrest, cause unspecified: Secondary | ICD-10-CM | POA: Diagnosis not present

## 2022-01-18 MED ORDER — METOPROLOL TARTRATE 25 MG/10 ML ORAL SUSPENSION
25.0000 mg | Freq: Two times a day (BID) | ORAL | Status: DC
  Administered 2022-01-18 – 2022-01-20 (×5): 25 mg
  Filled 2022-01-18 (×6): qty 10

## 2022-01-18 NOTE — Progress Notes (Signed)
NAME:  Joan Hunter, MRN:  [id number], DOB:  [birth year], LOS: 47 ADMISSION DATE:  01/11/2022, CONSULTATION DATE:  1/19 REFERRING MD:  Kathrynn Humble, CHIEF COMPLAINT:  Out of hospital cardiac arrest   History of Present Illness:  88 y/o female with advanced dementia had an out of hospital cardiac arrest requiring CPR for around 15 minutes.  Initial rhythm was PEA.    Pertinent Medical History:  Allergic rhinitis Arthritis Coronary artery disease Dilated cardiomyopathy with systolic heart failure > LVEF 40-45%, RV function and RVSP normal, mild MR CAD DM2 Lung cancer > managed with curative radiotherapy 2013 Osteopenia Thought to have cryptogenic organizing pneumonia in 2009, formerly followed by Dr. Joya Gaskins  [facility] Events: Including procedures, antibiotic start and stop dates in addition to other pertinent events   1/19 admission 1/23 started metoprolol 1/24 meeting with family: palliative medicine; MRI c/f anoxic brain injury 1/26  1/3 Remains partial code (intubation only, no CPR/ACLS/defib)  Interim History / Subjective:   No significant change overnight  Remains unresponsive on vent Plans for comfort care transition on Sunday per palliative care   Objective:  Blood pressure 111/60, pulse 88, temperature 98.4 F (36.9 C), temperature source Oral, resp. rate 14, height 5\' 4"  (1.626 m), weight 60.6 kg, SpO2 100 %.    Vent Mode: PRVC FiO2 (%):  [30 %-40 %] 40 % Set Rate:  [16 bmp] 16 bmp Vt Set:  [430 mL] 430 mL PEEP:  [5 cmH20] 5 cmH20 Pressure Support:  [10 cmH20] 10 cmH20 Plateau Pressure:  [14 cmH20-20 cmH20] 20 cmH20   Intake/Output Summary (Last 24 hours) at 01/18/2022 0655 Last data filed at 01/18/2022 0600 Gross per 24 hour  Intake 1810.88 ml  Output 600 ml  Net 1210.88 ml   Filed Weights   01/16/22 0352 01/17/22 0348 01/18/22 0246  Weight: 57.3 kg 59.8 kg 60.6 kg   Physical Examination: General: elderly fm, critically ill, intubated on vent,  not responsive  HEENT: soft collar, ett in place  Neuro: downward gaze, pupils are responsive, not tracking  CV: RRR, s1 s2  PULM: BL vented breaths  GI: no distended  Extremities: no edema  Skin: no rash  -  Resolved Hospital Problem List:  AKI> Shock liver  Hypophosphatemia has been repleted  Assessment & Plan:   S/p OOH cardiac arrest -supportive care   Afib with RVR Systolic CHF - remains on metoprolol  -Deferring systemic anticoagulation, on subcutaneous heparin prophylaxis  Anoxic brain injury > confirmed on MRI brain No change in neurological status off sedating medication for several days, restarted fentanyl 2/1 -continue low dose fentanyl - prognosis unfortunately poor.  - plans for comfort care transition Sunday at noon per palliative discussions   Sepsis due to UTI, no organism specified> improved Left upper extremity cellulitis S/p ceftriaxone x 7-day course.  Low-grade fever noted 1/27 with mild leukocytosis, no new infiltrate on CXR. 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  Hemoglobin 7.9 on 1/31 -defering any blood draws   DM2 with hyperglycemia -stop ssi with plans for comfort   Failure to thrive Dementia - TF continued  - until planned extubation   Hypernatremia improved on free water -  continue free water   GOC Continue management at this time.  Per palliative discussions we will plan for comfort care transitions on Sunday    Best Practice (right click and "Reselect all SmartList Selections" daily)   Diet/type: tubefeeds DVT prophylaxis: prophylactic heparin  GI prophylaxis: PPI Lines: N/A Foley:  Yes, and it is still  needed Code Status:  limited : no CPR, vent OK Last date of multidisciplinary goals of care discussion 2/1.  See above   Garner Nash, DO [place] [facility] 01/18/2022 6:55 AM

## 2022-01-19 DIAGNOSIS — I469 Cardiac arrest, cause unspecified: Secondary | ICD-10-CM | POA: Diagnosis not present

## 2022-01-19 NOTE — Progress Notes (Signed)
NAME:  TYLYN STANKOVICH, MRN:  [id number], DOB:  July 18, 1927, LOS: 34 ADMISSION DATE:  01/01/2022, CONSULTATION DATE:  1/19 REFERRING MD:  Kathrynn Humble, CHIEF COMPLAINT:  Out of hospital cardiac arrest   History of Present Illness:  88 y/o female with advanced dementia had an out of hospital cardiac arrest requiring CPR for around 15 minutes.  Initial rhythm was PEA.    Pertinent Medical History:  Allergic rhinitis Arthritis Coronary artery disease Dilated cardiomyopathy with systolic heart failure > LVEF 40-45%, RV function and RVSP normal, mild MR CAD DM2 Lung cancer > managed with curative radiotherapy 2013 Osteopenia Thought to have cryptogenic organizing pneumonia in 2009, formerly followed by Dr. Joya Gaskins  [facility] Events: Including procedures, antibiotic start and stop dates in addition to other pertinent events   1/19 admission 1/23 started metoprolol 1/24 meeting with family: palliative medicine; MRI c/f anoxic brain injury 1/26  1/3 Remains partial code (intubation only, no CPR/ACLS/defib)  Interim History / Subjective:   Critically ill, eldlery, intubated on life support. She does have some spontaneous movements but otherwise nothing   Objective:  Blood pressure (!) 103/51, pulse 93, temperature 98.4 F (36.9 C), temperature source Oral, resp. rate (!) 9, height 5\' 4"  (1.626 m), weight 61.9 kg, SpO2 100 %.    Vent Mode: PRVC FiO2 (%):  [40 %] 40 % Set Rate:  [16 bmp] 16 bmp Vt Set:  [430 mL] 430 mL PEEP:  [5 cmH20] 5 cmH20 Plateau Pressure:  [13 cmH20-17 cmH20] 13 cmH20   Intake/Output Summary (Last 24 hours) at 01/19/2022 0734 Last data filed at 01/19/2022 0700 Gross per 24 hour  Intake 1520.01 ml  Output 700 ml  Net 820.01 ml   Filed Weights   01/17/22 0348 01/18/22 0246 01/19/22 0400  Weight: 59.8 kg 60.6 kg 61.9 kg   Physical Examination: General: elderly fm , critically ill, intubated on life support   HEENT: soft collar, ett in place  Neuro:  downward gaze, no response to stimuli  CV: RRR, s1 s2  PULM: BL vented breaths  GI: non-distended  Extremities: no edema  Skin: no rash  -  Resolved Hospital Problem List:  AKI> Shock liver  Hypophosphatemia has been repleted  Assessment & Plan:   S/p OOH cardiac arrest - supportive care  Afib with RVR Systolic CHF - heparin subq ppx  - on metoprolol   Anoxic brain injury > confirmed on MRI brain No change in neurological status off sedating medication for several days, restarted fentanyl 2/1 - low dose fent for comfort  - no meaningful neuro recovery   Sepsis due to UTI, no organism specified> improved Left upper extremity cellulitis S/p ceftriaxone x 7-day course.  Low-grade fever noted 1/27 with mild leukocytosis, no new infiltrate on CXR. 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  Hemoglobin 7.9 on 1/31 - deferring blood draws   DM2 with hyperglycemia -stopped SSI   Failure to thrive Dementia - TF continued until plans for comfort care transition   Hypernatremia improved on free water - continue free water   [place] for comfort care transition on Sunday  Consult to palliative care  Best Practice (right click and "Reselect all SmartList Selections" daily)  Diet/type: tubefeeds DVT prophylaxis: prophylactic heparin  GI prophylaxis: PPI Lines: N/A Foley:  Yes, and it is still needed Code Status:  limited : no CPR, vent OK Last date of multidisciplinary goals of care discussion 2/1.  See above   Garner Nash, DO [place] [facility] 01/19/2022 7:34 AM

## 2022-01-20 DIAGNOSIS — I469 Cardiac arrest, cause unspecified: Secondary | ICD-10-CM | POA: Diagnosis not present

## 2022-01-20 MED ORDER — MIDAZOLAM HCL 2 MG/2ML IJ SOLN
2.0000 mg | INTRAMUSCULAR | Status: DC | PRN
  Administered 2022-01-20: 4 mg via INTRAVENOUS

## 2022-01-20 MED ORDER — GLYCOPYRROLATE 0.2 MG/ML IJ SOLN: 0.2000 mg | INTRAMUSCULAR | Status: DC | PRN

## 2022-01-20 MED ORDER — ACETAMINOPHEN 650 MG RE SUPP: 650.0000 mg | Freq: Four times a day (QID) | RECTAL | Status: DC | PRN

## 2022-01-20 MED ORDER — POLYVINYL ALCOHOL 1.4 % OP SOLN
1.0000 [drp] | Freq: Four times a day (QID) | OPHTHALMIC | Status: DC | PRN
  Filled 2022-01-20: qty 15

## 2022-01-20 MED ORDER — MORPHINE SULFATE (PF) 2 MG/ML IV SOLN: 2.0000 mg | INTRAVENOUS | Status: DC | PRN

## 2022-01-20 MED ORDER — DEXTROSE 5 % IV SOLN: INTRAVENOUS | Status: DC

## 2022-01-20 MED ORDER — MORPHINE 100MG IN NS 100ML (1MG/ML) PREMIX INFUSION
0.0000 mg/h | INTRAVENOUS | Status: DC
  Administered 2022-01-20 (×2): 20 mg/h via INTRAVENOUS
  Administered 2022-01-20: 5 mg/h via INTRAVENOUS
  Filled 2022-01-20 (×3): qty 100

## 2022-01-20 MED ORDER — MORPHINE BOLUS VIA INFUSION
5.0000 mg | INTRAVENOUS | Status: DC | PRN
  Filled 2022-01-20: qty 5

## 2022-01-20 MED ORDER — GLYCOPYRROLATE 1 MG PO TABS: 1.0000 mg | ORAL_TABLET | ORAL | Status: DC | PRN

## 2022-01-20 MED ORDER — DIPHENHYDRAMINE HCL 50 MG/ML IJ SOLN: 25.0000 mg | INTRAMUSCULAR | Status: DC | PRN

## 2022-01-20 MED ORDER — ACETAMINOPHEN 325 MG PO TABS: 650.0000 mg | ORAL_TABLET | Freq: Four times a day (QID) | ORAL | Status: DC | PRN

## 2022-01-20 NOTE — Progress Notes (Signed)
PCCM:  I met with family at bedside.   Orders placed for CCM withdrawal of care.   Morphine and versed for comfort  Garner Nash, DO [place] [facility] 01/20/2022 12:07 PM

## 2022-01-20 NOTE — Progress Notes (Signed)
°   01/20/22 1155  Clinical Encounter Type  Visited With Family  Visit Type Initial;Spiritual support  Referral From Chaplain  Consult/Referral To None   Chaplain responded a call for prayer. Gathered around the bed of patient and prayed for patient and for the family as life support is removed. Stayed for a short time after prayer and listened to family.  Family was appreciative of chaplain support. I advised patient's daughter if they would like a chaplain to return to let the nurse know and someone will return.   [place] Resident [facility] [phone]

## 2022-01-20 NOTE — Progress Notes (Signed)
NAME:  Joan Hunter, MRN:  [id number], DOB:  03/15/1927, LOS: 34 ADMISSION DATE:  12/31/2021, CONSULTATION DATE:  1/19 REFERRING MD:  Kathrynn Humble, CHIEF COMPLAINT:  Out of hospital cardiac arrest   History of Present Illness:  88 y/o female with advanced dementia had an out of hospital cardiac arrest requiring CPR for around 15 minutes.  Initial rhythm was PEA.    Pertinent Medical History:  Allergic rhinitis Arthritis Coronary artery disease Dilated cardiomyopathy with systolic heart failure > LVEF 40-45%, RV function and RVSP normal, mild MR CAD DM2 Lung cancer > managed with curative radiotherapy 2013 Osteopenia Thought to have cryptogenic organizing pneumonia in 2009, formerly followed by Dr. Joya Gaskins  [facility] Events: Including procedures, antibiotic start and stop dates in addition to other pertinent events   1/19 admission 1/23 started metoprolol 1/24 meeting with family: palliative medicine; MRI c/f anoxic brain injury 1/26  1/3 Remains partial code (intubation only, no CPR/ACLS/defib)  Interim History / Subjective:   Remains on vent support awaiting family decision   Objective:  Blood pressure 131/71, pulse (!) 109, temperature 98.1 F (36.7 C), temperature source Oral, resp. rate (!) 23, height 5\' 4"  (1.626 m), weight 61.8 kg, SpO2 100 %.    Vent Mode: PRVC FiO2 (%):  [40 %] 40 % Set Rate:  [16 bmp] 16 bmp Vt Set:  [430 mL] 430 mL PEEP:  [5 cmH20] 5 cmH20 Plateau Pressure:  [15 cmH20-18 cmH20] 15 cmH20   Intake/Output Summary (Last 24 hours) at 01/20/2022 0109 Last data filed at 01/20/2022 0600 Gross per 24 hour  Intake 1265.03 ml  Output 650 ml  Net 615.03 ml   Filed Weights   01/18/22 0246 01/19/22 0400 01/20/22 0500  Weight: 60.6 kg 61.9 kg 61.8 kg   Physical Examination: General: elderly FM, intubated on life support  HEENT: soft collar, ett in pace  Neuro: downward gaze, unresponsive on vent  CV: RRR, s1 s2  PULM: BL vented breaths   GI: non-distended  Extremities:no edmea Skin: no rash  -  Resolved Hospital Problem List:  AKI> Shock liver  Hypophosphatemia has been repleted  Assessment & Plan:   S/p OOH cardiac arrest - supportive care   Afib with RVR Systolic CHF - sub q heparin   Anoxic brain injury > confirmed on MRI brain No change in neurological status off sedating medication for several days, restarted fentanyl 2/1 - low dose fent for comfort   Sepsis due to UTI, no organism specified> improved Left upper extremity cellulitis S/p ceftriaxone x 7-day course.  Low-grade fever noted 1/27 with mild leukocytosis, no new infiltrate on CXR. 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  Hemoglobin 7.9 on 1/31 - deferring blood draws  DM2 with hyperglycemia -stopped SSI   Failure to thrive Dementia - continue TF until comfort care transition   Hypernatremia improved on free water Continue free water   GOC Planned comfort care transition today  I will try to reach out to family this morning.  I called yesterday with no answer Hopefully they will show up to the hospital today   Best Practice (right click and "Reselect all SmartList Selections" daily)   Diet/type: tubefeeds DVT prophylaxis: prophylactic heparin  GI  prophylaxis: PPI Lines: N/A Foley:  Yes, and it is still needed Code Status:  limited : no CPR, vent OK Last date of multidisciplinary goals of care discussion 2/1.  See above   Garner Nash, DO [place] [facility] 01/20/2022 7:28 AM

## 2022-01-20 NOTE — Procedures (Signed)
Extubation Procedure Note  Patient Details:   Name: Joan Hunter DOB: [birth year] MRN: [id number]   Airway Documentation:    Vent end date: 01/20/22 Vent end time: 1330   Evaluation  O2 sats: stable throughout Complications: No apparent complications Patient did tolerate procedure well. Bilateral Breath Sounds: Diminished   No  Patient was extubated per end of life care withdrawal order. RN at bedside with RT and family was waiting in the hallway during extubation. Family returned to room immediately following extubation.  Tiburcio Bash 01/20/2022, 1:35 PM

## 2022-02-13 NOTE — Progress Notes (Signed)
Auscultated no breath sounds or heart sounds for patient. Witnessed TOD at 0100 02/03/22 with Manual Meier, RN. Wasted 68mL Morphine in med room stericycle with Manual Meier, RN.   Family notified of TOD. Family took pt belongings home with them previously.  Attending physician notified.

## 2022-02-13 NOTE — Death Summary Note (Signed)
DEATH SUMMARY   Patient Details  Name: Joan Hunter MRN: [id number] DOB: 1927/02/10  Admission/Discharge Information   Admit Date:  2022-02-02  Date of Death: Date of Death: 2022/02/20  Time of Death: Time of Death: 0100  Length of Stay: 02-Apr-2023  Referring Physician: [name], MD   Reason(s) for Hospitalization/ Cause of death  OO[facility] cardiac arrest Severe anoxic brain injury Dementia Frail elderly Severe protein calorie malnutrition Prior lung cancer DM2 Post arrest ATN Post arrest shock  Brief Hospital Course (including significant findings, care, treatment, and services provided and events leading to death)  88 year old female with past medical history as below, which is significant for dementia, lung cancer status post curative radiotherapy, coronary artery disease, dilated cardiomyopathy with previous ejection fraction 15% now resolved, hypertension, and diabetes mellitus.  The patient is unable to provide history so much is taken from her daughter who is at bedside at the time of evaluation.  At baseline the patient dementia sounds to be quite advanced.  She is only able to minimally assist with activities of daily living and her oral intake is fallen off quite a bit.  She was sleeping much more than normal in the days leading up to her presentation on 2023/02/02.  Earlier that day she presented to the neurology office for a scheduled office visit regarding dementia.  Her blood pressure was noted to be quite low at that visit with systolic in the 01X.  She was referred to her PCPs office where she did go, however, it is unclear what was done.   Later that evening after returning home she became unresponsive.  Family called 3 and during the telephone conversation family noticed the patient stopped breathing and lost pulses.  First responders arrived several minutes later and began CPR for approximately 15 minutes prior to ROSC.  Initial rhythm was PEA.  Upon arrival to the emergency  department the patient was endotracheally intubated.  She was started on norepinephrine infusion as well as bolused with crystalloid 2.5 L.  Laboratory evaluation significant for potassium 3.3, CO2 11, glucose 359, creatinine 2. 5, lactic acid greater than 9.  PCCM asked to admit.  Patient was supported for 17 days on mechanical ventilation, antibiotics with no meaningful return of consciousness.  After multiple family discussions, patient was taken off ventilator and allowed to pass in peace.  Pertinent Labs and Studies  Significant Diagnostic Studies DG Abdomen 1 View  Result Date: Feb 02, 2022 CLINICAL DATA:  Status post orogastric tube placement. EXAM: ABDOMEN - 1 VIEW COMPARISON:  None. FINDINGS: Numerous overlying radiopaque cardiac lead wires are seen. A nasogastric tube is noted with its distal tip overlying the lateral aspect of the left upper quadrant. The distal side hole sits approximately 7.9 cm distal to the expected region of the gastroesophageal junction. The bowel gas pattern is normal. No radio-opaque calculi or other significant radiographic abnormality are seen. IMPRESSION: Nasogastric tube positioning, as described above. Electronically Signed   By: Virgina Norfolk M.D.   On: 02/02/2022 20:57   CT Head Wo Contrast  Result Date: 02-02-22 CLINICAL DATA:  Mental status change, post arrest.  Neck trauma. EXAM: CT HEAD WITHOUT CONTRAST CT CERVICAL SPINE WITHOUT CONTRAST TECHNIQUE: Multidetector CT imaging of the head and cervical spine was performed following the standard protocol without intravenous contrast. Multiplanar CT image reconstructions of the cervical spine were also generated. RADIATION DOSE REDUCTION: This exam was performed according to the departmental dose-optimization program which includes automated exposure control, adjustment of the mA  and/or kV according to patient size and/or use of iterative reconstruction technique. COMPARISON:  04/22/2014. FINDINGS: CT HEAD  FINDINGS Brain: No acute intracranial hemorrhage, midline shift or mass effect. No extra-axial fluid collection. Subcortical and periventricular white matter hypodensities are seen bilaterally. No hydrocephalus. Mild atrophy is noted. Senescent calcifications are present in the basal ganglia. Vascular: Atherosclerotic calcification of the vertebral arteries and carotid siphons. No hyperdense vessel. Skull: Normal. Negative for fracture or focal lesion. Sinuses/Orbits: No acute finding. Other: Tubes are present in the oral cavity. CT CERVICAL SPINE FINDINGS Alignment: There is mild anterolisthesis at C3-C4 and C4-C5. Skull base and vertebrae: No acute fracture. No primary bone lesion or focal pathologic process. Soft tissues and spinal canal: No prevertebral fluid or swelling. No visible canal hematoma. Disc levels: Intervertebral disc space narrowing, osteophyte formation, and facet arthropathy is present at multiple levels resulting in no significant spinal canal stenosis. Moderate to severe neural foraminal stenosis bilaterally. Upper chest: Apical pleural scarring is noted on the left. Other: Tubes are presen in the oral cavity. IMPRESSION: 1. No acute intracranial process. 2. Atrophy with chronic microvascular ischemic changes. 3. Degenerative changes in the cervical spine with no evidence of acute fracture. Electronically Signed   By: Brett Fairy M.D.   On: 01/09/2022 22:58   CT Cervical Spine Wo Contrast  Result Date: 01/02/2022 CLINICAL DATA:  Mental status change, post arrest.  Neck trauma. EXAM: CT HEAD WITHOUT CONTRAST CT CERVICAL SPINE WITHOUT CONTRAST TECHNIQUE: Multidetector CT imaging of the head and cervical spine was performed following the standard protocol without intravenous contrast. Multiplanar CT image reconstructions of the cervical spine were also generated. RADIATION DOSE REDUCTION: This exam was performed according to the departmental dose-optimization program which includes automated  exposure control, adjustment of the mA and/or kV according to patient size and/or use of iterative reconstruction technique. COMPARISON:  04/22/2014. FINDINGS: CT HEAD FINDINGS Brain: No acute intracranial hemorrhage, midline shift or mass effect. No extra-axial fluid collection. Subcortical and periventricular white matter hypodensities are seen bilaterally. No hydrocephalus. Mild atrophy is noted. Senescent calcifications are present in the basal ganglia. Vascular: Atherosclerotic calcification of the vertebral arteries and carotid siphons. No hyperdense vessel. Skull: Normal. Negative for fracture or focal lesion. Sinuses/Orbits: No acute finding. Other: Tubes are present in the oral cavity. CT CERVICAL SPINE FINDINGS Alignment: There is mild anterolisthesis at C3-C4 and C4-C5. Skull base and vertebrae: No acute fracture. No primary bone lesion or focal pathologic process. Soft tissues and spinal canal: No prevertebral fluid or swelling. No visible canal hematoma. Disc levels: Intervertebral disc space narrowing, osteophyte formation, and facet arthropathy is present at multiple levels resulting in no significant spinal canal stenosis. Moderate to severe neural foraminal stenosis bilaterally. Upper chest: Apical pleural scarring is noted on the left. Other: Tubes are presen in the oral cavity. IMPRESSION: 1. No acute intracranial process. 2. Atrophy with chronic microvascular ischemic changes. 3. Degenerative changes in the cervical spine with no evidence of acute fracture. Electronically Signed   By: Brett Fairy M.D.   On: 01/11/2022 22:58   MR BRAIN WO CONTRAST  Result Date: 01/08/2022 CLINICAL DATA:  Dementia with cardiac arrest.  CPR for 15 minutes EXAM: MRI HEAD WITHOUT CONTRAST TECHNIQUE: Multiplanar, multiecho pulse sequences of the brain and surrounding structures were obtained without intravenous contrast. COMPARISON:  Head CT from 5 days ago FINDINGS: Brain: Perirolandic and occipital cortex  diffusion hyperintensity which is fairly symmetric. Brain atrophy especially affecting the temporal lobes, correlating with history of dementia.  Chronic small vessel ischemia in the periventricular white matter. Increased STIR signal the bilateral sulci. No hemorrhage on admission CT or chart indication of meningitis, suspect this is from increased FiO2 or other artifact in this intubated patient. Vascular: Normal flow voids Skull and upper cervical spine: Normal marrow signal Sinuses/Orbits: Negative IMPRESSION: 1. Restricted diffusion in the bilateral perirolandic and occipital cortex, a global anoxic pattern of ischemia. 2. Brain atrophy in keeping with history of dementia. Electronically Signed   By: Jorje Guild M.D.   On: 01/08/2022 11:50   DG Chest Port 1 View  Result Date: 01/12/2022 CLINICAL DATA:  Acute respiratory failure. EXAM: PORTABLE CHEST 1 VIEW COMPARISON:  January 10, 2022. FINDINGS: Patient is rotated with chin down positioning limiting evaluation of the left lung apex. Endotracheal tube appears unchanged in positioning but evaluation is limited by positioning. Nasogastric tube courses below the diaphragm with side port overlying the stomach and tip obscured by collimation. The heart size and mediastinal contours appear unchanged. Tortuous calcified thoracic aorta. Interval increase in the right suprahilar opacity with stable trace right pleural effusion. Similar chronic lung changes including hyperinflation and bronchitic change. The visualized skeletal structures are unchanged. IMPRESSION: 1. Stable trace right pleural effusion with interval increase in right suprahilar opacity which may reflect superimposed infection versus artifact from positioning. 2. Stable chronic lung changes including hyperinflation and bronchitic change. Electronically Signed   By: Dahlia Bailiff M.D.   On: 01/12/2022 08:07   DG CHEST PORT 1 VIEW  Result Date: 01/10/2022 CLINICAL DATA:  Respiratory failure  EXAM: PORTABLE CHEST 1 VIEW COMPARISON:  12/20/2021 FINDINGS: Nasogastric tube extends beyond the inferior aspect of the film. Patient rotated right. Mild cardiomegaly. Tortuous thoracic aorta. Hyperinflation. Interstitial thickening. Extubation. Right suprahilar opacity is relatively similar. Mild right-sided pleural thickening versus trace fluid. No overt congestive failure. IMPRESSION: Right suprahilar opacity and trace right pleural fluid or thickening. The suprahilar opacity is similar to 01/01/2022 but progressive compared to 05/01/2015. This could represent progressive scarring or superimposed infection. Underlying hyperinflation and interstitial thickening, consistent with COPD/chronic bronchitis. Interval extubation. Electronically Signed   By: Abigail Miyamoto M.D.   On: 01/10/2022 08:57   DG Chest Port 1 View  Result Date: 01/09/2022 CLINICAL DATA:  Cardiac arrest. EXAM: PORTABLE CHEST 1 VIEW COMPARISON:  May 01, 2015 FINDINGS: Multiple overlying radiopaque cardiac lead wires and tags are seen. An endotracheal tube is seen with its distal tip approximately 3.5 cm from the carina. A nasogastric tube is noted with its distal end extending into the body of the stomach. Moderate to marked severity linear atelectasis and/or scarring is seen within the right upper lobe. This is increased in severity when compared to the prior study. There is no evidence of a pleural effusion or pneumothorax. The heart size and mediastinal contours are within normal limits. No acute osseous abnormality is identified. IMPRESSION: 1. Endotracheal tube and nasogastric tube in good position. 2. Moderate to marked severity right upper lobe linear atelectasis and/or scarring, increased in severity when compared to the prior study. Electronically Signed   By: Virgina Norfolk M.D.   On: 12/29/2021 19:58   EEG adult  Result Date: 01/04/2022 Mickie Hillier, MD     01/04/2022  1:06 PM Telespecialist EEG Report Routine EEG EEG Date:   01/04/2022 EEG Read Date: 01/04/2022 Duration: 1:34:47 (1hr 55min) Clinical Indication: altered mental status, suspected seizures EEG Procedure: This is a digitally recorded routine EEG electroencephalogram. The international 10-20 electrode placement system is used for scalp  electrode placement. Eighteen channels of scalp EEG are recorded. The data are stored digitally and reviewed in reformatted montages for optimal display. EEG Description: This EEG is not well organized.  There is prominent EMG artifact throughout the study precluding full evaluation of underlying EEG rhythms.  During the moments of no artifact, the background consists of voltage attenuated delta frequencies.  No definite epileptiform discharges or electrographic or electroclinical seizures were captured. EKG: The EKG rhythm strip demonstrated NSR at 96 bpm. Activating procedures:  Photic stimulation was not performed.  Hyperventilation was not performed. EEG Classification: Abnormal  Severe background slowing, generalized Voltage attenuated EMG artifact/motion limited EEG Interpretation: This routine EEG recorded in the comatose state is abnormal.  The above findings suggest severe diffuse cerebral dysfunction and/or sedative effect.  EMG artifact precluded full evaluation of underlying eeg rhythms. No definite epileptiform discharges or electrographic or electroclinical seizures were captured.  Overnight EEG with video  Result Date: 01/05/2022 Samuella Cota, MD     01/05/2022  7:34 AM EEG Procedure CPT/Type of Study: 95974; 24hr EEG with video Referring Provider: Verlee Monte Primary Neurological Diagnosis: cardiac arrest History: This is a 88 yr old patient, undergoing an EEG to evaluate for cardiac arrest. Clinical State: comatose Technical Description: The EEG was performed using standard setting per the guidelines of American Clinical Neurophysiology Society (ACNS). A minimum of 21 electrodes were placed on scalp according to the International  10-20 or/and 10-10 Systems. Supplemental electrodes were placed as needed. Single EKG electrode was also used to detect cardiac arrhythmia. Patient's behavior was continuously recorded on video simultaneously with EEG. A minimum of 16 channels were used for data display. Each epoch of study was reviewed manually daily and as needed using standard referential and bipolar montages. Computerized quantitative EEG analysis (such as compressed spectral array analysis, trending, automated spike & seizure detection) were used as indicated. Day 1: from 1117 01/04/22 to 0730 01/05/22 EEG Description: Overall Amplitude: suppressed Predominant Frequency: The background activity showed background suppression Superimposed Frequencies: none The background was symmetric Background Abnormalities: complete background suppression Rhythmic or periodic pattern: No Epileptiform activity: no Electrographic seizures: no Events: no Breach rhythm: no Reactivity: Absent Stimulation procedures: Hyperventilation: not done Photic stimulation: no change Sleep Background: none EKG: abnormal EKG rhythm Impression: This was a markedly abnormal continuous video EEG due to complete background suppression and absent reactivity, indicative of a severe anoxic injury pattern.   ECHOCARDIOGRAM COMPLETE  Result Date: 01/04/2022    ECHOCARDIOGRAM REPORT   Patient Name:   MARJIE CHEA Date of Exam: 01/04/2022 Medical Rec #:  [id number]          Height:       64.0 in Accession #:    [id number]         Weight:       106.5 lb Date of Birth:  1927-12-01          BSA:          1.497 m Patient Age:    88 years           BP:           139/99 mmHg Patient Gender: F                  HR:           96 bpm. Exam Location:  Inpatient Procedure: 2D Echo, Cardiac Doppler and Color Doppler Indications:    Cardiac arrest  History:  Patient has no prior history of Echocardiogram examinations.                 Cardiomyopathy and CHF, CAD, COPD, Mitral Valve Disease;  Risk                 Factors:Diabetes and Hypertension.  Sonographer:    Glo Herring Referring Phys: [place]  1. Left ventricular ejection fraction, by estimation, is 40 to 45%. The left ventricle has mildly decreased function. The left ventricle demonstrates global hypokinesis. Left ventricular diastolic parameters are consistent with Grade I diastolic dysfunction (impaired relaxation).  2. Right ventricular systolic function is normal. The right ventricular size is normal. Tricuspid regurgitation signal is inadequate for assessing PA pressure.  3. The mitral valve is grossly normal. Mild mitral valve regurgitation.  4. Focal aortic valve calcification. The aortic valve is grossly normal. Aortic valve regurgitation is not visualized.  5. The inferior vena cava is normal in size with greater than 50% respiratory variability, suggesting right atrial pressure of 3 mmHg. Comparison(s): No prior Echocardiogram. FINDINGS  Left Ventricle: Left ventricular ejection fraction, by estimation, is 40 to 45%. The left ventricle has mildly decreased function. The left ventricle demonstrates global hypokinesis. The left ventricular internal cavity size was normal in size. There is  no left ventricular hypertrophy. Left ventricular diastolic parameters are consistent with Grade I diastolic dysfunction (impaired relaxation). Right Ventricle: The right ventricular size is normal. No increase in right ventricular wall thickness. Right ventricular systolic function is normal. Tricuspid regurgitation signal is inadequate for assessing PA pressure. Left Atrium: Left atrial size was not well visualized. Right Atrium: Right atrial size was normal in size. Pericardium: There is no evidence of pericardial effusion. Mitral Valve: The mitral valve is grossly normal. Mild mitral valve regurgitation. Tricuspid Valve: The tricuspid valve is grossly normal. Tricuspid valve regurgitation is not demonstrated. Aortic  Valve: Focal aortic valve calcification. The aortic valve is grossly normal. Aortic valve regurgitation is not visualized. Aortic valve mean gradient measures 3.0 mmHg. Aortic valve peak gradient measures 5.4 mmHg. Aortic valve area, by VTI measures 2.46 cm. Pulmonic Valve: The pulmonic valve was not well visualized. Pulmonic valve regurgitation is not visualized. Aorta: The aortic root and ascending aorta are structurally normal, with no evidence of dilitation. Venous: The inferior vena cava is normal in size with greater than 50% respiratory variability, suggesting right atrial pressure of 3 mmHg. IAS/Shunts: No atrial level shunt detected by color flow Doppler.  LEFT VENTRICLE PLAX 2D LVOT diam:     2.00 cm   Diastology LV SV:         42        LV e' medial:    3.92 cm/s LV SV Index:   28        LV E/e' medial:  16.9 LVOT Area:     3.14 cm  LV e' lateral:   5.44 cm/s                          LV E/e' lateral: 12.2  RIGHT VENTRICLE RV Basal diam:  3.20 cm RV S prime:     7.83 cm/s RIGHT ATRIUM           Index RA Area:     11.00 cm RA Volume:   23.00 ml  15.37 ml/m  AORTIC VALVE  PULMONIC VALVE AV Area (Vmax):    1.91 cm     PV Vmax:       0.49 m/s AV Area (Vmean):   1.92 cm     PV Peak grad:  1.0 mmHg AV Area (VTI):     2.46 cm AV Vmax:           116.00 cm/s AV Vmean:          82.900 cm/s AV VTI:            0.170 m AV Peak Grad:      5.4 mmHg AV Mean Grad:      3.0 mmHg LVOT Vmax:         70.70 cm/s LVOT Vmean:        50.700 cm/s LVOT VTI:          0.133 m LVOT/AV VTI ratio: 0.78  AORTA Ao Root diam: 2.90 cm Ao Asc diam:  3.40 cm MITRAL VALVE MV Area (PHT): 4.83 cm    SHUNTS MV Decel Time: 157 msec    Systemic VTI:  0.13 m MV E velocity: 66.40 cm/s  Systemic Diam: 2.00 cm MV A velocity: 95.90 cm/s MV E/A ratio:  0.69 Mary Scientist, physiological signed by Phineas Inches Signature Date/Time: 01/04/2022/1:19:57 PM    Final     Microbiology No results found for this or any previous visit (from  the past 240 hour(s)).  Lab Basic Metabolic Panel: No results for input(s): NA, K, CL, CO2, GLUCOSE, BUN, CREATININE, CALCIUM, MG, PHOS in the last 168 hours. Liver Function Tests: No results for input(s): AST, ALT, ALKPHOS, BILITOT, PROT, ALBUMIN in the last 168 hours. No results for input(s): LIPASE, AMYLASE in the last 168 hours. No results for input(s): AMMONIA in the last 168 hours. CBC: No results for input(s): WBC, NEUTROABS, HGB, HCT, MCV, PLT in the last 168 hours. Cardiac Enzymes: No results for input(s): CKTOTAL, CKMB, CKMBINDEX, TROPONINI in the last 168 hours. Sepsis Labs: No results for input(s): PROCALCITON, WBC, LATICACIDVEN in the last 168 hours.   Candee Furbish 01/24/2022, 5:44 PM

## 2022-02-13 DEATH — deceased

## 2022-07-03 ENCOUNTER — Ambulatory Visit: Payer: Medicare PPO | Admitting: Physician Assistant

## 2022-07-05 ENCOUNTER — Ambulatory Visit: Payer: Medicare PPO | Admitting: Neurology
